# Patient Record
Sex: Female | Born: 1945 | Smoking: Never smoker
Health system: Southern US, Community
[De-identification: ages and names within clinical notes are randomized; demographics above are authoritative.]

## PROBLEM LIST (undated history)

## (undated) DIAGNOSIS — I10 Essential (primary) hypertension: Secondary | ICD-10-CM

## (undated) DIAGNOSIS — R011 Cardiac murmur, unspecified: Secondary | ICD-10-CM

## (undated) DIAGNOSIS — R112 Nausea with vomiting, unspecified: Secondary | ICD-10-CM

## (undated) DIAGNOSIS — B159 Hepatitis A without hepatic coma: Secondary | ICD-10-CM

## (undated) DIAGNOSIS — J309 Allergic rhinitis, unspecified: Secondary | ICD-10-CM

## (undated) DIAGNOSIS — C801 Malignant (primary) neoplasm, unspecified: Secondary | ICD-10-CM

## (undated) DIAGNOSIS — E119 Type 2 diabetes mellitus without complications: Secondary | ICD-10-CM

## (undated) DIAGNOSIS — H269 Unspecified cataract: Secondary | ICD-10-CM

## (undated) DIAGNOSIS — F32A Depression, unspecified: Secondary | ICD-10-CM

## (undated) DIAGNOSIS — M8580 Other specified disorders of bone density and structure, unspecified site: Secondary | ICD-10-CM

## (undated) DIAGNOSIS — E785 Hyperlipidemia, unspecified: Secondary | ICD-10-CM

## (undated) DIAGNOSIS — M1990 Unspecified osteoarthritis, unspecified site: Secondary | ICD-10-CM

## (undated) DIAGNOSIS — M810 Age-related osteoporosis without current pathological fracture: Secondary | ICD-10-CM

## (undated) DIAGNOSIS — Z9889 Other specified postprocedural states: Secondary | ICD-10-CM

## (undated) DIAGNOSIS — D649 Anemia, unspecified: Secondary | ICD-10-CM

## (undated) DIAGNOSIS — B029 Zoster without complications: Secondary | ICD-10-CM

## (undated) DIAGNOSIS — R319 Hematuria, unspecified: Secondary | ICD-10-CM

## (undated) DIAGNOSIS — N952 Postmenopausal atrophic vaginitis: Secondary | ICD-10-CM

## (undated) DIAGNOSIS — T7840XA Allergy, unspecified, initial encounter: Secondary | ICD-10-CM

## (undated) DIAGNOSIS — N393 Stress incontinence (female) (male): Secondary | ICD-10-CM

## (undated) DIAGNOSIS — K219 Gastro-esophageal reflux disease without esophagitis: Secondary | ICD-10-CM

## (undated) DIAGNOSIS — F329 Major depressive disorder, single episode, unspecified: Secondary | ICD-10-CM

## (undated) DIAGNOSIS — F419 Anxiety disorder, unspecified: Secondary | ICD-10-CM

## (undated) DIAGNOSIS — D696 Thrombocytopenia, unspecified: Secondary | ICD-10-CM

## (undated) HISTORY — DX: Unspecified cataract: H26.9

## (undated) HISTORY — PX: KNEE ARTHROSCOPY: SHX127

## (undated) HISTORY — DX: Hyperlipidemia, unspecified: E78.5

## (undated) HISTORY — DX: Anemia, unspecified: D64.9

## (undated) HISTORY — DX: Zoster without complications: B02.9

## (undated) HISTORY — DX: Cardiac murmur, unspecified: R01.1

## (undated) HISTORY — DX: Age-related osteoporosis without current pathological fracture: M81.0

## (undated) HISTORY — DX: Thrombocytopenia, unspecified: D69.6

## (undated) HISTORY — PX: CATARACT EXTRACTION W/ INTRAOCULAR LENS  IMPLANT, BILATERAL: SHX1307

## (undated) HISTORY — PX: UPPER GASTROINTESTINAL ENDOSCOPY: SHX188

## (undated) HISTORY — DX: Allergy, unspecified, initial encounter: T78.40XA

## (undated) HISTORY — DX: Allergic rhinitis, unspecified: J30.9

## (undated) HISTORY — DX: Type 2 diabetes mellitus without complications: E11.9

## (undated) HISTORY — DX: Depression, unspecified: F32.A

## (undated) HISTORY — DX: Major depressive disorder, single episode, unspecified: F32.9

## (undated) HISTORY — DX: Unspecified osteoarthritis, unspecified site: M19.90

## (undated) HISTORY — PX: COLONOSCOPY: SHX174

## (undated) HISTORY — DX: Gastro-esophageal reflux disease without esophagitis: K21.9

## (undated) HISTORY — DX: Hepatitis a without hepatic coma: B15.9

## (undated) HISTORY — DX: Other specified disorders of bone density and structure, unspecified site: M85.80

## (undated) HISTORY — DX: Postmenopausal atrophic vaginitis: N95.2

## (undated) HISTORY — DX: Other specified postprocedural states: Z98.890

## (undated) HISTORY — DX: Anxiety disorder, unspecified: F41.9

## (undated) HISTORY — DX: Stress incontinence (female) (male): N39.3

## (undated) HISTORY — DX: Malignant (primary) neoplasm, unspecified: C80.1

## (undated) HISTORY — DX: Essential (primary) hypertension: I10

## (undated) HISTORY — DX: Other specified postprocedural states: R11.2

## (undated) HISTORY — DX: Nausea with vomiting, unspecified: R11.2

## (undated) HISTORY — DX: Hematuria, unspecified: R31.9

---
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
## 2010-03-12 LAB — HM PAP SMEAR: HM Pap smear: NORMAL

## 2011-10-22 DIAGNOSIS — C44311 Basal cell carcinoma of skin of nose: Secondary | ICD-10-CM | POA: Insufficient documentation

## 2012-09-22 LAB — HM DEXA SCAN

## 2012-12-05 LAB — HM DIABETES EYE EXAM

## 2013-09-29 LAB — HM COLONOSCOPY

## 2013-10-25 LAB — HM MAMMOGRAPHY: HM Mammogram: NORMAL (ref 0–4)

## 2015-04-07 DIAGNOSIS — N952 Postmenopausal atrophic vaginitis: Secondary | ICD-10-CM

## 2015-04-07 DIAGNOSIS — N393 Stress incontinence (female) (male): Secondary | ICD-10-CM

## 2015-04-07 HISTORY — DX: Stress incontinence (female) (male): N39.3

## 2015-04-07 HISTORY — DX: Postmenopausal atrophic vaginitis: N95.2

## 2015-05-09 DIAGNOSIS — R319 Hematuria, unspecified: Secondary | ICD-10-CM

## 2015-05-09 HISTORY — DX: Hematuria, unspecified: R31.9

## 2015-06-04 DIAGNOSIS — B029 Zoster without complications: Secondary | ICD-10-CM

## 2015-06-04 HISTORY — DX: Zoster without complications: B02.9

## 2015-10-14 LAB — HEMOGLOBIN A1C: HEMOGLOBIN A1C: 7.1

## 2016-01-20 ENCOUNTER — Encounter: Payer: Self-pay | Admitting: *Deleted

## 2016-02-04 DIAGNOSIS — E785 Hyperlipidemia, unspecified: Secondary | ICD-10-CM

## 2016-02-04 DIAGNOSIS — E1169 Type 2 diabetes mellitus with other specified complication: Secondary | ICD-10-CM | POA: Insufficient documentation

## 2016-02-04 DIAGNOSIS — F4322 Adjustment disorder with anxiety: Secondary | ICD-10-CM | POA: Insufficient documentation

## 2016-02-04 DIAGNOSIS — E119 Type 2 diabetes mellitus without complications: Secondary | ICD-10-CM | POA: Insufficient documentation

## 2016-02-04 LAB — HEMOGLOBIN A1C: HEMOGLOBIN A1C: 6.4

## 2016-02-05 ENCOUNTER — Encounter: Payer: Self-pay | Admitting: Internal Medicine

## 2016-02-05 ENCOUNTER — Non-Acute Institutional Stay: Payer: Medicare Other | Admitting: Internal Medicine

## 2016-02-05 VITALS — BP 120/70 | HR 71 | Temp 98.0°F | Ht 65.0 in | Wt 136.0 lb

## 2016-02-05 DIAGNOSIS — F4322 Adjustment disorder with anxiety: Secondary | ICD-10-CM

## 2016-02-05 DIAGNOSIS — E785 Hyperlipidemia, unspecified: Secondary | ICD-10-CM | POA: Diagnosis not present

## 2016-02-05 DIAGNOSIS — C44311 Basal cell carcinoma of skin of nose: Secondary | ICD-10-CM

## 2016-02-05 DIAGNOSIS — E119 Type 2 diabetes mellitus without complications: Secondary | ICD-10-CM

## 2016-02-05 DIAGNOSIS — E1169 Type 2 diabetes mellitus with other specified complication: Secondary | ICD-10-CM

## 2016-02-05 MED ORDER — LOSARTAN POTASSIUM 25 MG PO TABS: 25.0000 mg | ORAL_TABLET | Freq: Two times a day (BID) | ORAL | 3 refills | Status: DC

## 2016-02-05 MED ORDER — ATORVASTATIN CALCIUM 20 MG PO TABS: 20.0000 mg | ORAL_TABLET | Freq: Every day | ORAL | 3 refills | Status: DC

## 2016-02-05 MED ORDER — FLUOXETINE HCL 40 MG PO CAPS: 40.0000 mg | ORAL_CAPSULE | Freq: Every day | ORAL | 3 refills | Status: DC
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
## 2016-02-06 ENCOUNTER — Encounter: Payer: Self-pay | Admitting: Internal Medicine

## 2016-02-06 LAB — BASIC METABOLIC PANEL
BUN: 20 mg/dL (ref 4–21)
Creatinine: 0.8 mg/dL (ref 0.5–1.1)
Glucose: 145 mg/dL
Potassium: 5.1 mmol/L (ref 3.4–5.3)
Sodium: 141 mmol/L (ref 137–147)

## 2016-02-06 LAB — LIPID PANEL
CHOLESTEROL: 160 mg/dL (ref 0–200)
HDL: 66 mg/dL (ref 35–70)
LDL CALC: 69 mg/dL
TRIGLYCERIDES: 121 mg/dL (ref 40–160)

## 2016-02-06 LAB — CBC AND DIFFERENTIAL
HEMATOCRIT: 39 % (ref 36–46)
Hemoglobin: 13 g/dL (ref 12.0–16.0)
PLATELETS: 343 10*3/uL (ref 150–399)
WBC: 8.5 10^3/mL

## 2016-02-06 LAB — HEPATIC FUNCTION PANEL
ALK PHOS: 59 U/L (ref 25–125)
ALT: 23 U/L (ref 7–35)
AST: 18 U/L (ref 13–35)

## 2016-02-07 ENCOUNTER — Encounter: Payer: Self-pay | Admitting: *Deleted

## 2016-02-15 ENCOUNTER — Encounter: Payer: Self-pay | Admitting: Internal Medicine

## 2016-05-15 ENCOUNTER — Ambulatory Visit: Payer: Self-pay | Admitting: Internal Medicine

## 2016-05-20 ENCOUNTER — Non-Acute Institutional Stay: Payer: Medicare Other | Admitting: Internal Medicine

## 2016-05-20 ENCOUNTER — Encounter: Payer: Self-pay | Admitting: Internal Medicine

## 2016-05-20 VITALS — BP 126/70 | HR 75 | Temp 98.6°F | Ht 65.0 in | Wt 140.0 lb

## 2016-05-20 DIAGNOSIS — E785 Hyperlipidemia, unspecified: Secondary | ICD-10-CM

## 2016-05-20 DIAGNOSIS — Z1231 Encounter for screening mammogram for malignant neoplasm of breast: Secondary | ICD-10-CM | POA: Diagnosis not present

## 2016-05-20 DIAGNOSIS — E119 Type 2 diabetes mellitus without complications: Secondary | ICD-10-CM | POA: Diagnosis not present

## 2016-05-20 DIAGNOSIS — E1169 Type 2 diabetes mellitus with other specified complication: Secondary | ICD-10-CM

## 2016-05-20 DIAGNOSIS — F4322 Adjustment disorder with anxiety: Secondary | ICD-10-CM | POA: Diagnosis not present

## 2016-05-20 DIAGNOSIS — Z1239 Encounter for other screening for malignant neoplasm of breast: Secondary | ICD-10-CM

## 2016-05-20 DIAGNOSIS — Z0000 Encounter for general adult medical examination without abnormal findings: Secondary | ICD-10-CM | POA: Diagnosis not present

## 2016-05-20 NOTE — Progress Notes (Signed)
Location:  Occupational psychologist of Service:  Clinic (12) Provider: Brysen Shankman L. Mariea Clonts, D.O., C.M.D.  Patient Care Team: Gayland Curry, DO as PCP - General (Geriatric Medicine)  Extended Emergency Contact Information Primary Emergency Contact: Harms,Chris Address: [phone]          [place], [place] [address] Johnnette Litter of [place] Phone: [phone] Relation: Son  Goals of Care: Advanced Directive information Advanced Directives 05/20/2016  Does Patient Have a Medical Advance Directive? Yes  Type of Advance Directive [employer]  Does patient want to make changes to medical advance directive? -  Copy of [place] in Chart? No - copy requested   Chief Complaint  Patient presents with  . Annual Exam    wellness exam, physical exam  . MMSE    30/30 passed clock    HPI: Patient is a 71 y.o. female seen in today for an annual wellness exam and physical.    It's taken a while to adjust to being here.  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    Had an episode last week where her heart started to thump very hard while on her ipad.  Lasted 10 seconds.  Happened again a few days later for a short time and stopped.  Took a xanax and it calmed her down.  No chest pain.  Is sob coming up the last hill.  Is prone to panic attacks.  Says [employer] bothers her often.  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  Last had xanax refilled in May.    Depression screen [facility] 2/9 05/20/2016 02/05/2016  Decreased Interest 0 0  Down, Depressed, Hopeless 0 0  PHQ - 2 Score 0 0    Fall Risk  05/20/2016 02/05/2016  Falls in the past year? No No    Health Maintenance  Topic Date Due  . Hepatitis C Screening  10-28-1945  . FOOT EXAM  10/21/1953  . OPHTHALMOLOGY EXAM  12/05/2013  . MAMMOGRAM  10/26/2015  . HEMOGLOBIN A1C  08/03/2016  . TETANUS/TDAP  04/06/2017  .  COLONOSCOPY  09/30/2023  . INFLUENZA VACCINE  Completed  . DEXA SCAN  Completed  . ZOSTAVAX  Completed  . PNA vac Low Risk Adult  Completed    Functional Status Survey: Is the patient deaf or have difficulty hearing?: No Does the patient have difficulty seeing, even when wearing glasses/contacts?: No (s/p cataract surgery) Does the patient have difficulty concentrating, remembering, or making decisions?: No Does the patient have difficulty walking or climbing stairs?: No Does the patient have difficulty dressing or bathing?: No Does the patient have difficulty doing errands alone such as visiting a doctor's office or shopping?: No Current Exercise Habits: Home exercise routine, Type of exercise: walking, Time (Minutes): > 60, Frequency (Times/Week): 7, Weekly Exercise (Minutes/Week): 0, Intensity: Moderate Exercise limited by: None identified (walking) Diet? diabetic Vision Screening Comments: Cataract surgery last year in [place]  Hearing: no problem Dentition: has had a lot of dental work, but no active problem Pain:  Arthritis pains   Past Medical History:  Diagnosis Date  . Allergic rhinitis   . Anxiety   . Atrophic vaginitis 2017  . Depression   . GERD (gastroesophageal reflux disease)   . Hematuria 05/09/2015   had two nonobstructing renal calculi  . Hepatitis A 1970s  . Hyperlipidemia   .  Osteopenia   . Shingles 06/04/2015  . SUI (stress urinary incontinence, female) 2017  . Thrombocytopenia ([place])     Past Surgical History:  Procedure Laterality Date  . CATARACT EXTRACTION W/ INTRAOCULAR LENS  IMPLANT, BILATERAL Bilateral   . KNEE ARTHROSCOPY  2004, 1990s  . TONSILLECTOMY AND ADENOIDECTOMY  1952    Family History  Problem Relation Age of Onset  . Heart disease Father     heart attack  1980  . Stroke Father     min- stroke  47  . Cancer Brother     colon (2004)    Social History   Social History  . Marital status: Married    Spouse name: N/A  .  Number of children: N/A  . Years of education: N/A   Social History Main Topics  . Smoking status: Never Smoker  . Smokeless tobacco: Never Used  . Alcohol use Yes     Comment: 1-2  . Drug use: Unknown  . Sexual activity: Not Asked   Other Topics Concern  . None   Social History Narrative   Social History     Marital status: Married, 1963           Spouse name: Elenore Rota                      Years of education:                 Number of children:   3              Occupational History     None on file: Admin, Art therapist      Social History Main Topics     Smoking status: Never Smoker                                                                  Smokeless tobacco: Never Used                         Alcohol use: Yes                 Comment: 1-2     Drug use: Not on file      Sexual activity: Not on file            Other Topics            Concern     None on file      Social History Narrative: Low fat & Diabetic diet, lives in a [place] (one story), 2 people and 2 pets( dog and cat), exercise occasional, Has living will, DNR form, POA/HPOA form.     None on file             reports that she has never smoked. She has never used smokeless tobacco. She reports that she drinks alcohol. Her drug history is not on file.  Allergies as of 05/20/2016      Reactions   Keflex [cephalexin]    Sulfa Antibiotics       Medication List       Accurate as of 05/20/16  3:37 PM. Always use your most recent med list.  ALPRAZolam 0.25 MG tablet Commonly known as:  XANAX Cut one 0.25 mg tablet in half as needed for anxiety.   aspirin EC 81 MG tablet Take 81 mg by mouth daily.   atorvastatin 20 MG tablet Commonly known as:  LIPITOR Take 1 tablet (20 mg total) by mouth daily.   FLUoxetine 40 MG capsule Commonly known as:  PROZAC Take 1 capsule (40 mg total) by mouth daily.   ibuprofen 200 MG tablet Commonly known as:  ADVIL,MOTRIN Take 200 mg by mouth daily as  needed for pain.   losartan 25 MG tablet Commonly known as:  COZAAR Take 1 tablet (25 mg total) by mouth 2 (two) times daily.   metFORMIN 500 MG 24 hr tablet Commonly known as:  GLUCOPHAGE-XR Take 1,000 mg by mouth.   omeprazole 20 MG capsule Commonly known as:  PRILOSEC Take 20 mg by mouth daily.   WOMENS 50+ MULTI VITAMIN/MIN PO Take by mouth.       Review of Systems:  Review of Systems  Constitutional: Negative for chills, fever and malaise/fatigue.  HENT: Negative for congestion and hearing loss.   Eyes: Negative for blurred vision.  Respiratory: Negative for cough and shortness of breath.   Cardiovascular: Positive for palpitations. Negative for chest pain, orthopnea, claudication, leg swelling and PND.  Gastrointestinal: Negative for abdominal pain, blood in stool, constipation, diarrhea and melena.  Genitourinary: Negative for dysuria, frequency and urgency.  Musculoskeletal: Positive for joint pain. Negative for falls.  Skin: Negative for itching and rash.  Neurological: Negative for dizziness, tingling, sensory change and loss of consciousness.  Endo/Heme/Allergies: Does not bruise/bleed easily.  Psychiatric/Behavioral: Negative for depression and memory loss. The patient is nervous/anxious. The patient does not have insomnia.     Physical Exam: Vitals:   05/20/16 1436  BP: 126/70  Pulse: 75  Temp: 98.6 F (37 C)  TempSrc: Oral  SpO2: 96%  Weight: 140 lb (63.5 kg)  Height: 5\' 5"  (1.651 m)   Body mass index is 23.3 kg/m. Physical Exam  Constitutional: She is oriented to person, place, and time. She appears well-developed and well-nourished. No distress.  HENT:  Head: Normocephalic and atraumatic.  Right Ear: External ear normal.  Left Ear: External ear normal.  Nose: Nose normal.  Mouth/Throat: Oropharynx is clear and moist. No oropharyngeal exudate.  Eyes: Conjunctivae and EOM are normal. Pupils are equal, round, and reactive to light.  Small  hyperpigmented spot just beneath pupil in right eye  Neck: Normal range of motion. Neck supple. No tracheal deviation present. No thyromegaly present.  Cardiovascular: Normal rate, regular rhythm, normal heart sounds and intact distal pulses.   Pulmonary/Chest: Effort normal and breath sounds normal. No respiratory distress.  Abdominal: Soft. Bowel sounds are normal. She exhibits no distension and no mass. There is no tenderness. There is no rebound and no guarding. No hernia.  Neurological: She is alert and oriented to person, place, and time.  Skin: Skin is warm and dry. Capillary refill takes less than 2 seconds.  Psychiatric: She has a normal mood and affect. Her behavior is normal. Judgment and thought content normal.    Labs reviewed: Basic Metabolic Panel:  Recent Labs  02/06/16  NA 141  K 5.1  BUN 20  CREATININE 0.8   Liver Function Tests:  Recent Labs  02/06/16  AST 18  ALT 23  ALKPHOS 59   No results for input(s): LIPASE, AMYLASE in the last 8760 hours. No results for input(s): AMMONIA in the last  8760 hours. CBC:  Recent Labs  02/06/16  WBC 8.5  HGB 13.0  HCT 39  PLT 343   Lipid Panel:  Recent Labs  02/06/16  CHOL 160  HDL 66  LDLCALC 69  TRIG 121   Lab Results  Component Value Date   HGBA1C 6.4 02/04/2016   Assessment/Plan 1. Annual physical exam - performed today - up to date except may be due for mammo and bone density--she will call back to our office with dates (given Dorothy's name) - MM DIGITAL SCREENING BILATERAL; Future  2. Medicare annual wellness visit, subsequent -performed today, see above  3. Type 2 diabetes mellitus without complication, without long-term current use of insulin (HCC) -well controlled with last hba1c 6.4 at endocrinology--followed there for foot exams and gets regular eye exams also  4. Hyperlipidemia associated with type 2 diabetes mellitus ([place]) -cont statin therapy with lipitor  5. Adjustment disorder with  anxious mood -has increased anxiety since moving and had a couple of spells of palpitations but heart regular on exam -we will call pt when EKG machine at [facility] is repaired so we can perform an EKG  6. Screening for breast cancer - MM DIGITAL SCREENING BILATERAL; Future and may also need her bone density  Finally got her records and will review for scanning.  Labs/tests ordered:   Orders Placed This Encounter  Procedures  . HM MAMMOGRAPHY    This external order was created through the Results Console.  Marland Kitchen HM DEXA SCAN    This external order was created through the Results Console.  . MM DIGITAL SCREENING BILATERAL    Standing Status:   Future    Standing Expiration Date:   07/20/2017    Scheduling Instructions:     Pt to call our office with last mammogram date (we have one from 8/16, but thinks maybe she had one in 2017)    Order Specific Question:   Reason for exam:    Answer:   screening for breast cancer    Order Specific Question:   Preferred imaging location?    Answer:   [facility]  . HM PAP SMEAR    This external order was created through the Results Console.  Marland Kitchen HM DIABETES EYE EXAM    This external order was created through the Results Console.  Marland Kitchen HM COLONOSCOPY    This external order was created through the Results Console.    Next appt:  When EKG machine fixed, then in 6 mos for med mgt  Breannah Kratt L. Ezzard Ditmer, D.O. [facility] [address], [place] [address] Cell Phone (Mon-Fri 8am-5pm):  [phone] On Call:  [phone] & follow prompts after 5pm & weekends Office Phone:  [phone] Office Fax:  [phone]

## 2016-05-20 NOTE — Patient Instructions (Addendum)
Call us with the dates for your mammogram and bone density--Kimberly [name] is our referral coordinator.   We will call you when we have a working EKG machine.

## 2016-05-29 ENCOUNTER — Other Ambulatory Visit: Payer: Self-pay | Admitting: Internal Medicine

## 2016-05-29 DIAGNOSIS — Z1231 Encounter for screening mammogram for malignant neoplasm of breast: Secondary | ICD-10-CM

## 2016-06-19 ENCOUNTER — Encounter: Payer: Self-pay | Admitting: Internal Medicine

## 2016-07-14 ENCOUNTER — Ambulatory Visit
Admission: RE | Admit: 2016-07-14 | Discharge: 2016-07-14 | Disposition: A | Discharge status: Home / Self Care | Payer: Medicare Other | Source: Ambulatory Visit | Attending: Internal Medicine | Admitting: Internal Medicine

## 2016-07-14 DIAGNOSIS — Z1231 Encounter for screening mammogram for malignant neoplasm of breast: Secondary | ICD-10-CM | POA: Diagnosis not present

## 2016-08-03 DIAGNOSIS — E119 Type 2 diabetes mellitus without complications: Secondary | ICD-10-CM | POA: Diagnosis not present

## 2016-08-03 DIAGNOSIS — E785 Hyperlipidemia, unspecified: Secondary | ICD-10-CM | POA: Diagnosis not present

## 2016-08-03 DIAGNOSIS — R030 Elevated blood-pressure reading, without diagnosis of hypertension: Secondary | ICD-10-CM | POA: Diagnosis not present

## 2016-08-03 DIAGNOSIS — E1169 Type 2 diabetes mellitus with other specified complication: Secondary | ICD-10-CM | POA: Diagnosis not present

## 2016-09-25 DIAGNOSIS — H26491 Other secondary cataract, right eye: Secondary | ICD-10-CM | POA: Diagnosis not present

## 2016-09-25 DIAGNOSIS — E119 Type 2 diabetes mellitus without complications: Secondary | ICD-10-CM | POA: Diagnosis not present

## 2016-09-25 DIAGNOSIS — H26492 Other secondary cataract, left eye: Secondary | ICD-10-CM | POA: Diagnosis not present

## 2016-11-18 ENCOUNTER — Non-Acute Institutional Stay: Payer: Medicare Other | Admitting: Internal Medicine

## 2016-11-18 ENCOUNTER — Encounter: Payer: Self-pay | Admitting: Internal Medicine

## 2016-11-18 VITALS — BP 150/78 | HR 80 | Temp 98.0°F | Wt 139.0 lb

## 2016-11-18 DIAGNOSIS — R457 State of emotional shock and stress, unspecified: Secondary | ICD-10-CM

## 2016-11-18 DIAGNOSIS — F4389 Other reactions to severe stress: Secondary | ICD-10-CM

## 2016-11-18 DIAGNOSIS — I10 Essential (primary) hypertension: Secondary | ICD-10-CM

## 2016-11-18 DIAGNOSIS — F4322 Adjustment disorder with anxiety: Secondary | ICD-10-CM | POA: Diagnosis not present

## 2016-11-18 MED ORDER — LOSARTAN POTASSIUM 25 MG PO TABS: ORAL_TABLET | ORAL | 3 refills | Status: DC

## 2016-11-18 NOTE — Progress Notes (Signed)
Location:  [place] of Service:  Clinic (12)  Provider: Lamichael Youkhana L. Mariea Clonts, D.O., C.M.D.  Code Status: DNR Goals of Care:  Advanced Directives 05/20/2016  Does Patient Have a Medical Advance Directive? Yes  Type of Advance Directive [place]  Does patient want to make changes to medical advance directive? -  Copy of [place] in Chart? No - copy requested     Chief Complaint  Patient presents with  . Acute Visit    discuss blood pressure    HPI: Patient is a 71 y.o. female seen today for an acute visit for stress and elevated BP.  She reports her husband is very demanding.  He's just fallen and broken his hip beneath the prosthesis.  Her stomach is in knots.  She's taking her alprazolam more and she does not want to take it b/c it makes her groggy.  She took a whole one yesterday.  She has asked her sons to take over.  She is tired.  She is crying all of the time.  She reports starting off sad and worried about him, and then her husband's way of treating her has been frustrating--he puts her down in front of others and says nasty things to her.  She is worried about what the Parkinson's is doing to his body and how he can't exercise.  Her BP was high yesterday to 180/100.  She was taking it more and more and it kept going up.  They have not yet joined a support group for Parkinson's.  She can't do anything on her own w/o him wanting to come along.  She wants to do things on her own sometimes.  She tells me she had a fall at the Parkinson's walk and another time heat stroke and he is not at all understanding.  Takes losartan 25mg  twice a day.  Took once at noon yesterday.    She was doing three days a week with Shirlean Mylar and daily walks with the dog.    Past Medical History:  Diagnosis Date  . Allergic rhinitis   . Anxiety   . Atrophic vaginitis 2017  . Depression   . GERD (gastroesophageal reflux disease)   . Hematuria 05/09/2015   had  two nonobstructing renal calculi  . Hepatitis A 1970s  . Hyperlipidemia   . Osteopenia   . Shingles 06/04/2015  . SUI (stress urinary incontinence, female) 2017  . Thrombocytopenia ([place])     Past Surgical History:  Procedure Laterality Date  . CATARACT EXTRACTION W/ INTRAOCULAR LENS  IMPLANT, BILATERAL Bilateral   . KNEE ARTHROSCOPY  2004, 1990s  . TONSILLECTOMY AND ADENOIDECTOMY  1952    Allergies  Allergen Reactions  . Keflex [Cephalexin]   . Sulfa Antibiotics     Allergies as of 11/18/2016      Reactions   Keflex [cephalexin]    Sulfa Antibiotics       Medication List       Accurate as of 11/18/16  3:03 PM. Always use your most recent med list.          ALPRAZolam 0.25 MG tablet Commonly known as:  XANAX Cut one 0.25 mg tablet in half as needed for anxiety.   aspirin EC 81 MG tablet Take 81 mg by mouth daily.   atorvastatin 20 MG tablet Commonly known as:  LIPITOR Take 1 tablet (20 mg total) by mouth daily.   FLUoxetine 40 MG capsule Commonly known as:  PROZAC Take 1 capsule (40 mg total) by mouth daily.   ibuprofen 200 MG tablet Commonly known as:  ADVIL,MOTRIN Take 200 mg by mouth daily as needed for pain.   losartan 25 MG tablet Commonly known as:  COZAAR Take 1 tablet (25 mg total) by mouth 2 (two) times daily.   metFORMIN 500 MG 24 hr tablet Commonly known as:  GLUCOPHAGE-XR Take 1,000 mg by mouth.   omeprazole 20 MG capsule Commonly known as:  PRILOSEC Take 20 mg by mouth daily.   WOMENS 50+ MULTI VITAMIN/MIN PO Take by mouth.       Review of Systems:  Review of Systems  Constitutional: Positive for malaise/fatigue. Negative for chills and fever.  HENT: Negative for congestion.   Eyes: Negative for blurred vision.  Respiratory: Negative for shortness of breath.   Cardiovascular: Negative for chest pain and palpitations.  Gastrointestinal: Negative for abdominal pain.  Genitourinary: Negative for dysuria.  Musculoskeletal:  Negative for falls.  Neurological: Negative for dizziness, loss of consciousness and weakness.  Psychiatric/Behavioral: Positive for depression. Negative for hallucinations, memory loss, substance abuse and suicidal ideas. The patient is nervous/anxious and has insomnia.     Health Maintenance  Topic Date Due  . Hepatitis C Screening  16-Oct-1945  . FOOT EXAM  10/[birth year]/1953  . OPHTHALMOLOGY EXAM  12/05/2013  . HEMOGLOBIN A1C  08/03/2016  . INFLUENZA VACCINE  11/04/2016  . TETANUS/TDAP  04/06/2017  . MAMMOGRAM  07/15/2018  . COLONOSCOPY  09/30/2023  . DEXA SCAN  Completed  . PNA vac Low Risk Adult  Completed    Physical Exam: Vitals:   11/18/16 1436  BP: (!) 150/78  Pulse: 80  Temp: 98 F (36.7 C)  TempSrc: Oral  SpO2: 98%  Weight: 139 lb (63 kg)   Body mass index is 23.13 kg/m. Physical Exam  Constitutional: She is oriented to person, place, and time. She appears well-developed and well-nourished. No distress.  Cardiovascular: Normal rate, regular rhythm and normal heart sounds.   Pulmonary/Chest: Effort normal and breath sounds normal. No respiratory distress.  Abdominal: Bowel sounds are normal.  Musculoskeletal: Normal range of motion.  Neurological: She is alert and oriented to person, place, and time.  Skin: Skin is warm and dry. Capillary refill takes less than 2 seconds.  Psychiatric:  Crying during visit, very stressed and upset    Labs reviewed: Basic Metabolic Panel:  Recent Labs  02/06/16  NA 141  K 5.1  BUN 20  CREATININE 0.8   Liver Function Tests:  Recent Labs  02/06/16  AST 18  ALT 23  ALKPHOS 59   No results for input(s): LIPASE, AMYLASE in the last 8760 hours. No results for input(s): AMMONIA in the last 8760 hours. CBC:  Recent Labs  02/06/16  WBC 8.5  HGB 13.0  HCT 39  PLT 343   Lipid Panel:  Recent Labs  02/06/16  CHOL 160  HDL 66  LDLCALC 69  TRIG 121   Lab Results  Component Value Date   HGBA1C 6.4 02/04/2016     Assessment/Plan 1. Uncontrolled hypertension Increase losartan to 50mg  in am, 25mg  in pm (was on 25mg  po bid) Let me know if staying up over 150/90 when checked 1 hr after taking morning pills - losartan (COZAAR) 25 MG tablet; Two tablets in the morning and one tablet in the evening  Dispense: 270 tablet; Refill: 3  2. Caregiver stress syndrome Parkinson's caregiver support group--sent message to Dr. Carles Collet for info and will call  pt with it when received Katy Fitch for counseling here  3. Adjustment disorder with anxious mood Parkinson's caregiver support group Katy Fitch for counseling here  Labs/tests ordered:  No new Next appt:  Call and come for routine visit as scheduled  Giovonnie Trettel L. Mikel Hardgrove, D.O. [facility] [address], [place] [address] Cell Phone (Mon-Fri 8am-5pm):  [phone] On Call:  [phone] & follow prompts after 5pm & weekends Office Phone:  [phone] Office Fax:  [phone]

## 2016-11-19 ENCOUNTER — Telehealth: Payer: Self-pay | Admitting: *Deleted

## 2016-11-19 NOTE — Telephone Encounter (Signed)
-----   Message from Gayland Curry, DO sent at 11/19/2016  8:11 AM EDT ----- Please call Mrs Stipes and let her know that there is a caregiver support group that will likely start in October.  Please provide the contact name below for her--Amy Gerrit Friends.  ----- Message ----- From: Ludwig Clarks, DO Sent: 11/19/2016   7:31 AM To: Gayland Curry, DO  The caregiver support group meets in conjunction with the regular one but generally is done every other month.  Last month, we lost our PD social worker who was running that but we hired a new one who actually used to help with the caregiver support group and she is going to be great!  She is set to start 9/10 but suspect we won't get her started with groups until October.  [facility] but I don't think caregiver group will meet until we get the new social worker.  Once our social worker starts though, she will be offering counseling too.  Amy Marriott at the [facility] runs the PD support groups (she is on epic and responds to staff message and email) and I'm sure that Mrs. Bryant is familiar with her.  I hate that he broke his hip again!  I saw that he was messing with his catheter and was cringing when I read then notes!!  Have a great day! Wells Guiles ----- Message ----- From: Gayland Curry, DO Sent: 11/18/2016   3:25 PM To: Eustace Quail Tat, DO  Hi Joellyn Haff just seen Mr. Motz' wife.  She is overwhelmed and frustrated after his second fall where he broke his left hip that had just been repaired.  He is in [facility] rehab.  She is interested in a Parkinson's caregiver support group.  Are you able to send me the contact info for this or ask your staff to send it to me or Mrs. Kollmann?     Thanks!     Tiffany

## 2016-11-19 NOTE — Telephone Encounter (Signed)
Spoke with patient and advised results, she will get more information and attend.

## 2016-11-22 DIAGNOSIS — I10 Essential (primary) hypertension: Secondary | ICD-10-CM | POA: Insufficient documentation

## 2016-11-22 DIAGNOSIS — R457 State of emotional shock and stress, unspecified: Secondary | ICD-10-CM | POA: Insufficient documentation

## 2016-11-23 DIAGNOSIS — L570 Actinic keratosis: Secondary | ICD-10-CM | POA: Diagnosis not present

## 2016-11-23 DIAGNOSIS — L821 Other seborrheic keratosis: Secondary | ICD-10-CM | POA: Diagnosis not present

## 2016-11-23 DIAGNOSIS — D229 Melanocytic nevi, unspecified: Secondary | ICD-10-CM | POA: Diagnosis not present

## 2016-11-30 ENCOUNTER — Telehealth: Payer: Self-pay | Admitting: *Deleted

## 2016-11-30 NOTE — Telephone Encounter (Signed)
Patient called and stated that she needs a prior authorization for her Losartan. Initiated Cover My Meds. And authorization determination 48-72 hours.  [id number]

## 2016-11-30 NOTE — Telephone Encounter (Signed)
Received fax from [place]. Losartan was APPROVED through 04/05/2017

## 2017-01-18 ENCOUNTER — Other Ambulatory Visit: Payer: Self-pay | Admitting: Internal Medicine

## 2017-02-02 DIAGNOSIS — E1169 Type 2 diabetes mellitus with other specified complication: Secondary | ICD-10-CM | POA: Diagnosis not present

## 2017-02-02 DIAGNOSIS — I10 Essential (primary) hypertension: Secondary | ICD-10-CM | POA: Diagnosis not present

## 2017-02-02 DIAGNOSIS — E785 Hyperlipidemia, unspecified: Secondary | ICD-10-CM | POA: Diagnosis not present

## 2017-03-10 ENCOUNTER — Other Ambulatory Visit: Payer: Self-pay | Admitting: Internal Medicine

## 2017-03-10 NOTE — Telephone Encounter (Signed)
It doesn't look like we have ever filled this before ok to fill?

## 2017-04-27 ENCOUNTER — Telehealth: Payer: Self-pay | Admitting: Internal Medicine

## 2017-04-27 NOTE — Telephone Encounter (Signed)
Left message asking pt to call me at [phone] to schedule AWV-S at [facility] clinic on 05/25/17 if she's available. Last AWV 05/20/16. VDM (DD)

## 2017-05-03 NOTE — Telephone Encounter (Signed)
I called the patient to schedule AWV w/ Clarise Cruz on 05/25/17.  She stated that they will be in [place], so I told her I would call back to schedule on another day that Clarise Cruz will be there.  She declined coming into the [facility] office. VDM (DD)

## 2017-06-29 ENCOUNTER — Other Ambulatory Visit: Payer: Self-pay | Admitting: Internal Medicine

## 2017-08-03 NOTE — Progress Notes (Signed)
ERROR

## 2017-08-04 ENCOUNTER — Other Ambulatory Visit: Payer: Self-pay | Admitting: *Deleted

## 2017-08-04 MED ORDER — ALPRAZOLAM 0.25 MG PO TABS: ORAL_TABLET | ORAL | 0 refills | Status: DC

## 2017-08-04 NOTE — Telephone Encounter (Signed)
Received mail order request from [facility] for 90 day supply of Alprazolam. Patient is currently in [place] Verified LR: 03/10/17

## 2017-08-04 NOTE — Telephone Encounter (Signed)
Ok to refill, but pt will need appt with me when they return from [facility].  She has not been seen since august of last year.

## 2017-08-23 ENCOUNTER — Other Ambulatory Visit: Payer: Self-pay | Admitting: Internal Medicine

## 2017-08-23 ENCOUNTER — Telehealth: Payer: Self-pay | Admitting: Internal Medicine

## 2017-08-23 DIAGNOSIS — Z1231 Encounter for screening mammogram for malignant neoplasm of breast: Secondary | ICD-10-CM

## 2017-08-23 NOTE — Telephone Encounter (Signed)
I left a message asking the pt to call me at [phone] to schedule AWV with Sara at [facility] on 09/21/17 if available. VDM (DD) °

## 2017-09-06 ENCOUNTER — Telehealth: Payer: Self-pay | Admitting: *Deleted

## 2017-09-06 NOTE — Telephone Encounter (Signed)
Cbc with diff, cmp, hba1c, fasting lipids for diabetes and hyperlipidemia

## 2017-09-06 NOTE — Telephone Encounter (Signed)
Patient calling asking for labs to be drawn at [facility], pt was seeing her endocrinologist and will now start seeing you. Please advise on what labs she can have?

## 2017-09-06 NOTE — Telephone Encounter (Signed)
Spoke with patient and advised lab ordered faxed to [facility] and she can have labs at 8am 09/07/17

## 2017-09-07 ENCOUNTER — Encounter: Payer: Self-pay | Admitting: Internal Medicine

## 2017-09-07 DIAGNOSIS — E785 Hyperlipidemia, unspecified: Secondary | ICD-10-CM | POA: Diagnosis not present

## 2017-09-07 DIAGNOSIS — E1110 Type 2 diabetes mellitus with ketoacidosis without coma: Secondary | ICD-10-CM | POA: Diagnosis not present

## 2017-09-07 DIAGNOSIS — E119 Type 2 diabetes mellitus without complications: Secondary | ICD-10-CM | POA: Diagnosis not present

## 2017-09-07 DIAGNOSIS — I10 Essential (primary) hypertension: Secondary | ICD-10-CM | POA: Diagnosis not present

## 2017-09-07 DIAGNOSIS — D649 Anemia, unspecified: Secondary | ICD-10-CM | POA: Diagnosis not present

## 2017-09-07 LAB — HEMOGLOBIN A1C: Hemoglobin A1C: 7.9

## 2017-09-07 LAB — CBC AND DIFFERENTIAL
HCT: 38 (ref 36–46)
Hemoglobin: 13 (ref 12.0–16.0)
Platelets: 355 (ref 150–399)
WBC: 6.2

## 2017-09-07 LAB — HEPATIC FUNCTION PANEL
ALT: 25 (ref 7–35)
AST: 22 (ref 13–35)
Alkaline Phosphatase: 67 (ref 25–125)
Bilirubin, Total: 0.4

## 2017-09-07 LAB — LIPID PANEL
Cholesterol: 135 (ref 0–200)
HDL: 52 (ref 35–70)
LDL Cholesterol: 56
Triglycerides: 138 (ref 40–160)

## 2017-09-07 LAB — BASIC METABOLIC PANEL
BUN: 17 (ref 4–21)
Creatinine: 0.8 (ref 0.5–1.1)
Glucose: 176
Potassium: 4.7 (ref 3.4–5.3)
Sodium: 141 (ref 137–147)

## 2017-09-09 ENCOUNTER — Encounter: Payer: Self-pay | Admitting: Internal Medicine

## 2017-09-15 ENCOUNTER — Encounter: Payer: Self-pay | Admitting: Internal Medicine

## 2017-09-15 ENCOUNTER — Non-Acute Institutional Stay: Payer: Medicare Other | Admitting: Internal Medicine

## 2017-09-15 VITALS — BP 128/80 | HR 81 | Temp 98.0°F | Ht 65.0 in | Wt 139.0 lb

## 2017-09-15 DIAGNOSIS — I10 Essential (primary) hypertension: Secondary | ICD-10-CM | POA: Diagnosis not present

## 2017-09-15 DIAGNOSIS — E1169 Type 2 diabetes mellitus with other specified complication: Secondary | ICD-10-CM | POA: Diagnosis not present

## 2017-09-15 DIAGNOSIS — B372 Candidiasis of skin and nail: Secondary | ICD-10-CM

## 2017-09-15 DIAGNOSIS — F4322 Adjustment disorder with anxiety: Secondary | ICD-10-CM | POA: Diagnosis not present

## 2017-09-15 DIAGNOSIS — R457 State of emotional shock and stress, unspecified: Secondary | ICD-10-CM

## 2017-09-15 DIAGNOSIS — E785 Hyperlipidemia, unspecified: Secondary | ICD-10-CM | POA: Diagnosis not present

## 2017-09-15 MED ORDER — KETOCONAZOLE 2 % EX CREA: 1.0000 "application " | TOPICAL_CREAM | Freq: Two times a day (BID) | CUTANEOUS | 3 refills | Status: DC | PRN

## 2017-09-15 MED ORDER — EMPAGLIFLOZIN 10 MG PO TABS: 10.0000 mg | ORAL_TABLET | Freq: Every day | ORAL | 0 refills | Status: DC

## 2017-09-15 NOTE — Progress Notes (Signed)
Location:  [facility] of Service:  Clinic (12)  Provider: Shemeka Wardle L. Mariea Clonts, D.O., C.M.D.  Code Status: DNR Goals of Care:  Advanced Directives 05/20/2016  Does Patient Have a Medical Advance Directive? Yes  Type of Advance Directive [place]  Does patient want to make changes to medical advance directive? -  Copy of [place] in Chart? No - copy requested     Chief Complaint  Patient presents with  . Medical Management of Chronic Issues    follow-up, no longer seeing endocrinologist    HPI: Patient is a 72 y.o. female seen today for medical management of chronic diseases.    She has stopped seeing endocrine and wants me to follow her diabetes.  DMII:  hba1c was 7.9 8 days ago.  The last one I have on file is 6.4 from October of 2017.  She is only on metformin 1000mg  daily.  On cozaar 25mg  po daily.  She was very worried about her sugar.  She's not been checking her blood sugar b/c she's following Don around worrying about him.  She knows that if she lost weight, it would come down.  She's not getting exercise except to walk the dog a 1/4 mile in the morning.  She's not motivated to do anything.   She is not eating right so she'll have things she wants that are not good for her.    BP at goal today.  Anxiety remains severe in relation to her husband's parkinson's disease, her caregiver stress.  (He had a fall dumping out the trash bag in the garage last evening and had a large laceration on his left temple and she was very upset that it could not be sutured here.  It happened over a day ago.  Nursing had already dressed it.)  She is on prozac 40mg  po daily and xanax 0.25mg   1/2 tab daily as needed for anxiety.  Her husband is delegating responsibilities to her that she never did.  She is attending the support group.  She has quit playing the piano which was such a part of her here.    Hyperlipidemia:  On lipitor 20mg   daily. LDL is at goal of <70 (at 69).    GERD:  On omeprazole 20mg  po daily.  Not bad on this with omeprazole.  She has had yeast in the skinfolds, not vaginal yeast infections.   Past Medical History:  Diagnosis Date  . Allergic rhinitis   . Anxiety   . Atrophic vaginitis 2017  . Depression   . GERD (gastroesophageal reflux disease)   . Hematuria 05/09/2015   had two nonobstructing renal calculi  . Hepatitis A 1970s  . Hyperlipidemia   . Osteopenia   . Shingles 06/04/2015  . SUI (stress urinary incontinence, female) 2017  . Thrombocytopenia ([place])     Past Surgical History:  Procedure Laterality Date  . CATARACT EXTRACTION W/ INTRAOCULAR LENS  IMPLANT, BILATERAL Bilateral   . KNEE ARTHROSCOPY  2004, 1990s  . TONSILLECTOMY AND ADENOIDECTOMY  1952    Allergies  Allergen Reactions  . Keflex [Cephalexin]   . Sulfa Antibiotics     Outpatient Encounter Medications as of 09/15/2017  Medication Sig  . ALPRAZolam (XANAX) 0.25 MG tablet TAKE 1/2 TABLET BY MOUTH EVERY DAY AS NEEDED FOR ANXIETY  . atorvastatin (LIPITOR) 20 MG tablet TAKE 1 TABLET BY MOUTH  DAILY  . FLUoxetine (PROZAC) 40 MG capsule TAKE 1 CAPSULE BY  MOUTH  DAILY  . losartan (COZAAR) 25 MG tablet Two tablets in the morning and one tablet in the evening  . Multiple Vitamins-Minerals (WOMENS 50+ MULTI VITAMIN/MIN PO) Take by mouth.  Marland Kitchen omeprazole (PRILOSEC) 20 MG capsule Take 20 mg by mouth daily.  . metFORMIN (GLUCOPHAGE-XR) 500 MG 24 hr tablet Take 1,000 mg by mouth.  . [DISCONTINUED] aspirin EC 81 MG tablet Take 81 mg by mouth daily.  . [DISCONTINUED] ibuprofen (ADVIL,MOTRIN) 200 MG tablet Take 200 mg by mouth daily as needed for pain.   No facility-administered encounter medications on file as of 09/15/2017.     Review of Systems:  Review of Systems  Constitutional: Negative for chills, fever and malaise/fatigue.  HENT: Negative for congestion.   Eyes: Negative for blurred vision.  Respiratory: Negative for  cough and shortness of breath.   Cardiovascular: Negative for chest pain, palpitations and leg swelling.  Gastrointestinal: Negative for abdominal pain, blood in stool, constipation and melena.  Genitourinary: Negative for dysuria.  Musculoskeletal: Negative for falls.  Skin:       Yeast in skin folds  Neurological: Negative for dizziness and loss of consciousness.  Endo/Heme/Allergies: Does not bruise/bleed easily.       Diabetes control worsened  Psychiatric/Behavioral: Positive for depression. Negative for memory loss and suicidal ideas. The patient is nervous/anxious. The patient does not have insomnia.     Health Maintenance  Topic Date Due  . Hepatitis C Screening  1946-01-09  . FOOT EXAM  10/21/1955  . OPHTHALMOLOGY EXAM  12/05/2013  . TETANUS/TDAP  04/06/2017  . INFLUENZA VACCINE  11/04/2017  . HEMOGLOBIN A1C  03/09/2018  . MAMMOGRAM  07/15/2018  . COLONOSCOPY  09/30/2023  . DEXA SCAN  Completed  . PNA vac Low Risk Adult  Completed    Physical Exam: Vitals:   09/15/17 0852  BP: 128/80  Pulse: 81  Temp: 98 F (36.7 C)  TempSrc: Oral  SpO2: 97%  Weight: 139 lb (63 kg)  Height: 5\' 5"  (1.651 m)   Body mass index is 23.13 kg/m. Physical Exam  Constitutional: She is oriented to person, place, and time. She appears well-developed.  Cardiovascular: Normal rate, regular rhythm, normal heart sounds and intact distal pulses.  Pulmonary/Chest: Effort normal and breath sounds normal. No respiratory distress.  Abdominal: Bowel sounds are normal.  Musculoskeletal: Normal range of motion.  Neurological: She is alert and oriented to person, place, and time.  Skin: Skin is warm and dry.  Psychiatric:  Tearful throughout visit    Labs reviewed: Basic Metabolic Panel: Recent Labs    09/07/17 0700  NA 141  K 4.7  BUN 17  CREATININE 0.8   Liver Function Tests: Recent Labs    09/07/17 0700  AST 22  ALT 25  ALKPHOS 67   No results for input(s): LIPASE, AMYLASE  in the last 8760 hours. No results for input(s): AMMONIA in the last 8760 hours. CBC: Recent Labs    09/07/17 0700  WBC 6.2  HGB 13.0  HCT 38  PLT 355   Lipid Panel: Recent Labs    09/07/17 0700  CHOL 135  HDL 52  LDLCALC 56  TRIG 138   Lab Results  Component Value Date   HGBA1C 7.9 09/07/2017    Assessment/Plan 1. Type 2 diabetes mellitus with other specified complication, without long-term current use of insulin (HCC) - cont metformin 1000mg  po bid  - empagliflozin (JARDIANCE) 10 MG TABS tablet; Take 10 mg by mouth daily.  Dispense:  30 tablet; Refill: 0--if she tolerates this, she is to call back in 1-2 weeks to let me know so we can send it to her mail order pharmacy before she runs out -was educated on hydration with it to prevent UTIs and dehydration due to diuretic property - work on dietary changes and increasing exercise  2. Hyperlipidemia associated with type 2 diabetes mellitus ([place]) -cont lipitor and work on diet and exercise  3. Caregiver stress syndrome -getting worse, needs respite, she's overwhelmed   4. Adjustment disorder with anxious mood -she did not want to change two medications at once so we continued the prozac and xanax at this point; would consider zoloft or lexapro for her or maybe viibryd  5. Essential hypertension -bp at goal today, cont losartan  6. Yeast dermatitis - ketoconazole (NIZORAL) 2 % cream; Apply 1 application topically 2 (two) times daily as needed (groin rash).  Dispense: 30 g; Refill: 3  Labs/tests ordered:  Hba1c, bmp before Next appt:  12/22/2017 f/u dm and mood  Adriel Kessen L. Lakeishia Truluck, D.O. [facility] [address], [place] [address] Cell Phone (Mon-Fri 8am-5pm):  [phone] On Call:  [phone] & follow prompts after 5pm & weekends Office Phone:  [phone] Office Fax:  [phone]

## 2017-09-17 ENCOUNTER — Ambulatory Visit
Admission: RE | Admit: 2017-09-17 | Discharge: 2017-09-17 | Disposition: A | Discharge status: Home / Self Care | Payer: Medicare Other | Source: Ambulatory Visit | Attending: Internal Medicine | Admitting: Internal Medicine

## 2017-09-17 DIAGNOSIS — Z1231 Encounter for screening mammogram for malignant neoplasm of breast: Secondary | ICD-10-CM

## 2017-09-20 ENCOUNTER — Encounter: Payer: Self-pay | Admitting: *Deleted

## 2017-09-20 ENCOUNTER — Other Ambulatory Visit: Payer: Self-pay | Admitting: Internal Medicine

## 2017-09-20 DIAGNOSIS — I10 Essential (primary) hypertension: Secondary | ICD-10-CM

## 2017-09-21 ENCOUNTER — Non-Acute Institutional Stay: Payer: Medicare Other

## 2017-09-21 ENCOUNTER — Other Ambulatory Visit: Payer: Self-pay | Admitting: Internal Medicine

## 2017-09-21 VITALS — BP 118/78 | HR 99 | Temp 98.1°F | Ht 65.0 in | Wt 135.0 lb

## 2017-09-21 DIAGNOSIS — Z0000 Encounter for general adult medical examination without abnormal findings: Secondary | ICD-10-CM

## 2017-09-21 DIAGNOSIS — E1169 Type 2 diabetes mellitus with other specified complication: Secondary | ICD-10-CM

## 2017-09-21 MED ORDER — METFORMIN HCL ER (MOD) 1000 MG PO TB24: 1000.0000 mg | ORAL_TABLET | Freq: Two times a day (BID) | ORAL | 3 refills | Status: DC

## 2017-09-21 NOTE — Patient Instructions (Signed)
Kimberly [name] , Thank you for taking time to come for your Medicare Wellness Visit. I appreciate your ongoing commitment to your health goals. Please review the following plan we discussed and let me know if I can assist you in the future.   Screening recommendations/referrals: Colonoscopy up to date, due 09/30/2018 Mammogram up to date, due 09/2018 Bone Density up to date Recommended yearly ophthalmology/optometry visit for glaucoma screening and checkup Recommended yearly dental visit for hygiene and checkup  Vaccinations: Influenza vaccine due 2019 fall season Pneumococcal vaccine up to date, completed Tdap vaccine due, ordered to pharmacy Shingles vaccine due, ordered to pharmacy    Advanced directives: In chart  Conditions/risks identified: none  Next appointment: Dr. Mariea Clonts 12/22/2017 @ 9am   Preventive Care 65 Years and Older, Female Preventive care refers to lifestyle choices and visits with your health care provider that can promote health and wellness. What does preventive care include?  A yearly physical exam. This is also called an annual well check.  Dental exams once or twice a year.  Routine eye exams. Ask your health care provider how often you should have your eyes checked.  Personal lifestyle choices, including:  Daily care of your teeth and gums.  Regular physical activity.  Eating a healthy diet.  Avoiding tobacco and drug use.  Limiting alcohol use.  Practicing safe sex.  Taking low-dose aspirin every day.  Taking vitamin and mineral supplements as recommended by your health care provider. What happens during an annual well check? The services and screenings done by your health care provider during your annual well check will depend on your age, overall health, lifestyle risk factors, and family history of disease. Counseling  Your health care provider may ask you questions about your:  Alcohol use.  Tobacco use.  Drug use.  Emotional  well-being.  Home and relationship well-being.  Sexual activity.  Eating habits.  History of falls.  Memory and ability to understand (cognition).  Work and work Statistician.  Reproductive health. Screening  You may have the following tests or measurements:  Height, weight, and BMI.  Blood pressure.  Lipid and cholesterol levels. These may be checked every 5 years, or more frequently if you are over 28 years old.  Skin check.  Lung cancer screening. You may have this screening every year starting at age 45 if you have a 30-pack-year history of smoking and currently smoke or have quit within the past 15 years.  Fecal occult blood test (FOBT) of the stool. You may have this test every year starting at age 39.  Flexible sigmoidoscopy or colonoscopy. You may have a sigmoidoscopy every 5 years or a colonoscopy every 10 years starting at age 69.  Hepatitis C blood test.  Hepatitis B blood test.  Sexually transmitted disease (STD) testing.  Diabetes screening. This is done by checking your blood sugar (glucose) after you have not eaten for a while (fasting). You may have this done every 1-3 years.  Bone density scan. This is done to screen for osteoporosis. You may have this done starting at age 75.  Mammogram. This may be done every 1-2 years. Talk to your health care provider about how often you should have regular mammograms. Talk with your health care provider about your test results, treatment options, and if necessary, the need for more tests. Vaccines  Your health care provider may recommend certain vaccines, such as:  Influenza vaccine. This is recommended every year.  Tetanus, diphtheria, and acellular pertussis (Tdap, Td)  vaccine. You may need a Td booster every 10 years.  Zoster vaccine. You may need this after age 42.  Pneumococcal 13-valent conjugate (PCV13) vaccine. One dose is recommended after age 4.  Pneumococcal polysaccharide (PPSV23) vaccine. One  dose is recommended after age 2. Talk to your health care provider about which screenings and vaccines you need and how often you need them. This information is not intended to replace advice given to you by your health care provider. Make sure you discuss any questions you have with your health care provider. Document Released: 04/19/2015 Document Revised: 12/11/2015 Document Reviewed: 01/22/2015 Elsevier Interactive Patient Education  2017 [employer] Prevention in the Home Falls can cause injuries. They can happen to people of all ages. There are many things you can do to make your home safe and to help prevent falls. What can I do on the outside of my home?  Regularly fix the edges of walkways and driveways and fix any cracks.  Remove anything that might make you trip as you walk through a door, such as a raised step or threshold.  Trim any bushes or trees on the path to your home.  Use bright outdoor lighting.  Clear any walking paths of anything that might make someone trip, such as rocks or tools.  Regularly check to see if handrails are loose or broken. Make sure that both sides of any steps have handrails.  Any raised decks and porches should have guardrails on the edges.  Have any leaves, snow, or ice cleared regularly.  Use sand or salt on walking paths during winter.  Clean up any spills in your garage right away. This includes oil or grease spills. What can I do in the bathroom?  Use night lights.  Install grab bars by the toilet and in the tub and shower. Do not use towel bars as grab bars.  Use non-skid mats or decals in the tub or shower.  If you need to sit down in the shower, use a plastic, non-slip stool.  Keep the floor dry. Clean up any water that spills on the floor as soon as it happens.  Remove soap buildup in the tub or shower regularly.  Attach bath mats securely with double-sided non-slip rug tape.  Do not have throw rugs and other  things on the floor that can make you trip. What can I do in the bedroom?  Use night lights.  Make sure that you have a light by your bed that is easy to reach.  Do not use any sheets or blankets that are too big for your bed. They should not hang down onto the floor.  Have a firm chair that has side arms. You can use this for support while you get dressed.  Do not have throw rugs and other things on the floor that can make you trip. What can I do in the kitchen?  Clean up any spills right away.  Avoid walking on wet floors.  Keep items that you use a lot in easy-to-reach places.  If you need to reach something above you, use a strong step stool that has a grab bar.  Keep electrical cords out of the way.  Do not use floor polish or wax that makes floors slippery. If you must use wax, use non-skid floor wax.  Do not have throw rugs and other things on the floor that can make you trip. What can I do with my stairs?  Do not leave  any items on the stairs.  Make sure that there are handrails on both sides of the stairs and use them. Fix handrails that are broken or loose. Make sure that handrails are as long as the stairways.  Check any carpeting to make sure that it is firmly attached to the stairs. Fix any carpet that is loose or worn.  Avoid having throw rugs at the top or bottom of the stairs. If you do have throw rugs, attach them to the floor with carpet tape.  Make sure that you have a light switch at the top of the stairs and the bottom of the stairs. If you do not have them, ask someone to add them for you. What else can I do to help prevent falls?  Wear shoes that:  Do not have high heels.  Have rubber bottoms.  Are comfortable and fit you well.  Are closed at the toe. Do not wear sandals.  If you use a stepladder:  Make sure that it is fully opened. Do not climb a closed stepladder.  Make sure that both sides of the stepladder are locked into place.  Ask  someone to hold it for you, if possible.  Clearly mark and make sure that you can see:  Any grab bars or handrails.  First and last steps.  Where the edge of each step is.  Use tools that help you move around (mobility aids) if they are needed. These include:  Canes.  Walkers.  Scooters.  Crutches.  Turn on the lights when you go into a dark area. Replace any light bulbs as soon as they burn out.  Set up your furniture so you have a clear path. Avoid moving your furniture around.  If any of your floors are uneven, fix them.  If there are any pets around you, be aware of where they are.  Review your medicines with your doctor. Some medicines can make you feel dizzy. This can increase your chance of falling. Ask your doctor what other things that you can do to help prevent falls. This information is not intended to replace advice given to you by your health care provider. Make sure you discuss any questions you have with your health care provider. Document Released: 01/17/2009 Document Revised: 08/29/2015 Document Reviewed: 04/27/2014 Elsevier Interactive Patient Education  2017 [employer].

## 2017-09-21 NOTE — Progress Notes (Addendum)
Subjective:   Kimberly [name] is a 72 y.o. female who presents for Medicare Annual (Subsequent) preventive examination at [facility]  Last AWV-05/20/2016       Objective:     Vitals: BP 118/78 (BP Location: Left Arm, Patient Position: Sitting)   Pulse 99   Temp 98.1 F (36.7 C) (Oral)   Ht 5\' 5"  (1.651 m)   Wt 135 lb (61.2 kg)   SpO2 95%   BMI 22.47 kg/m   Body mass index is 22.47 kg/m.  Advanced Directives 09/21/2017 05/20/2016 02/05/2016  Does Patient Have a Medical Advance Directive? Yes Yes Yes  Type of Paramedic of [place];Living will Healthcare Power of [place];Living will;Out of facility DNR (pink MOST or yellow form)  Does patient want to make changes to medical advance directive? No - Patient declined - No - Patient declined  Copy of [place] in Chart? Yes No - copy requested No - copy requested    Tobacco Social History   Tobacco Use  Smoking Status Never Smoker  Smokeless Tobacco Never Used     Counseling given: Not Answered   Clinical Intake:  Pre-visit preparation completed: No  Pain : No/denies pain     Nutritional Risks: None Diabetes: Yes CBG done?: No Did pt. bring in CBG monitor from home?: No  How often do you need to have someone help you when you read instructions, pamphlets, or other written materials from your doctor or pharmacy?: 1 - Never What is the last grade level you completed in school?: High School  Interpreter Needed?: No  Information entered by :: Tyson Dense, RN  Past Medical History:  Diagnosis Date  . Allergic rhinitis   . Anxiety   . Atrophic vaginitis 2017  . Depression   . GERD (gastroesophageal reflux disease)   . Hematuria 05/09/2015   had two nonobstructing renal calculi  . Hepatitis A 1970s  . Hyperlipidemia   . Osteopenia   . Shingles 06/04/2015  . SUI (stress urinary incontinence, female) 2017  .  Thrombocytopenia ([place])    Past Surgical History:  Procedure Laterality Date  . CATARACT EXTRACTION W/ INTRAOCULAR LENS  IMPLANT, BILATERAL Bilateral   . KNEE ARTHROSCOPY  2004, 1990s  . TONSILLECTOMY AND ADENOIDECTOMY  1952   Family History  Problem Relation Age of Onset  . Heart disease Father        heart attack  1980  . Stroke Father        min- stroke  20  . Cancer Brother        colon (2004)   Social History   Socioeconomic History  . Marital status: Married    Spouse name: Not on file  . Number of children: Not on file  . Years of education: Not on file  . Highest education level: Not on file  Occupational History  . Not on file  Social Needs  . Financial resource strain: Not hard at all  . Food insecurity:    Worry: Never true    Inability: Never true  . Transportation needs:    Medical: No    Non-medical: No  Tobacco Use  . Smoking status: Never Smoker  . Smokeless tobacco: Never Used  Substance and Sexual Activity  . Alcohol use: Never    Frequency: Never  . Drug use: Not on file  . Sexual activity: Not on file  Lifestyle  . Physical activity:  Days per week: 7 days    Minutes per session: 40 min  . Stress: Rather much  Relationships  . Social connections:    Talks on phone: More than three times a week    Gets together: More than three times a week    Attends religious service: Never    Active member of club or organization: No    Attends meetings of clubs or organizations: Never    Relationship status: Married  Other Topics Concern  . Not on file  Social History Narrative   Social History     Marital status: Married, 1963           Spouse name: Elenore Rota                      Years of education:                 Number of children:   3              Occupational History     None on file: Admin, Music Teacher      Social History Main Topics     Smoking status: Never Smoker                                                                   Smokeless tobacco: Never Used                         Alcohol use: Yes                 Comment: 1-2     Drug use: Not on file      Sexual activity: Not on file            Other Topics            Concern     None on file      Social History Narrative: Low fat & Diabetic diet, lives in a [place] (one story), 2 people and 2 pets( dog and cat), exercise occasional, Has living will, DNR form, POA/HPOA form.     None on file          Outpatient Encounter Medications as of 09/21/2017  Medication Sig  . ALPRAZolam (XANAX) 0.25 MG tablet TAKE 1/2 TABLET BY MOUTH EVERY DAY AS NEEDED FOR ANXIETY  . atorvastatin (LIPITOR) 20 MG tablet TAKE 1 TABLET BY MOUTH  DAILY  . FLUoxetine (PROZAC) 40 MG capsule TAKE 1 CAPSULE BY MOUTH  DAILY  . ketoconazole (NIZORAL) 2 % cream Apply 1 application topically 2 (two) times daily as needed (groin rash).  Marland Kitchen losartan (COZAAR) 25 MG tablet TAKE 2 TABLETS BY MOUTH IN  THE MORNING AND 1 TABLET IN THE EVENING  . Multiple Vitamins-Minerals (WOMENS 50+ MULTI VITAMIN/MIN PO) Take by mouth.  . empagliflozin (JARDIANCE) 10 MG TABS tablet Take 10 mg by mouth daily.  . metFORMIN (GLUCOPHAGE-XR) 500 MG 24 hr tablet Take 1,000 mg by mouth 2 (two) times daily.  Marland Kitchen omeprazole (PRILOSEC) 20 MG capsule Take 20 mg by mouth daily.   No facility-administered encounter medications on file as of 09/21/2017.     Activities of Daily Living In your present state  of health, do you have any difficulty performing the following activities: 09/21/2017  Hearing? N  Vision? N  Difficulty concentrating or making decisions? N  Walking or climbing stairs? N  Dressing or bathing? N  Doing errands, shopping? N  Preparing Food and eating ? N  Using the Toilet? N  Managing your Medications? N  Managing your Finances? N  Housekeeping or managing your Housekeeping? N  Some recent data might be hidden    Patient Care Team: Gayland Curry, DO as PCP - General (Geriatric Medicine)      Assessment:   This is a routine wellness examination for Kathia.  Exercise Activities and Dietary recommendations Current Exercise Habits: Structured exercise class, Type of exercise: Other - see comments;walking(chair fit), Time (Minutes): 30, Frequency (Times/Week): 3, Weekly Exercise (Minutes/Week): 90, Intensity: Mild, Exercise limited by: None identified  Goals    . Exercise 3x per week (30 min per time)     Patient will start going to the chair fit class 3 times a week       Fall Risk Fall Risk  09/21/2017 09/15/2017 11/18/2016 05/20/2016 02/05/2016  Falls in the past year? Yes No No No No  Number falls in past yr: 1 - - - -  Injury with Fall? No - - - -   Is the patient's home free of loose throw rugs in walkways, pet beds, electrical cords, etc?   yes      Grab bars in the bathroom? yes      Handrails on the stairs?   yes      Adequate lighting?   yes  Timed Get Up and Go performed: 12 seconds  Depression Screen PHQ 2/9 Scores 09/21/2017 09/15/2017 11/18/2016 05/20/2016  PHQ - 2 Score 4 0 0 0  PHQ- 9 Score 8 - - -     Cognitive Function MMSE - Mini Mental State Exam 09/21/2017 05/20/2016  Orientation to time 5 5  Orientation to Place 5 5  Registration 3 3  Attention/ Calculation 5 5  Recall 3 3  Language- name 2 objects 2 2  Language- repeat 1 1  Language- follow 3 step command 3 3  Language- read & follow direction 1 1  Write a sentence 1 1  Copy design 1 1  Total score 30 30        Immunization History  Administered Date(s) Administered  . Influenza,inj,Quad PF,6+ Mos 02/04/2016  . Pneumococcal Conjugate-13 12/20/2013  . Pneumococcal Polysaccharide-23 08/01/2007  . Pneumococcal-Unspecified 07/31/2009  . Tdap 04/07/2007  . Zoster 04/07/2007, 07/31/2009    Qualifies for Shingles Vaccine? Yes, educated and ordered  Screening Tests Health Maintenance  Topic Date Due  . Hepatitis C Screening  1946/01/08  . FOOT EXAM  10/21/1955  . OPHTHALMOLOGY EXAM   12/05/2013  . TETANUS/TDAP  04/06/2017  . INFLUENZA VACCINE  11/04/2017  . HEMOGLOBIN A1C  03/09/2018  . MAMMOGRAM  09/18/2019  . COLONOSCOPY  09/30/2023  . DEXA SCAN  Completed  . PNA vac Low Risk Adult  Completed    Cancer Screenings: Lung: Low Dose CT Chest recommended if Age 36-80 years, 30 pack-year currently smoking OR have quit w/in 15years. Patient does not qualify. Breast:  Up to date on Mammogram? Yes   Up to date of Bone Density/Dexa? Yes Colorectal: up to date  Additional Screenings:  Hepatitis C Screening: declined TDAP due-ordered Patient stated she had her eye exam less than a year ago but is going to make her next one  soon     Plan:    I have personally reviewed and addressed the Medicare Annual Wellness questionnaire and have noted the following in the patient's chart:  A. Medical and social history B. Use of alcohol, tobacco or illicit drugs  C. Current medications and supplements D. Functional ability and status E.  Nutritional status F.  Physical activity G. Advance directives H. List of other physicians I.  Hospitalizations, surgeries, and ER visits in previous 12 months J.  [place] to include hearing, vision, cognitive, depression L. Referrals and appointments - none  In addition, I have reviewed and discussed with patient certain preventive protocols, quality metrics, and best practice recommendations. A written personalized care plan for preventive services as well as general preventive health recommendations were provided to patient.  See attached scanned questionnaire for additional information.   Signed,   Tyson Dense, RN Nurse Health Advisor  Patient Concerns: None

## 2017-09-29 ENCOUNTER — Other Ambulatory Visit: Payer: Self-pay | Admitting: *Deleted

## 2017-09-29 MED ORDER — METFORMIN HCL 1000 MG PO TABS: 1000.0000 mg | ORAL_TABLET | Freq: Two times a day (BID) | ORAL | 3 refills | Status: DC

## 2017-10-01 ENCOUNTER — Telehealth: Payer: Self-pay | Admitting: *Deleted

## 2017-10-01 NOTE — Telephone Encounter (Signed)
Calling pt to be sure she got the correct metformin after the incorrect metformin was ordered. Per pt yes she got it. Pt also states she is not taking jardiance she is doing lifestyle changes instead, per pt she's on the KETO diet and has already lost 6 pounds and her blood pressure in going down. Just a FYI to Dr. Mariea Clonts.

## 2017-10-01 NOTE — Telephone Encounter (Signed)
Noted.  (We had discussed the keto diet at the visit and I advised against it b/c it would be hard to maintain and might actually increase her cholesterol though helping her sugar.  Gllad she's lost some weight, but a bit concerned that it might be coming off too fast to last.  We'll see.)

## 2017-10-12 ENCOUNTER — Other Ambulatory Visit: Payer: Self-pay | Admitting: Internal Medicine

## 2017-10-12 DIAGNOSIS — E1169 Type 2 diabetes mellitus with other specified complication: Secondary | ICD-10-CM

## 2017-10-28 ENCOUNTER — Other Ambulatory Visit: Payer: Self-pay | Admitting: Internal Medicine

## 2017-12-06 ENCOUNTER — Other Ambulatory Visit: Payer: Self-pay | Admitting: Internal Medicine

## 2017-12-06 DIAGNOSIS — I10 Essential (primary) hypertension: Secondary | ICD-10-CM

## 2017-12-07 NOTE — Telephone Encounter (Signed)
Dr. Mariea Clonts Kimberly [name], want this sent to [facility]. Database checked and verified

## 2017-12-14 DIAGNOSIS — E119 Type 2 diabetes mellitus without complications: Secondary | ICD-10-CM | POA: Diagnosis not present

## 2017-12-14 DIAGNOSIS — D649 Anemia, unspecified: Secondary | ICD-10-CM | POA: Diagnosis not present

## 2017-12-14 DIAGNOSIS — I10 Essential (primary) hypertension: Secondary | ICD-10-CM | POA: Diagnosis not present

## 2017-12-14 LAB — BASIC METABOLIC PANEL
BUN: 19 (ref 4–21)
Creatinine: 0.8 (ref 0.5–1.1)
Glucose: 118
Potassium: 4.4 (ref 3.4–5.3)
Sodium: 140 (ref 137–147)

## 2017-12-14 LAB — HEMOGLOBIN A1C: Hemoglobin A1C: 7.2

## 2017-12-17 ENCOUNTER — Encounter: Payer: Self-pay | Admitting: Internal Medicine

## 2017-12-22 ENCOUNTER — Encounter: Payer: Self-pay | Admitting: Internal Medicine

## 2017-12-22 ENCOUNTER — Non-Acute Institutional Stay: Payer: Medicare Other | Admitting: Internal Medicine

## 2017-12-22 VITALS — BP 130/80 | HR 72 | Temp 98.5°F | Ht 65.0 in | Wt 133.0 lb

## 2017-12-22 DIAGNOSIS — I10 Essential (primary) hypertension: Secondary | ICD-10-CM | POA: Diagnosis not present

## 2017-12-22 DIAGNOSIS — E119 Type 2 diabetes mellitus without complications: Secondary | ICD-10-CM

## 2017-12-22 DIAGNOSIS — E1169 Type 2 diabetes mellitus with other specified complication: Secondary | ICD-10-CM

## 2017-12-22 DIAGNOSIS — F4322 Adjustment disorder with anxiety: Secondary | ICD-10-CM | POA: Diagnosis not present

## 2017-12-22 DIAGNOSIS — R457 State of emotional shock and stress, unspecified: Secondary | ICD-10-CM

## 2017-12-22 DIAGNOSIS — E785 Hyperlipidemia, unspecified: Secondary | ICD-10-CM

## 2017-12-22 NOTE — Progress Notes (Signed)
Location:  Occupational psychologist of Service:  Clinic (12)  Provider: [place] Rogel L. Mariea Clonts, D.O., C.M.D.  Goals of Care:  Advanced Directives 09/21/2017  Does Patient Have a Medical Advance Directive? Yes  Type of Paramedic of [place];Living will  Does patient want to make changes to medical advance directive? No - Patient declined  Copy of [place] in Chart? Yes    Chief Complaint  Patient presents with  . Medical Management of Chronic Issues    21mth follow-up    HPI: Patient is a 72 y.o. female seen today for medical management of chronic diseases.    She has not started jardiance, but has done the keto diet.  She's lost almost 10 lbs.  Was 131 lbs.  Her goal is 125 lbs.  She did go off the keto diet b/c it was messing up her stomach.  Probiotics messed her up, too.  She's walking each morning 1 mile with the dog.  Wants to get back into chair2 with Robin.    Her husband is going to have back surgery.  He just had an episode of passing out on Monday and that was reported to Dr. Carles Collet.  She's ordered an EEG for him.    Her sons are very supportive.  Her husband was worrying about the will last night and it stresses her out.    She's been playing for the [place] series here.    Hardly ever uses the xanax but she just a new order of it.    Has been avoiding bread and potatoes.  Eating only sugar-free cakes.   Breakfast--egg and two slices of prebaked bacon and occasionally a piece of whole wheat toast Lunch--salad or PB on crackers Dinner--avoids beef, stays with veggies and makes herself eat fish, sometimes chicken. Has been making keto bread, noodles, etc.  Using almond flour, etc.   Uses splenda in her tea.   She's working on Financial planner with water.   Does like oatmeal--steel cut.  Does have blueberries at home.    Has to [facility] app[facility] [address]--and derm appt. Went to dentist 3 wks ago.     Had lifeline screening.    Blood pressure is much better with her three pills a day.    Last cholesterol was good three months ago.  Past Medical History:  Diagnosis Date  . Allergic rhinitis   . Anxiety   . Atrophic vaginitis 2017  . Depression   . GERD (gastroesophageal reflux disease)   . Hematuria 05/09/2015   had two nonobstructing renal calculi  . Hepatitis A 1970s  . Hyperlipidemia   . Osteopenia   . Shingles 06/04/2015  . SUI (stress urinary incontinence, female) 2017  . Thrombocytopenia ([place])     Past Surgical History:  Procedure Laterality Date  . CATARACT EXTRACTION W/ INTRAOCULAR LENS  IMPLANT, BILATERAL Bilateral   . KNEE ARTHROSCOPY  2004, 1990s  . TONSILLECTOMY AND ADENOIDECTOMY  1952    Allergies  Allergen Reactions  . Keflex [Cephalexin]   . Sulfa Antibiotics     Outpatient Encounter Medications as of 12/22/2017  Medication Sig  . ALPRAZolam (XANAX) 0.25 MG tablet Take 0.5 tablets (0.125 mg total) by mouth daily as needed for anxiety.  Marland Kitchen atorvastatin (LIPITOR) 20 MG tablet TAKE 1 TABLET BY MOUTH  DAILY  . FLUoxetine (PROZAC) 40 MG capsule TAKE 1 CAPSULE BY MOUTH  DAILY  . ketoconazole (NIZORAL) 2 % cream Apply 1  application topically 2 (two) times daily as needed (groin rash).  Marland Kitchen losartan (COZAAR) 25 MG tablet TAKE 2 TABLETS BY MOUTH IN  THE MORNING AND 1 TABLET IN THE EVENING  . metFORMIN (GLUCOPHAGE) 1000 MG tablet Take 1 tablet (1,000 mg total) by mouth 2 (two) times daily with a meal.  . Multiple Vitamins-Minerals (WOMENS 50+ MULTI VITAMIN/MIN PO) Take by mouth.  Marland Kitchen omeprazole (PRILOSEC) 20 MG capsule Take 20 mg by mouth daily.  . [DISCONTINUED] JARDIANCE 10 MG TABS tablet TAKE 1 TABLET BY MOUTH EVERY DAY   No facility-administered encounter medications on file as of 12/22/2017.     Review of Systems:  Review of Systems  Constitutional: Positive for weight loss. Negative for chills, fever and malaise/fatigue.       Intentional  HENT:  Negative for congestion.   Eyes: Negative for blurred vision.  Respiratory: Negative for cough and shortness of breath.   Cardiovascular: Negative for chest pain and palpitations.  Gastrointestinal: Negative for abdominal pain.  Genitourinary: Negative for dysuria.  Musculoskeletal: Negative for falls and joint pain.  Skin: Negative for itching and rash.  Neurological: Negative for dizziness and seizures.  Psychiatric/Behavioral: Negative for depression and memory loss. The patient is nervous/anxious. The patient does not have insomnia.     Health Maintenance  Topic Date Due  . Hepatitis C Screening  [birth year]  . FOOT EXAM  10/21/1955  . OPHTHALMOLOGY EXAM  12/05/2013  . TETANUS/TDAP  04/06/2017  . INFLUENZA VACCINE  11/04/2017  . HEMOGLOBIN A1C  06/14/2018  . MAMMOGRAM  09/18/2019  . COLONOSCOPY  09/30/2023  . DEXA SCAN  Completed  . PNA vac Low Risk Adult  Completed    Physical Exam: Vitals:   12/22/17 0900  BP: 130/80  Pulse: 72  Temp: 98.5 F (36.9 C)  TempSrc: Oral  SpO2: 98%  Weight: 133 lb (60.3 kg)  Height: 5\' 5"  (1.651 m)   Body mass index is 22.13 kg/m. Physical Exam  Constitutional: She is oriented to person, place, and time. She appears well-developed and well-nourished. No distress.  HENT:  Head: Normocephalic and atraumatic.  Cardiovascular: Normal rate, regular rhythm, normal heart sounds and intact distal pulses.  Pulmonary/Chest: Effort normal and breath sounds normal. No respiratory distress.  Abdominal: Bowel sounds are normal.  Musculoskeletal: Normal range of motion.  Neurological: She is alert and oriented to person, place, and time.  Skin: Skin is warm and dry.  Psychiatric: She has a normal mood and affect. Her behavior is normal. Judgment and thought content normal.  anxious    Labs reviewed: Basic Metabolic Panel: Recent Labs    09/07/17 0700 12/14/17 0600  NA 141 140  K 4.7 4.4  BUN 17 19  CREATININE 0.8 0.8   Liver Function  Tests: Recent Labs    09/07/17 0700  AST 22  ALT 25  ALKPHOS 67   No results for input(s): LIPASE, AMYLASE in the last 8760 hours. No results for input(s): AMMONIA in the last 8760 hours. CBC: Recent Labs    09/07/17  WBC 6.2  HGB 13.0  HCT 38  PLT 355   Lipid Panel: Recent Labs    09/07/17  CHOL 135  HDL 52  LDLCALC 56  TRIG 138   Lab Results  Component Value Date   HGBA1C 7.2 12/14/2017   Assessment/Plan 1. Type 2 diabetes mellitus without complication, without long-term current use of insulin (HCC) -cont metformin, is not taking jardiance, but has made some significant dietary changes and  is exercising consistently -encouraged her and congratulated her on her tremendous progress  2. Hyperlipidemia associated with type 2 diabetes mellitus ([place]) -cont lipitor therapy, ldl at goal last check  3. Adjustment disorder with anxious mood -doing better since exercising and changing her perspective, cont rare prn xanax and regular prozac therapy  4. Essential hypertension -bp at goal, cont losartan therapy  5. Caregiver stress syndrome -ongoing with husband's PD, has been attending some of the relevant support programs   Labs/tests ordered:  Bmp, hba1c before Next appt:  04/20/2018 med [place]. Lamaj Metoyer, D.O. [facility] [address], [place] [address] Cell Phone (Mon-Fri 8am-5pm):  [phone] On Call:  [phone] & follow prompts after 5pm & weekends Office Phone:  [phone] Office Fax:  [phone]

## 2018-02-15 DIAGNOSIS — H26491 Other secondary cataract, right eye: Secondary | ICD-10-CM | POA: Diagnosis not present

## 2018-02-15 DIAGNOSIS — E119 Type 2 diabetes mellitus without complications: Secondary | ICD-10-CM | POA: Diagnosis not present

## 2018-03-01 ENCOUNTER — Encounter: Payer: Self-pay | Admitting: Nurse Practitioner

## 2018-03-01 ENCOUNTER — Encounter: Payer: Self-pay | Admitting: Internal Medicine

## 2018-03-01 ENCOUNTER — Ambulatory Visit (INDEPENDENT_AMBULATORY_CARE_PROVIDER_SITE_OTHER): Payer: Medicare Other | Admitting: Nurse Practitioner

## 2018-03-01 VITALS — BP 160/84 | HR 92 | Temp 98.0°F | Ht 65.0 in | Wt 138.0 lb

## 2018-03-01 DIAGNOSIS — M5441 Lumbago with sciatica, right side: Secondary | ICD-10-CM

## 2018-03-01 DIAGNOSIS — M5442 Lumbago with sciatica, left side: Secondary | ICD-10-CM | POA: Diagnosis not present

## 2018-03-01 DIAGNOSIS — E119 Type 2 diabetes mellitus without complications: Secondary | ICD-10-CM | POA: Diagnosis not present

## 2018-03-01 DIAGNOSIS — L821 Other seborrheic keratosis: Secondary | ICD-10-CM | POA: Diagnosis not present

## 2018-03-01 DIAGNOSIS — D229 Melanocytic nevi, unspecified: Secondary | ICD-10-CM | POA: Diagnosis not present

## 2018-03-01 MED ORDER — PREDNISONE 10 MG (21) PO TBPK: ORAL_TABLET | ORAL | 0 refills | Status: DC

## 2018-03-01 MED ORDER — EMPAGLIFLOZIN 10 MG PO TABS: 10.0000 mg | ORAL_TABLET | Freq: Every day | ORAL | Status: DC

## 2018-03-01 NOTE — Patient Instructions (Signed)
To use heating pad to lower back twice daily  To use muscle rub AFTER heat (biofreeze, benegay, Aspercreme (with lidocaine), icyhot) as needed Can use tylenol 500 mg 2 tablets every 8 hours as needed pain Prednisone titration pack as directed  Will get physical therapy- request it to be [facility] however if not we will notify you  Continue dietary modification- prednisone will make you have a "false" hungry  To start Jardiance and continue to monitor blood sugars   Keep follow up with Dr Mariea Clonts, follow up sooner if needed

## 2018-03-01 NOTE — Progress Notes (Signed)
Careteam: Patient Care Team: Gayland Curry, DO as PCP - General (Geriatric Medicine)  Advanced Directive information Does Patient Have a Medical Advance Directive?: Yes, Type of Advance Directive: [place];Living will  Allergies  Allergen Reactions  . Keflex [Cephalexin]   . Sulfa Antibiotics     Chief Complaint  Patient presents with  . Acute Visit    Pt is having lower back pain that shoots down both buttocks into her legs. Pain has been present for 2 months but has gotten more intense in the last few days.      HPI: Patient is a 72 y.o. female seen in the office today due to back pain.  Never had pain like this before. Started 2 months ago. No injury but helping her husband who has had 3 surgery in 2 years and having to lift him.  More intense in the last few days. Had company and was walking barefoot on her floors for days.  Able to sleep but when she wakes up in the morning it is bad 10/10 this morning. Reports she was in tears it hurt so bad. Hurts to move hips and legs but she makes herself get up and go and it gets better throughout the day. Low back pain radiates through bilateral hips and legs.  Anxious which makes it worse Takes alprazolam which helps pain but makes her loopy Taking ibuprofen/motrin/aleve which has not been very effective. Has been making her blood sugar up.  No loss of bowel or bladder function  DM- pt is on metformin 1000 mg BID and was advised to take Jardiance but did not because of potential side effects and therefore made extreme dietary modifications but does not feel like she is going to be able to keep up with this. "I ate basically nothing" and having a hard time functioning. Fasting blood sugars recently around 160s.    Review of Systems:  Review of Systems  Constitutional: Negative for chills, fever and malaise/fatigue.  Eyes: Negative for blurred vision.  Respiratory: Negative for cough and shortness of breath.    Gastrointestinal: Negative for abdominal pain.  Genitourinary: Negative for dysuria.  Musculoskeletal: Positive for back pain. Negative for falls and joint pain.  Skin: Negative for itching and rash.  Neurological: Negative for dizziness, sensory change, seizures, weakness and headaches.  Psychiatric/Behavioral: Negative for depression. The patient is nervous/anxious.     Past Medical History:  Diagnosis Date  . Allergic rhinitis   . Anxiety   . Atrophic vaginitis 2017  . Depression   . GERD (gastroesophageal reflux disease)   . Hematuria 05/09/2015   had two nonobstructing renal calculi  . Hepatitis A 1970s  . Hyperlipidemia   . Osteopenia   . Shingles 06/04/2015  . SUI (stress urinary incontinence, female) 2017  . Thrombocytopenia ([place])    Past Surgical History:  Procedure Laterality Date  . CATARACT EXTRACTION W/ INTRAOCULAR LENS  IMPLANT, BILATERAL Bilateral   . KNEE ARTHROSCOPY  2004, 1990s  . TONSILLECTOMY AND ADENOIDECTOMY  1952   Social History:   reports that she has never smoked. She has never used smokeless tobacco. She reports that she does not drink alcohol. Her drug history is not on file.  Family History  Problem Relation Age of Onset  . Heart disease Father        heart attack  1980  . Stroke Father        min- stroke  9  . Cancer Brother  colon (2004)    Medications: Patient's Medications  New Prescriptions   No medications on file  Previous Medications   ALPRAZOLAM (XANAX) 0.25 MG TABLET    Take 0.5 tablets (0.125 mg total) by mouth daily as needed for anxiety.   ATORVASTATIN (LIPITOR) 20 MG TABLET    TAKE 1 TABLET BY MOUTH  DAILY   FLUOXETINE (PROZAC) 40 MG CAPSULE    TAKE 1 CAPSULE BY MOUTH  DAILY   KETOCONAZOLE (NIZORAL) 2 % CREAM    Apply 1 application topically 2 (two) times daily as needed (groin rash).   LOSARTAN (COZAAR) 25 MG TABLET    TAKE 2 TABLETS BY MOUTH IN  THE MORNING AND 1 TABLET IN THE EVENING   METFORMIN  (GLUCOPHAGE) 1000 MG TABLET    Take 1 tablet (1,000 mg total) by mouth 2 (two) times daily with a meal.   MULTIPLE VITAMINS-MINERALS (WOMENS 50+ MULTI VITAMIN/MIN PO)    Take by mouth.   OMEPRAZOLE (PRILOSEC) 20 MG CAPSULE    Take 20 mg by mouth daily.  Modified Medications   No medications on file  Discontinued Medications   No medications on file     Physical Exam:  Vitals:   03/01/18 1318  BP: (!) 160/84  Pulse: 92  Temp: 98 F (36.7 C)  TempSrc: Oral  SpO2: 98%  Weight: 138 lb (62.6 kg)  Height: 5\' 5"  (1.651 m)   Body mass index is 22.96 kg/m.  Physical Exam  Constitutional: She is oriented to person, place, and time. She appears well-developed and well-nourished. No distress.  HENT:  Head: Normocephalic and atraumatic.  Cardiovascular: Normal rate, regular rhythm, normal heart sounds and intact distal pulses.  Pulmonary/Chest: Effort normal and breath sounds normal. No respiratory distress.  Musculoskeletal: Normal range of motion. She exhibits no edema or tenderness.       Lumbar back: She exhibits normal range of motion, no tenderness and no bony tenderness.  Neurological: She is alert and oriented to person, place, and time.  Skin: Skin is warm and dry.  Psychiatric: She has a normal mood and affect. Her behavior is normal. Judgment and thought content normal.  anxious    Labs reviewed: Basic Metabolic Panel: Recent Labs    09/07/17 0700 12/14/17  NA 141 140  K 4.7 4.4  BUN 17 19  CREATININE 0.8 0.8   Liver Function Tests: Recent Labs    09/07/17 0700  AST 22  ALT 25  ALKPHOS 67   No results for input(s): LIPASE, AMYLASE in the last 8760 hours. No results for input(s): AMMONIA in the last 8760 hours. CBC: Recent Labs    09/07/17  WBC 6.2  HGB 13.0  HCT 38  PLT 355   Lipid Panel: Recent Labs    09/07/17  CHOL 135  HDL 52  LDLCALC 56  TRIG 138   TSH: No results for input(s): TSH in the last 8760 hours. A1C: Lab Results  Component  Value Date   HGBA1C 7.2 12/14/2017     Assessment/Plan 1. Type 2 diabetes mellitus without complication, without long-term current use of insulin (HCC) -blood sugars have been elevated and unable to maintain current diet modifications.  -advised that prednisone will make you have a "false" hungry sensation.    To start Jardiance and continue to monitor blood sugars  - empagliflozin (JARDIANCE) 10 MG TABS tablet; Take 10 mg by mouth daily.  Dispense: 30 tablet  2. Acute bilateral low back pain with bilateral sciatica To use  heating pad to lower back twice daily  To use muscle rub AFTER heat (biofreeze, benegay, Aspercreme (with lidocaine), icyhot) as needed Can use tylenol 500 mg 2 tablets every 8 hours as needed pain Prednisone titration pack as directed - predniSONE (STERAPRED UNI-PAK 21 TAB) 10 MG (21) TBPK tablet; Use as directed  Dispense: 21 tablet; Refill: 0 - Ambulatory referral to Physical Therapy  Next appt: 04/20/2018 with Dr Mariea Clonts, sooner if needed [place] [place], [facility] [phone]

## 2018-03-02 ENCOUNTER — Telehealth: Payer: Self-pay | Admitting: *Deleted

## 2018-03-02 DIAGNOSIS — M5442 Lumbago with sciatica, left side: Secondary | ICD-10-CM

## 2018-03-02 NOTE — Telephone Encounter (Signed)
Pt's husband walked into office asking that pt's referral be changed to  [facility]. Per verbal from Dr. Mariea Clonts ok to change. Lattie Haw can you cancel the other referral to [facility], please.

## 2018-03-03 ENCOUNTER — Encounter: Payer: Self-pay | Admitting: Nurse Practitioner

## 2018-03-11 DIAGNOSIS — M545 Low back pain: Secondary | ICD-10-CM | POA: Diagnosis not present

## 2018-03-11 DIAGNOSIS — M5442 Lumbago with sciatica, left side: Secondary | ICD-10-CM | POA: Diagnosis not present

## 2018-03-11 DIAGNOSIS — R293 Abnormal posture: Secondary | ICD-10-CM | POA: Diagnosis not present

## 2018-03-11 DIAGNOSIS — M5441 Lumbago with sciatica, right side: Secondary | ICD-10-CM | POA: Diagnosis not present

## 2018-03-14 ENCOUNTER — Other Ambulatory Visit: Payer: Self-pay | Admitting: *Deleted

## 2018-03-14 DIAGNOSIS — E119 Type 2 diabetes mellitus without complications: Secondary | ICD-10-CM

## 2018-03-14 MED ORDER — EMPAGLIFLOZIN 10 MG PO TABS: ORAL_TABLET | ORAL | 1 refills | Status: DC

## 2018-03-14 NOTE — Telephone Encounter (Signed)
[facility] 

## 2018-03-15 DIAGNOSIS — M545 Low back pain: Secondary | ICD-10-CM | POA: Diagnosis not present

## 2018-03-15 DIAGNOSIS — M5442 Lumbago with sciatica, left side: Secondary | ICD-10-CM | POA: Diagnosis not present

## 2018-03-15 DIAGNOSIS — R293 Abnormal posture: Secondary | ICD-10-CM | POA: Diagnosis not present

## 2018-03-15 DIAGNOSIS — M5441 Lumbago with sciatica, right side: Secondary | ICD-10-CM | POA: Diagnosis not present

## 2018-03-21 ENCOUNTER — Ambulatory Visit: Payer: Medicare Other | Admitting: Physical Therapy

## 2018-03-21 ENCOUNTER — Other Ambulatory Visit: Payer: Self-pay

## 2018-03-21 ENCOUNTER — Ambulatory Visit: Payer: Medicare Other | Attending: Nurse Practitioner | Admitting: Physical Therapy

## 2018-03-21 ENCOUNTER — Encounter: Payer: Self-pay | Admitting: Physical Therapy

## 2018-03-21 DIAGNOSIS — G8929 Other chronic pain: Secondary | ICD-10-CM | POA: Diagnosis not present

## 2018-03-21 DIAGNOSIS — M6281 Muscle weakness (generalized): Secondary | ICD-10-CM | POA: Diagnosis not present

## 2018-03-21 DIAGNOSIS — M5442 Lumbago with sciatica, left side: Secondary | ICD-10-CM | POA: Insufficient documentation

## 2018-03-21 DIAGNOSIS — M5441 Lumbago with sciatica, right side: Secondary | ICD-10-CM | POA: Insufficient documentation

## 2018-03-21 DIAGNOSIS — R293 Abnormal posture: Secondary | ICD-10-CM | POA: Diagnosis not present

## 2018-03-21 NOTE — Therapy (Signed)
[facility] [address], [place], [place], [address] Phone: [phone]   Fax:  [phone]  Physical Therapy Evaluation  Patient Details  Name: Kimberly [name] MRN: [id number] Date of Birth: [birth year] Referring Provider (PT): Lauree Chandler, NP   Encounter Date: 03/21/2018  PT End of Session - 03/21/18 1453    Visit Number  1    Date for PT Re-Evaluation  05/16/18    PT Start Time  1448    PT Stop Time  1525    PT Time Calculation (min)  37 min    Activity Tolerance  Patient tolerated treatment well    Behavior During Therapy  [facility] for tasks assessed/performed       Past Medical History:  Diagnosis Date  . Allergic rhinitis   . Anxiety   . Atrophic vaginitis 2017  . Depression   . GERD (gastroesophageal reflux disease)   . Hematuria 05/09/2015   had two nonobstructing renal calculi  . Hepatitis A 1970s  . Hyperlipidemia   . Osteopenia   . Shingles 06/04/2015  . SUI (stress urinary incontinence, female) 2017  . Thrombocytopenia ([place])     Past Surgical History:  Procedure Laterality Date  . CATARACT EXTRACTION W/ INTRAOCULAR LENS  IMPLANT, BILATERAL Bilateral   . KNEE ARTHROSCOPY  2004, 1990s  . TONSILLECTOMY AND ADENOIDECTOMY  1952    There were no vitals filed for this visit.   Subjective Assessment - 03/21/18 1450    Subjective  Pt states have been having pain for the last 3 months.  She has been helping her husband get up and down that increased the pain.      Patient Stated Goals  feel better    Currently in Pain?  Yes    Pain Score  5     Pain Location  Back    Pain Orientation  Right;Left    Pain Descriptors / Indicators  Sharp;Aching    Pain Type  Chronic pain    Pain Radiating Towards  into butt and down legs    Pain Onset  More than a month ago    Pain Frequency  Constant    Aggravating Factors   getting out of bed     Pain Relieving Factors  Motrin, as the day goes on gets  better    Multiple Pain Sites  No         [facility] PT Assessment - 03/21/18 0001      Assessment   Medical Diagnosis  M54.42 (ICD-10-CM) - Acute back pain with sciatica, left    Referring Provider (PT)  Lauree Chandler, NP    Onset Date/Surgical Date  12/20/17    Prior Therapy  Yes, one visit      Precautions   Precautions  None      Restrictions   Weight Bearing Restrictions  No      Balance Screen   Has the patient fallen in the past 6 months  No      [place] residence    Living Arrangements  Spouse/significant other      Prior Function   Level of [place]  Retired      Associate Professor   Overall Cognitive Status  Within Functional Limits for tasks assessed      Observation/Other Assessments   Focus on Therapeutic Outcomes (FOTO)   58% limited      Posture/Postural  Control   Posture/Postural Control  Postural limitations    Postural Limitations  Rounded Shoulders;Decreased thoracic kyphosis;Anterior pelvic tilt      ROM / Strength   AROM / PROM / Strength  AROM;PROM;Strength      AROM   AROM Assessment Site  Lumbar;Hip    Right/Left Hip  Right;Left    Right Hip External Rotation   30    Left Hip External Rotation   30    Lumbar Flexion  WFL    Lumbar Extension  WFL    Lumbar - Right Side Bend  WFL    Lumbar - Left Side Bend  WFL    Lumbar - Right Rotation  WFL    Lumbar - Left Rotation  WFL      Strength   Overall Strength Comments  abdominal bracing improves SLR    Strength Assessment Site  Hip    Right/Left Hip  Right;Left    Right Hip Flexion  5/5    Right Hip Extension  4-/5    Right Hip External Rotation   4+/5    Right Hip ABduction  4-/5    Right Hip ADduction  4-/5    Left Hip Flexion  5/5    Left Hip Extension  4+/5    Left Hip External Rotation  5/5    Left Hip ABduction  4+/5    Left Hip ADduction  4/5      Flexibility   Soft Tissue Assessment /Muscle Length  yes     Hamstrings  10% limited    Piriformis  25% limited      Palpation   Palpation comment  lumbar and thoracic paraspinals, glutes tight      Special Tests    Special Tests  Lumbar    Lumbar Tests  Straight Leg Raise      Straight Leg Raise   Findings  Positive    Side   Left    Comment  improved with bracing of abdominals      Ambulation/Gait   Gait Pattern  Trunk flexed;Decreased stride length                Objective measurements completed on examination: See above findings.              PT Education - 03/21/18 1604    Education Details   Access Code: [email or web address]     Person(s) Educated  Patient    Methods  Explanation;Demonstration;Handout;Verbal cues    Comprehension  Verbalized understanding;Returned demonstration       PT Short Term Goals - 03/21/18 1554      PT SHORT TERM GOAL #1   Title  ind with initial HEP    Time  4    Period  Weeks    Status  New    Target Date  04/18/18      PT SHORT TERM GOAL #2   Title  Pt will demonstrate Rt LE hip abduction of 4+/5 MMT for improved stability during functional tasks    Baseline  4-/5MMT    Time  4    Period  Weeks    Status  New    Target Date  04/18/18        PT Long Term Goals - 03/21/18 1543      PT LONG TERM GOAL #1   Title  pt will be able to get out of bed with 60% less pain    Baseline  falls to the ground with  pain 10/10    Time  8    Period  Weeks    Status  New    Target Date  05/16/18      PT LONG TERM GOAL #2   Title  Pt will be able to lift 10 lb with good body mechanics for improved function and reduced risk of injury during functional tasks    Time  8    Period  Weeks    Status  New    Target Date  05/16/18      PT LONG TERM GOAL #3   Title  Pt will report < or = to 3/10 pain at most during typical day    Baseline  5/10 pain    Time  8    Period  Weeks    Status  New    Target Date  05/16/18      PT LONG TERM GOAL #4   Title  Pt will report < or = to 46% limited     Time  8    Period  Weeks    Status  New    Target Date  05/16/18             Plan - 03/21/18 1556    Clinical Impression Statement  Pt presents to skilled PT due to pain that has been ongoing for the last 3 months.  She is experiencing pain down BLE especially in the morning.  Pain subsides throughout the day but remains preesnt.  pt demosntrates decreaed hip ROM and h/s flexibility.  Pt demonstrates muscle spasms in lumbar paraspinals and glutes.  She has positive SLR Lt LE.  Pt has muscle weakness in core and Rt LE.  Pt has posture andgait abnormalities as mentioned above.  She lacks coordination of breathing when attempting to stabilize her core during funcitonal movements.  Pt will benefit from skilled PT to address impairments for improved function and to decrease risk of injury during her daily tasks.    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; stress, chronic pain anxiety/depression    Clinical Presentation  Stable    Clinical Presentation due to:  pt is stable    Clinical Decision Making  Low    Rehab Potential  Excellent    PT Frequency  2x / week    PT Duration  8 weeks    PT Treatment/Interventions  ADLs/Self Care Home Management;Cryotherapy;Biofeedback;Electrical Stimulation;Moist Heat;Traction;Therapeutic activities;Therapeutic exercise;Functional mobility training;Neuromuscular re-education;Patient/family education;Manual techniques;Dry needling;Passive range of motion;Taping    PT Next Visit Plan  glute and lumbar paraspinals STM; lumbar traction, moist heat, core and hip strengthening as tolerated    PT Home Exercise Plan   Access Code: [email or web address]     Recommended Other Services  eval 03/21/18    Consulted and Agree with Plan of Care  Patient       Patient will benefit from skilled therapeutic intervention in order to improve the following deficits and impairments:  Abnormal gait, Increased fascial restricitons, Pain,  Improper body mechanics, Increased muscle spasms, Postural dysfunction, Decreased coordination, Decreased range of motion, Decreased strength, Impaired flexibility  Visit Diagnosis: Chronic bilateral low back pain with bilateral sciatica  Muscle weakness (generalized)  Abnormal posture     Problem List Patient Active Problem List   Diagnosis Date Noted  . 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 Uncontrolled hypertension 11/22/2016  . Adjustment disorder with anxious mood 02/04/2016  . Hyperlipidemia associated with type 2 diabetes  mellitus ([place]) 02/04/2016  . Type 2 diabetes mellitus without complication, without long-term current use of insulin ([place]) 02/04/2016  . Basal cell carcinoma of left nasal sidewall 10/22/2011    Zannie Cove, PT 03/21/2018, 4:14 PM  [facility] [address] [place], [place], [address] Phone: [phone]   Fax:  [phone]  Name: Kimberly [name] MRN: [id number] Date of Birth: 04-04-1946

## 2018-03-25 ENCOUNTER — Encounter: Payer: Self-pay | Admitting: Physical Therapy

## 2018-03-25 ENCOUNTER — Ambulatory Visit: Payer: Medicare Other | Admitting: Physical Therapy

## 2018-03-25 ENCOUNTER — Other Ambulatory Visit: Payer: Self-pay

## 2018-03-25 DIAGNOSIS — M5441 Lumbago with sciatica, right side: Principal | ICD-10-CM

## 2018-03-25 DIAGNOSIS — M5442 Lumbago with sciatica, left side: Principal | ICD-10-CM

## 2018-03-25 DIAGNOSIS — G8929 Other chronic pain: Secondary | ICD-10-CM

## 2018-03-25 DIAGNOSIS — R293 Abnormal posture: Secondary | ICD-10-CM

## 2018-03-25 DIAGNOSIS — M6281 Muscle weakness (generalized): Secondary | ICD-10-CM

## 2018-03-25 MED ORDER — ESOMEPRAZOLE MAGNESIUM 20 MG PO CPDR: 20.0000 mg | DELAYED_RELEASE_CAPSULE | Freq: Every day | ORAL | 3 refills | Status: DC

## 2018-03-25 NOTE — Patient Instructions (Addendum)
Scapula Adduction With Pectorals, Mid-Range   Stand in doorframe with palms against frame and arms at 90. Lean forward and squeeze shoulder blades. Hold __15_ seconds. Repeat __2_ times per session. Do _2__ sessions per day.  Therapeutic - Bridging    Lift buttocks, keeping back straight and arms on floor. Hold __2__ seconds. Repeat __10__ times.   Sleeping on Back  Place pillow under knees. A pillow with cervical support and a roll around waist are also helpful. Copyright  VHI. All rights reserved.  Sleeping on Side Place pillow between knees. Use cervical support under neck and a roll around waist as needed. Copyright  VHI. All rights reserved.   Sleeping on Stomach   If this is the only desirable sleeping position, place pillow under lower legs, and under stomach or chest as needed.  Posture - Sitting   Sit upright, head facing forward. Try using a roll to support lower back. Keep shoulders relaxed, and avoid rounded back. Keep hips level with knees. Avoid crossing legs for long periods. Stand to Sit / Sit to Stand   To sit: Bend knees to lower self onto front edge of chair, then scoot back on seat. To stand: Reverse sequence by placing one foot forward, and scoot to front of seat. Use rocking motion to stand up.   Work Height and Reach  Ideal work height is no more than 2 to 4 inches below elbow level when standing, and at elbow level when sitting. Reaching should be limited to arm's length, with elbows slightly bent.  Bending  Bend at hips and knees, not back. Keep feet shoulder-width apart.    Posture - Standing   Good posture is important. Avoid slouching and forward head thrust. Maintain curve in low back and align ears over shoul- ders, hips over ankles.  Alternating Positions   Alternate tasks and change positions frequently to reduce fatigue and muscle tension. Take rest breaks. Computer Work   Position work to Programmer, multimedia. Use proper work and  seat height. Keep shoulders back and down, wrists straight, and elbows at right angles. Use chair that provides full back support. Add footrest and lumbar roll as needed.  Getting Into / Out of Car  Lower self onto seat, scoot back, then bring in one leg at a time. Reverse sequence to get out.  Dressing  Lie on back to pull socks or slacks over feet, or sit and bend leg while keeping back straight.    Housework - Sink  Place one foot on ledge of cabinet under sink when standing at sink for prolonged periods.   Pushing / Pulling  Pushing is preferable to pulling. Keep back in proper alignment, and use leg muscles to do the work.  Deep Squat   Squat and lift with both arms held against upper trunk. Tighten stomach muscles without holding breath. Use smooth movements to avoid jerking.  Avoid Twisting   Avoid twisting or bending back. Pivot around using foot movements, and bend at knees if needed when reaching for articles.  Carrying Luggage   Distribute weight evenly on both sides. Use a cart whenever possible. Do not twist trunk. Move body as a unit.   Lifting Principles .Maintain proper posture and head alignment. .Slide object as close as possible before lifting. .Move obstacles out of the way. .Test before lifting; ask for help if too heavy. .Tighten stomach muscles without holding breath. .Use smooth movements; do not jerk. .Use legs to do the work, and pivot with  feet. .Distribute the work load symmetrically and close to the center of trunk. .Push instead of pull whenever possible.   Ask For Help   Ask for help and delegate to others when possible. Coordinate your movements when lifting together, and maintain the low back curve.  Log Roll   Lying on back, bend left knee and place left arm across chest. Roll all in one movement to the right. Reverse to roll to the left. Always move as one unit. Housework - Sweeping  Use long-handled equipment to avoid  stooping.   Housework - Wiping  Position yourself as close as possible to reach work surface. Avoid straining your back.  Laundry - Unloading Wash   To unload small items at bottom of washer, lift leg opposite to arm being used to reach.  Cisco close to area to be raked. Use arm movements to do the work. Keep back straight and avoid twisting.     Cart  When reaching into cart with one arm, lift opposite leg to keep back straight.   Getting Into / Out of Bed  Lower self to lie down on one side by raising legs and lowering head at the same time. Use arms to assist moving without twisting. Bend both knees to roll onto back if desired. To sit up, start from lying on side, and use same move-ments in reverse. Housework - Vacuuming  Hold the vacuum with arm held at side. Step back and forth to move it, keeping head up. Avoid twisting.   Laundry - IT consultant so that bending and twisting can be avoided.   Laundry - Unloading Dryer  Squat down to reach into clothes dryer or use a reacher.  Gardening - Weeding / Probation officer or Kneel. Knee pads may be helpful.

## 2018-03-25 NOTE — Therapy (Signed)
[facility] [address] [place], [place], [address] Phone: [phone]   Fax:  [phone]  Physical Therapy Treatment  Patient Details  Name: Kimberly [name] MRN: [id number] Date of Birth: March 26, 1946 Referring Provider (PT): Lauree Chandler, NP   Encounter Date: 03/25/2018  PT End of Session - 03/25/18 1108    Visit Number  2    Date for PT Re-Evaluation  05/16/18    PT Start Time  1104    PT Stop Time  3875    PT Time Calculation (min)  33 min    Activity Tolerance  Patient tolerated treatment well;No increased pain    Behavior During Therapy  WFL for tasks assessed/performed       Past Medical History:  Diagnosis Date  . Allergic rhinitis   . Anxiety   . Atrophic vaginitis 2017  . Depression   . GERD (gastroesophageal reflux disease)   . Hematuria 05/09/2015   had two nonobstructing renal calculi  . Hepatitis A 1970s  . Hyperlipidemia   . Osteopenia   . Shingles 06/04/2015  . SUI (stress urinary incontinence, female) 2017  . Thrombocytopenia ([place])     Past Surgical History:  Procedure Laterality Date  . CATARACT EXTRACTION W/ INTRAOCULAR LENS  IMPLANT, BILATERAL Bilateral   . KNEE ARTHROSCOPY  2004, 1990s  . TONSILLECTOMY AND ADENOIDECTOMY  1952    There were no vitals filed for this visit.  Subjective Assessment - 03/25/18 1104    Subjective  Pt states she's been sleeping funny on her shoulders so they are painful today. She was putting luminaries out last night and her back is painful again.  (She was feeling a little bit better after last session).  Pt states she has been doing exercises 2x/day.     Pain Score  2     Pain Location  Back    Pain Orientation  Right;Left    Pain Descriptors / Indicators  Aching    Aggravating Factors   bending over     Pain Relieving Factors  laying down and stretches with strap; motrin     Multiple Pain Sites  Yes    Pain Score  4    Pain Location   Shoulder    Pain Orientation  Right;Left    Pain Descriptors / Indicators  Aching    Aggravating Factors   sleep position     Pain Relieving Factors  Motrin          [facility] PT Assessment - 03/25/18 0001      Assessment   Medical Diagnosis  M54.42 (ICD-10-CM) - Acute back pain with sciatica, left    Referring Provider (PT)  Lauree Chandler, NP    Onset Date/Surgical Date  12/20/17    Prior Therapy  Yes, one visit       [facility] Adult PT Treatment/Exercise - 03/25/18 0001      Exercises   Exercises  Lumbar      Lumbar Exercises: Stretches   Passive Hamstring Stretch  Right;Left;2 reps;30 seconds    Single Knee to Chest Stretch  Right;Left;2 reps    Lower Trunk Rotation  4 reps;10 seconds   each side   Piriformis Stretch  Right;Left;2 reps;30 seconds   knee towards opp shoulder   Figure 4 Stretch  2 reps;20 seconds   supine   Other Lumbar Stretch Exercise  3 position doorway stretch x 15 sec x 2 reps each.  Lumbar Exercises: Aerobic   Nustep  L4: (arms/legs) x 5 min    PTA present to monitor and discuss progress     Lumbar Exercises: Supine   Bent Knee Raise  10 reps;2 seconds   cues to slow down, and contract TA   Dead Bug  10 reps;2 seconds   cues for form.    Bridge  10 reps         PT Short Term Goals - 03/21/18 1554      PT SHORT TERM GOAL #1   Title  ind with initial HEP    Time  4    Period  Weeks    Status  New    Target Date  04/18/18      PT SHORT TERM GOAL #2   Title  Pt will demonstrate Rt LE hip abduction of 4+/5 MMT for improved stability during functional tasks    Baseline  4-/5MMT    Time  4    Period  Weeks    Status  New    Target Date  04/18/18        PT Long Term Goals - 03/21/18 1543      PT LONG TERM GOAL #1   Title  pt will be able to get out of bed with 60% less pain    Baseline  falls to the ground with pain 10/10    Time  8    Period  Weeks    Status  New    Target Date  05/16/18      PT LONG TERM GOAL #2    Title  Pt will be able to lift 10 lb with good body mechanics for improved function and reduced risk of injury during functional tasks    Time  8    Period  Weeks    Status  New    Target Date  05/16/18      PT LONG TERM GOAL #3   Title  Pt will report < or = to 3/10 pain at most during typical day    Baseline  5/10 pain    Time  8    Period  Weeks    Status  New    Target Date  05/16/18      PT LONG TERM GOAL #4   Title  Pt will report < or = to 46% limited    Time  8    Period  Weeks    Status  New    Target Date  05/16/18            Plan - 03/25/18 1154    Clinical Impression Statement  Pt tolerated all exercises well, reporting elimination of pain at completion of session.  Pt issued information of body mechanics and posture; will benefit from continued education regarding this to decrease LBP with functional activities including assisting husband.  Progressing towards goals.     Rehab Potential  Excellent    PT Frequency  2x / week    PT Duration  8 weeks    PT Treatment/Interventions  ADLs/Self Care Home Management;Cryotherapy;Biofeedback;Electrical Stimulation;Moist Heat;Traction;Therapeutic activities;Therapeutic exercise;Functional mobility training;Neuromuscular re-education;Patient/family education;Manual techniques;Dry needling;Passive range of motion;Taping    PT Next Visit Plan  continue spinal stabilization exercises; education on body mechanics with lifting/ assisting husband.     PT Home Exercise Plan   Access Code: [email or web address] - added bridges and doorway stretch    Consulted and Agree with Plan of Care  Patient  Patient will benefit from skilled therapeutic intervention in order to improve the following deficits and impairments:  Abnormal gait, Increased fascial restricitons, Pain, Improper body mechanics, Increased muscle spasms, Postural dysfunction, Decreased coordination, Decreased range of motion, Decreased strength, Impaired flexibility  Visit  Diagnosis: Chronic bilateral low back pain with bilateral sciatica  Muscle weakness (generalized)  Abnormal posture     Problem List Patient Active Problem List   Diagnosis Date Noted  . 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 Uncontrolled hypertension 11/22/2016  . Adjustment disorder with anxious mood 02/04/2016  . Hyperlipidemia associated with type 2 diabetes mellitus ([place]) 02/04/2016  . Type 2 diabetes mellitus without complication, without long-term current use of insulin ([place]) 02/04/2016  . Basal cell carcinoma of left nasal sidewall 10/22/2011   Kerin Perna, PTA 03/25/18 11:58 AM  [facility] [address], [place], [place], [address] Phone: [phone]   Fax:  [phone]  Name: Kimberly [name] MRN: [id number] Date of Birth: 29-Sep-1945

## 2018-03-29 ENCOUNTER — Encounter: Payer: Self-pay | Admitting: Physical Therapy

## 2018-03-29 ENCOUNTER — Ambulatory Visit: Payer: Medicare Other | Admitting: Physical Therapy

## 2018-03-29 DIAGNOSIS — M6281 Muscle weakness (generalized): Secondary | ICD-10-CM

## 2018-03-29 DIAGNOSIS — M5442 Lumbago with sciatica, left side: Principal | ICD-10-CM

## 2018-03-29 DIAGNOSIS — R293 Abnormal posture: Secondary | ICD-10-CM | POA: Diagnosis not present

## 2018-03-29 DIAGNOSIS — G8929 Other chronic pain: Secondary | ICD-10-CM

## 2018-03-29 DIAGNOSIS — M5441 Lumbago with sciatica, right side: Secondary | ICD-10-CM | POA: Diagnosis not present

## 2018-03-29 NOTE — Therapy (Signed)
[facility] [address], [place], [place], [address] Phone: [phone]   Fax:  [phone]  Physical Therapy Treatment  Patient Details  Name: Kimberly [name] MRN: [id number] Date of Birth: [birth year] Referring Provider (PT): Lauree Chandler, NP   Encounter Date: 03/29/2018  PT End of Session - 03/29/18 1103    Visit Number  3    Date for PT Re-Evaluation  05/16/18    PT Start Time  8889    PT Stop Time  1140    PT Time Calculation (min)  38 min    Activity Tolerance  Patient tolerated treatment well;No increased pain    Behavior During Therapy  WFL for tasks assessed/performed       Past Medical History:  Diagnosis Date  . Allergic rhinitis   . Anxiety   . Atrophic vaginitis 2017  . Depression   . GERD (gastroesophageal reflux disease)   . Hematuria 05/09/2015   had two nonobstructing renal calculi  . Hepatitis A 1970s  . Hyperlipidemia   . Osteopenia   . Shingles 06/04/2015  . SUI (stress urinary incontinence, female) 2017  . Thrombocytopenia ([place])     Past Surgical History:  Procedure Laterality Date  . CATARACT EXTRACTION W/ INTRAOCULAR LENS  IMPLANT, BILATERAL Bilateral   . KNEE ARTHROSCOPY  2004, 1990s  . TONSILLECTOMY AND ADENOIDECTOMY  1952    There were no vitals filed for this visit.  Subjective Assessment - 03/29/18 1106    Subjective  Pt states she is feeling better today in the back. If I have been sitting and I get up I have pain that shoot down.  Today I feel sore.    Patient Stated Goals  feel better    Currently in Pain?  No/denies                       [facility] Adult PT Treatment/Exercise - 03/29/18 0001      Neuro Re-ed    Neuro Re-ed Details   TrA       Lumbar Exercises: Stretches   Active Hamstring Stretch  Right;Left;3 reps;20 seconds    Lower Trunk Rotation  4 reps;10 seconds   each side   Figure 4 Stretch  2 reps;20 seconds   supine   Other Lumbar  Stretch Exercise  seated lumbar/thoracic ext with ball behind - 6x10 sec hold      Lumbar Exercises: Aerobic   Nustep  L4: (arms/legs) x 5 min    PT present to monitor and discuss progress     Lumbar Exercises: Standing   Row  Strengthening;Both;10 reps;Theraband    Theraband Level (Row)  Level 2 (Red)    Shoulder Extension  Strengthening;Both;10 reps;Theraband    Theraband Level (Shoulder Extension)  Level 1 (Yellow)      Lumbar Exercises: Seated   Long Arc Quad on Chair  Strengthening;Right;Left;10 reps   TrA engaged   Sit to Stand  10 reps   TrA bracing   Other Seated Lumbar Exercises  press into foam roll       Lumbar Exercises: Supine   Pelvic Tilt  15 reps    Pelvic Tilt Limitations  with ball squeeze    Bridge  10 reps   mini bridge with ball squeeze   Large Ball Abdominal Isometric  10 reps;5 seconds   ball roll out LE; flexion holding ball UE     Lumbar Exercises: Sidelying   Clam  Right;Left;10 reps;5 seconds             PT Education - 03/29/18 1144    Education Details   Access Code: [email or web address]     Person(s) Educated  Patient    Methods  Explanation;Demonstration;Handout;Verbal cues    Comprehension  Verbalized understanding;Returned demonstration       PT Short Term Goals - 03/21/18 1554      PT SHORT TERM GOAL #1   Title  ind with initial HEP    Time  4    Period  Weeks    Status  New    Target Date  04/18/18      PT SHORT TERM GOAL #2   Title  Pt will demonstrate Rt LE hip abduction of 4+/5 MMT for improved stability during functional tasks    Baseline  4-/5MMT    Time  4    Period  Weeks    Status  New    Target Date  04/18/18        PT Long Term Goals - 03/21/18 1543      PT LONG TERM GOAL #1   Title  pt will be able to get out of bed with 60% less pain    Baseline  falls to the ground with pain 10/10    Time  8    Period  Weeks    Status  New    Target Date  05/16/18      PT LONG TERM GOAL #2   Title  Pt will be able to lift  10 lb with good body mechanics for improved function and reduced risk of injury during functional tasks    Time  8    Period  Weeks    Status  New    Target Date  05/16/18      PT LONG TERM GOAL #3   Title  Pt will report < or = to 3/10 pain at most during typical day    Baseline  5/10 pain    Time  8    Period  Weeks    Status  New    Target Date  05/16/18      PT LONG TERM GOAL #4   Title  Pt will report < or = to 46% limited    Time  8    Period  Weeks    Status  New    Target Date  05/16/18            Plan - 03/29/18 1145    Clinical Impression Statement  Pt continues to report improvements.  She did well with progression of core strengthening exercises needing cues to bring shoulders back and maintain pelvic tilt.  Pt will benefit from skilled PT for improved core strength for greater lumbar stability during functional activities.    PT Treatment/Interventions  ADLs/Self Care Home Management;Cryotherapy;Biofeedback;Electrical Stimulation;Moist Heat;Traction;Therapeutic activities;Therapeutic exercise;Functional mobility training;Neuromuscular re-education;Patient/family education;Manual techniques;Dry needling;Passive range of motion;Taping    PT Next Visit Plan  continue spinal stabilization exercises; education on body mechanics with lifting/ assisting husband.     PT Home Exercise Plan   Access Code: [email or web address]     Consulted and Agree with Plan of Care  Patient       Patient will benefit from skilled therapeutic intervention in order to improve the following deficits and impairments:  Abnormal gait, Increased fascial restricitons, Pain, Improper body mechanics, Increased muscle spasms, Postural dysfunction, Decreased coordination, Decreased range of motion, Decreased strength, Impaired flexibility  Visit Diagnosis: Chronic bilateral low back pain with bilateral sciatica  Muscle weakness (generalized)  Abnormal posture     Problem List Patient Active Problem  List   Diagnosis Date Noted  . 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 Uncontrolled hypertension 11/22/2016  . Adjustment disorder with anxious mood 02/04/2016  . Hyperlipidemia associated with type 2 diabetes mellitus ([place]) 02/04/2016  . Type 2 diabetes mellitus without complication, without long-term current use of insulin ([place]) 02/04/2016  . Basal cell carcinoma of left nasal sidewall 10/22/2011    Zannie Cove, PT 03/29/2018, 11:48 AM  [facility] [address] [place], [place], [address] Phone: [phone]   Fax:  [phone]  Name: Kimberly [name] MRN: [id number] Date of Birth: 06/13/1945

## 2018-03-29 NOTE — Patient Instructions (Signed)
Access Code: [email or web address]  URL: [email or web address]  Date: 03/29/2018  Prepared by: Lovett Calender   Exercises  Supine Hamstring Stretch with Strap - 3 reps - 1 sets - 30 sec hold - 1x daily - 7x weekly  Supine Figure 4 Piriformis Stretch - 3 reps - 1 sets - 30 sec hold - 1x daily - 7x weekly  Supine Single Knee to Chest Stretch - 5 reps - 1 sets - 10 sec hold - 2x daily - 7x weekly  Hooklying Small March - 10 reps - 2 sets - 1x daily - 7x weekly  Standing Row with Anchored Resistance - 10 reps - 3 sets - 1x daily - 7x weekly  Standing Shoulder Extension with Resistance - 10 reps - 3 sets - 1x daily - 7x weekly

## 2018-03-31 ENCOUNTER — Encounter: Payer: Self-pay | Admitting: Physical Therapy

## 2018-03-31 ENCOUNTER — Ambulatory Visit: Payer: Medicare Other | Admitting: Physical Therapy

## 2018-03-31 DIAGNOSIS — M5442 Lumbago with sciatica, left side: Secondary | ICD-10-CM | POA: Diagnosis not present

## 2018-03-31 DIAGNOSIS — M5441 Lumbago with sciatica, right side: Principal | ICD-10-CM

## 2018-03-31 DIAGNOSIS — G8929 Other chronic pain: Secondary | ICD-10-CM

## 2018-03-31 DIAGNOSIS — M6281 Muscle weakness (generalized): Secondary | ICD-10-CM | POA: Diagnosis not present

## 2018-03-31 DIAGNOSIS — R293 Abnormal posture: Secondary | ICD-10-CM

## 2018-03-31 NOTE — Patient Instructions (Signed)
Access Code: [email or web address]  URL: [email or web address]  Date: 03/31/2018  Prepared by: Lovett Calender   Exercises  Supine Hamstring Stretch with Strap - 3 reps - 1 sets - 30 sec hold - 1x daily - 7x weekly  Supine Figure 4 Piriformis Stretch - 3 reps - 1 sets - 30 sec hold - 1x daily - 7x weekly  Supine Single Knee to Chest Stretch - 5 reps - 1 sets - 10 sec hold - 2x daily - 7x weekly  Hooklying Small March - 10 reps - 2 sets - 1x daily - 7x weekly  Standing Row with Anchored Resistance - 10 reps - 3 sets - 1x daily - 7x weekly  Standing Shoulder Extension with Resistance - 10 reps - 3 sets - 1x daily - 7x weekly  Supine Lower Trunk Rotation - 10 reps - 3 sets - 1x daily - 7x weekly  Sidelying Thoracic Rotation with Open Book - 5 reps - 1 sets - 10 sec hold - 1x daily - 7x weekly  Child's Pose Stretch - 10 reps - 3 sets - 1x daily - 7x weekly  Cat-Camel - 10 reps - 3 sets - 1x daily - 7x weekly

## 2018-03-31 NOTE — Therapy (Signed)
[facility] [address], [place], [place], [address] Phone: [phone]   Fax:  [phone]  Physical Therapy Treatment  Patient Details  Name: Kimberly [name] MRN: [id number] Date of Birth: 07-Nov-1945 Referring Provider (PT): Lauree Chandler, NP   Encounter Date: 03/31/2018  PT End of Session - 03/31/18 1233    Visit Number  4    Date for PT Re-Evaluation  05/16/18    PT Start Time  1227    PT Stop Time  8338    PT Time Calculation (min)  46 min    Activity Tolerance  Patient tolerated treatment well;No increased pain    Behavior During Therapy  WFL for tasks assessed/performed       Past Medical History:  Diagnosis Date  . Allergic rhinitis   . Anxiety   . Atrophic vaginitis 2017  . Depression   . GERD (gastroesophageal reflux disease)   . Hematuria 05/09/2015   had two nonobstructing renal calculi  . Hepatitis A 1970s  . Hyperlipidemia   . Osteopenia   . Shingles 06/04/2015  . SUI (stress urinary incontinence, female) 2017  . Thrombocytopenia ([place])     Past Surgical History:  Procedure Laterality Date  . CATARACT EXTRACTION W/ INTRAOCULAR LENS  IMPLANT, BILATERAL Bilateral   . KNEE ARTHROSCOPY  2004, 1990s  . TONSILLECTOMY AND ADENOIDECTOMY  1952    There were no vitals filed for this visit.  Subjective Assessment - 03/31/18 1229    Subjective  My back is not too bad but my arms are sore from lifting.      Patient Stated Goals  feel better    Currently in Pain?  No/denies                       [facility] Adult PT Treatment/Exercise - 03/31/18 0001      Exercises   Exercises  Knee/Hip      Lumbar Exercises: Stretches   Double Knee to Chest Stretch  1 rep;60 seconds    Lower Trunk Rotation  4 reps;10 seconds   each side   Figure 4 Stretch  2 reps;20 seconds   supine   Other Lumbar Stretch Exercise  sidelying thoracic rotation stretch - 10x each side      Lumbar Exercises:  Aerobic   Nustep  L4: (arms/legs) x 5 min    PT present to monitor and discuss progress     Lumbar Exercises: Standing   Row  Strengthening;Both;10 reps;Theraband    Theraband Level (Row)  Level 3 (Green)    Shoulder Extension  Strengthening;Both;10 reps;Theraband    Theraband Level (Shoulder Extension)  Level 1 (Yellow)   with LE marching      Lumbar Exercises: Supine   Clam  20 reps   with pelvic tilt     Knee/Hip Exercises: Standing   Lateral Step Up  Right;Left;10 reps;Hand Hold: 2;2 sets;Step Height: 6"    Forward Step Up  Right;Left;2 sets;10 reps;Step Height: 6";Hand Hold: 1    Functional Squat  20 reps    Other Standing Knee Exercises  side step 20x yellow band around feet             PT Education - 03/31/18 1312    Education Details   Access Code: [email or web address]     Person(s) Educated  Patient    Methods  Explanation;Demonstration;Handout;Verbal cues;Tactile cues    Comprehension  Verbalized understanding;Returned demonstration  PT Short Term Goals - 03/31/18 1314      PT SHORT TERM GOAL #1   Title  ind with initial HEP    Status  Achieved      PT SHORT TERM GOAL #2   Title  Pt will demonstrate Rt LE hip abduction of 4+/5 MMT for improved stability during functional tasks    Status  On-going        PT Long Term Goals - 03/21/18 1543      PT LONG TERM GOAL #1   Title  pt will be able to get out of bed with 60% less pain    Baseline  falls to the ground with pain 10/10    Time  8    Period  Weeks    Status  New    Target Date  05/16/18      PT LONG TERM GOAL #2   Title  Pt will be able to lift 10 lb with good body mechanics for improved function and reduced risk of injury during functional tasks    Time  8    Period  Weeks    Status  New    Target Date  05/16/18      PT LONG TERM GOAL #3   Title  Pt will report < or = to 3/10 pain at most during typical day    Baseline  5/10 pain    Time  8    Period  Weeks    Status  New    Target Date   05/16/18      PT LONG TERM GOAL #4   Title  Pt will report < or = to 46% limited    Time  8    Period  Weeks    Status  New    Target Date  05/16/18            Plan - 03/31/18 1313    Clinical Impression Statement  Pt continues to tolerate increased resistance and difficulty.  She needs cues to maintain neutral pelvis during squats and standing exercises.  Pt did well with stretches and reports feeling better after treatment.  She will benefit from skilled PT to continue core strengthening.    PT Treatment/Interventions  ADLs/Self Care Home Management;Cryotherapy;Biofeedback;Electrical Stimulation;Moist Heat;Traction;Therapeutic activities;Therapeutic exercise;Functional mobility training;Neuromuscular re-education;Patient/family education;Manual techniques;Dry needling;Passive range of motion;Taping    PT Next Visit Plan  continue spinal stabilization exercises; education on body mechanics with lifting/ assisting husband.     PT Home Exercise Plan   Access Code: [email or web address]     Consulted and Agree with Plan of Care  Patient       Patient will benefit from skilled therapeutic intervention in order to improve the following deficits and impairments:  Abnormal gait, Increased fascial restricitons, Pain, Improper body mechanics, Increased muscle spasms, Postural dysfunction, Decreased coordination, Decreased range of motion, Decreased strength, Impaired flexibility  Visit Diagnosis: Chronic bilateral low back pain with bilateral sciatica  Muscle weakness (generalized)  Abnormal posture     Problem List Patient Active Problem List   Diagnosis Date Noted  . 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 Uncontrolled hypertension 11/22/2016  . Adjustment disorder with anxious mood 02/04/2016  . Hyperlipidemia associated with type 2 diabetes mellitus ([place]) 02/04/2016  . Type 2 diabetes mellitus without complication, without long-term current use of insulin ([place]) 02/04/2016  . Basal cell  carcinoma of left nasal sidewall 10/22/2011    Zannie Cove, PT 03/31/2018, 1:17 PM  [facility] [address] [place], [place], [address] Phone: [phone]   Fax:  [phone]  Name: Kimberly [name] MRN: [id number] Date of Birth: 1946-03-13

## 2018-04-04 ENCOUNTER — Telehealth: Payer: Self-pay

## 2018-04-04 NOTE — Telephone Encounter (Signed)
Called with no answer. Left message for patient to give [facility] a call regarding her referral.

## 2018-04-04 NOTE — Telephone Encounter (Signed)
Patient called complaining of lower back pain with pain shooting down her legs. States that pain has been going on for 4 - 5 months and nothing is helping. Patient stated that is becoming hard to walk and current physical  therapy is not working.  Stated she was prescribed prednisone, but could only take for 5 days due to increase in blood pressure and increase in blood sugar. She requested to have a referral to a spinal specialist. Please advise.

## 2018-04-04 NOTE — Telephone Encounter (Signed)
Would she like to see Dr. Sherlyn Lick who her husband saw or someone specific?

## 2018-04-08 ENCOUNTER — Encounter

## 2018-04-11 ENCOUNTER — Encounter: Payer: Self-pay | Admitting: Physical Therapy

## 2018-04-11 ENCOUNTER — Ambulatory Visit: Payer: Medicare Other | Attending: Nurse Practitioner | Admitting: Physical Therapy

## 2018-04-11 DIAGNOSIS — M6281 Muscle weakness (generalized): Secondary | ICD-10-CM | POA: Insufficient documentation

## 2018-04-11 DIAGNOSIS — M5441 Lumbago with sciatica, right side: Secondary | ICD-10-CM | POA: Diagnosis not present

## 2018-04-11 DIAGNOSIS — G8929 Other chronic pain: Secondary | ICD-10-CM | POA: Diagnosis not present

## 2018-04-11 DIAGNOSIS — R293 Abnormal posture: Secondary | ICD-10-CM | POA: Insufficient documentation

## 2018-04-11 DIAGNOSIS — M5442 Lumbago with sciatica, left side: Secondary | ICD-10-CM | POA: Diagnosis not present

## 2018-04-11 NOTE — Therapy (Signed)
[facility] [address], [place], [place], [address] Phone: [phone]   Fax:  [phone]  Physical Therapy Treatment  Patient Details  Name: Kimberly [name] MRN: [id number] Date of Birth: 11/09/1945 Referring Provider (PT): Lauree Chandler, NP   Encounter Date: 04/11/2018  PT End of Session - 04/11/18 0935    Visit Number  5    Date for PT Re-Evaluation  05/16/18    PT Start Time  0930    PT Stop Time  1010    PT Time Calculation (min)  40 min    Activity Tolerance  Patient tolerated treatment well;No increased pain    Behavior During Therapy  WFL for tasks assessed/performed       Past Medical History:  Diagnosis Date  . Allergic rhinitis   . Anxiety   . Atrophic vaginitis 2017  . Depression   . GERD (gastroesophageal reflux disease)   . Hematuria 05/09/2015   had two nonobstructing renal calculi  . Hepatitis A 1970s  . Hyperlipidemia   . Osteopenia   . Shingles 06/04/2015  . SUI (stress urinary incontinence, female) 2017  . Thrombocytopenia ([place])     Past Surgical History:  Procedure Laterality Date  . CATARACT EXTRACTION W/ INTRAOCULAR LENS  IMPLANT, BILATERAL Bilateral   . KNEE ARTHROSCOPY  2004, 1990s  . TONSILLECTOMY AND ADENOIDECTOMY  1952    There were no vitals filed for this visit.  Subjective Assessment - 04/11/18 0935    Subjective  My back seems to be doing better.  My shoulders are a little sore but the asper cream is helping.    Currently in Pain?  No/denies                       [facility] Adult PT Treatment/Exercise - 04/11/18 0001      Lumbar Exercises: Aerobic   Nustep  L4: (arms/legs) x 5 min    PT present to monitor and discuss progress     Lumbar Exercises: Standing   Row  Strengthening;Both;20 reps    Row Limitations  15lb    Shoulder Extension  Strengthening;Both;20 reps    Shoulder Extension Limitations  15lb      Lumbar Exercises: Supine   Bent  Knee Raise  20 reps    Bridge  20 reps;Limitations    Bridge Limitations  with pelvic tilt    Large Ball Abdominal Isometric  10 reps;5 seconds   ball roll out LE; flexion holding ball UE   Other Supine Lumbar Exercises  thoracic ext with towel roll- UE flex and open book pec stretch      Lumbar Exercises: Prone   Straight Leg Raise  20 reps      Knee/Hip Exercises: Supine   Straight Leg Raises  Strengthening;Right;Left;2 sets;10 reps      Knee/Hip Exercises: Sidelying   Hip ABduction  Strengthening;Right;Left;10 reps    Hip ADduction  Strengthening;Right;Left;10 reps      Manual Therapy   Manual Therapy  Joint mobilization;Soft tissue mobilization    Joint Mobilization  T8-10 A/P grade II     Soft tissue mobilization  thoracic andlumbar paraspinals Lt>Rt               PT Short Term Goals - 03/31/18 1314      PT SHORT TERM GOAL #1   Title  ind with initial HEP    Status  Achieved      PT SHORT  TERM GOAL #2   Title  Pt will demonstrate Rt LE hip abduction of 4+/5 MMT for improved stability during functional tasks    Status  On-going        PT Long Term Goals - 03/21/18 1543      PT LONG TERM GOAL #1   Title  pt will be able to get out of bed with 60% less pain    Baseline  falls to the ground with pain 10/10    Time  8    Period  Weeks    Status  New    Target Date  05/16/18      PT LONG TERM GOAL #2   Title  Pt will be able to lift 10 lb with good body mechanics for improved function and reduced risk of injury during functional tasks    Time  8    Period  Weeks    Status  New    Target Date  05/16/18      PT LONG TERM GOAL #3   Title  Pt will report < or = to 3/10 pain at most during typical day    Baseline  5/10 pain    Time  8    Period  Weeks    Status  New    Target Date  05/16/18      PT LONG TERM GOAL #4   Title  Pt will report < or = to 46% limited    Time  8    Period  Weeks    Status  New    Target Date  05/16/18             Plan - 04/11/18 1019    Clinical Impression Statement  Pt did well with exercises needed minimal cues to engage TrA due to pelvic instability especially in sidelying. Pt demonstrates some core weakness.  She has decreased mobility of thoracic spine T8-10.  She responded well to joint mobs and STM.  Pt did well with towel roll behind back in supine and pec stretch.  No complaints of increased pain and felt the intended stretch.  Pt will benefit from skilled PT to continue to prgress core and postural strength and mobility in order to return to all functional acitvities.    PT Treatment/Interventions  ADLs/Self Care Home Management;Cryotherapy;Biofeedback;Electrical Stimulation;Moist Heat;Traction;Therapeutic activities;Therapeutic exercise;Functional mobility training;Neuromuscular re-education;Patient/family education;Manual techniques;Dry needling;Passive range of motion;Taping    PT Next Visit Plan  thoracic extension, pec stretch, pelvic tilt, posture, body mechanics with lifting    PT Home Exercise Plan   Access Code: [email or web address]     Recommended Other Services  eval 44/96; cert signed    Consulted and Agree with Plan of Care  Patient       Patient will benefit from skilled therapeutic intervention in order to improve the following deficits and impairments:  Abnormal gait, Increased fascial restricitons, Pain, Improper body mechanics, Increased muscle spasms, Postural dysfunction, Decreased coordination, Decreased range of motion, Decreased strength, Impaired flexibility  Visit Diagnosis: Chronic bilateral low back pain with bilateral sciatica  Muscle weakness (generalized)  Abnormal posture     Problem List Patient Active Problem List   Diagnosis Date Noted  . 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 Uncontrolled hypertension 11/22/2016  . Adjustment disorder with anxious mood 02/04/2016  . Hyperlipidemia associated with type 2 diabetes mellitus ([place]) 02/04/2016  . Type 2  diabetes mellitus without complication, without long-term current use of insulin ([place]) 02/04/2016  . Basal cell  carcinoma of left nasal sidewall 10/22/2011    Zannie Cove, PT 04/11/2018, 10:31 AM  [facility] [address], [place], [place], [address] Phone: [phone]   Fax:  [phone]  Name: CLEON THOMA MRN: [id number] Date of Birth: [birth year]

## 2018-04-12 DIAGNOSIS — I10 Essential (primary) hypertension: Secondary | ICD-10-CM | POA: Diagnosis not present

## 2018-04-12 DIAGNOSIS — E119 Type 2 diabetes mellitus without complications: Secondary | ICD-10-CM | POA: Diagnosis not present

## 2018-04-12 LAB — HEMOGLOBIN A1C: Hemoglobin A1C: 7.1

## 2018-04-12 NOTE — Telephone Encounter (Signed)
Have tried several attempts at calling patient and left messages for her to return call to office regarding a referral request. Patient has not responded. Patient has appointment with Dr. Mariea Clonts 04/20/2018.

## 2018-04-13 ENCOUNTER — Ambulatory Visit: Payer: Medicare Other | Admitting: Physical Therapy

## 2018-04-13 ENCOUNTER — Telehealth: Payer: Self-pay | Admitting: *Deleted

## 2018-04-13 ENCOUNTER — Encounter: Payer: Self-pay | Admitting: Physical Therapy

## 2018-04-13 ENCOUNTER — Encounter: Payer: Self-pay | Admitting: Internal Medicine

## 2018-04-13 DIAGNOSIS — M6281 Muscle weakness (generalized): Secondary | ICD-10-CM | POA: Diagnosis not present

## 2018-04-13 DIAGNOSIS — M5442 Lumbago with sciatica, left side: Secondary | ICD-10-CM | POA: Diagnosis not present

## 2018-04-13 DIAGNOSIS — M5441 Lumbago with sciatica, right side: Principal | ICD-10-CM

## 2018-04-13 DIAGNOSIS — G8929 Other chronic pain: Secondary | ICD-10-CM

## 2018-04-13 DIAGNOSIS — R293 Abnormal posture: Secondary | ICD-10-CM | POA: Diagnosis not present

## 2018-04-13 NOTE — Therapy (Signed)
[facility] [address] [place], [place], [address] Phone: [phone]   Fax:  [phone]  Physical Therapy Treatment  Patient Details  Name: Kimberly [name] MRN: [id number] Date of Birth: 1945-04-26 Referring Provider (PT): Lauree Chandler, NP   Encounter Date: 04/13/2018  PT End of Session - 04/13/18 1046    Visit Number  6    Date for PT Re-Evaluation  05/16/18    PT Start Time  5093    PT Stop Time  1105    PT Time Calculation (min)  50 min    Activity Tolerance  Patient tolerated treatment well;No increased pain    Behavior During Therapy  WFL for tasks assessed/performed       Past Medical History:  Diagnosis Date  . Allergic rhinitis   . Anxiety   . Atrophic vaginitis 2017  . Depression   . GERD (gastroesophageal reflux disease)   . Hematuria 05/09/2015   had two nonobstructing renal calculi  . Hepatitis A 1970s  . Hyperlipidemia   . Osteopenia   . Shingles 06/04/2015  . SUI (stress urinary incontinence, female) 2017  . Thrombocytopenia ([place])     Past Surgical History:  Procedure Laterality Date  . CATARACT EXTRACTION W/ INTRAOCULAR LENS  IMPLANT, BILATERAL Bilateral   . KNEE ARTHROSCOPY  2004, 1990s  . TONSILLECTOMY AND ADENOIDECTOMY  1952    There were no vitals filed for this visit.  Subjective Assessment - 04/13/18 1044    Subjective  I feel really bad today, kind of all over. I really did not want to come today but I am here....    Currently in Pain?  Yes    Pain Score  7     Pain Location  --   Back, shoulders, kind of all over it seems.   Pain Descriptors / Indicators  Tender;Radiating;Discomfort    Aggravating Factors   Not sure this AM.    Pain Relieving Factors  nothing today    Multiple Pain Sites  No                       [facility] Adult PT Treatment/Exercise - 04/13/18 0001      Moist Heat Therapy   Number Minutes Moist Heat  15 Minutes    Moist Heat  Location  Lumbar Spine      Electrical Stimulation   Electrical Stimulation Location  Mid thoracic to mid gluteal    Electrical Stimulation Action  IFC    Electrical Stimulation Parameters  80-150 Hz in hooklyig    Electrical Stimulation Goals  Pain      Manual Therapy   Soft tissue mobilization  Lumbar paraspinals. Lt gluteals and hip.               PT Short Term Goals - 03/31/18 1314      PT SHORT TERM GOAL #1   Title  ind with initial HEP    Status  Achieved      PT SHORT TERM GOAL #2   Title  Pt will demonstrate Rt LE hip abduction of 4+/5 MMT for improved stability during functional tasks    Status  On-going        PT Long Term Goals - 03/21/18 1543      PT LONG TERM GOAL #1   Title  pt will be able to get out of bed with 60% less pain    Baseline  falls to  the ground with pain 10/10    Time  8    Period  Weeks    Status  New    Target Date  05/16/18      PT LONG TERM GOAL #2   Title  Pt will be able to lift 10 lb with good body mechanics for improved function and reduced risk of injury during functional tasks    Time  8    Period  Weeks    Status  New    Target Date  05/16/18      PT LONG TERM GOAL #3   Title  Pt will report < or = to 3/10 pain at most during typical day    Baseline  5/10 pain    Time  8    Period  Weeks    Status  New    Target Date  05/16/18      PT LONG TERM GOAL #4   Title  Pt will report < or = to 46% limited    Time  8    Period  Weeks    Status  New    Target Date  05/16/18            Plan - 04/13/18 1046    Clinical Impression Statement  Pt really not feeling well today, has wide spread pain but mainly low back and shoulders. She would not benefit from exercise during the session today as her lumbar paraspinals were pretty tight and tender. Soft tissue work helped reduce her pain especially when she moved. Added Estim and heat at the end of session for further pain relief. Anticipate she will be ready for more  exercise at her next session.     Rehab Potential  Excellent    PT Frequency  2x / week    PT Duration  8 weeks    PT Treatment/Interventions  ADLs/Self Care Home Management;Cryotherapy;Biofeedback;Electrical Stimulation;Moist Heat;Traction;Therapeutic activities;Therapeutic exercise;Functional mobility training;Neuromuscular re-education;Patient/family education;Manual techniques;Dry needling;Passive range of motion;Taping    PT Next Visit Plan  thoracic extension, pec stretch, pelvic tilt, posture, body mechanics with lifting    PT Home Exercise Plan   Access Code: [email or web address]     Consulted and Agree with Plan of Care  Patient       Patient will benefit from skilled therapeutic intervention in order to improve the following deficits and impairments:  Abnormal gait, Increased fascial restricitons, Pain, Improper body mechanics, Increased muscle spasms, Postural dysfunction, Decreased coordination, Decreased range of motion, Decreased strength, Impaired flexibility  Visit Diagnosis: Chronic bilateral low back pain with bilateral sciatica  Muscle weakness (generalized)  Abnormal posture     Problem List Patient Active Problem List   Diagnosis Date Noted  . 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 Uncontrolled hypertension 11/22/2016  . Adjustment disorder with anxious mood 02/04/2016  . Hyperlipidemia associated with type 2 diabetes mellitus ([place]) 02/04/2016  . Type 2 diabetes mellitus without complication, without long-term current use of insulin ([place]) 02/04/2016  . Basal cell carcinoma of left nasal sidewall 10/22/2011    Lavinia Mcneely, PTA 04/13/2018, 10:55 AM  [facility] [address], [place], [place], [address] Phone: [phone]   Fax:  [phone]  Name: Kimberly [name] MRN: [id number] Date of Birth: 1945-11-16

## 2018-04-13 NOTE — Telephone Encounter (Signed)
Patient called and stated that she had blood work done at [facility] and she is anxious to know what her Hemoglobin A1C was. Please Advise.

## 2018-04-13 NOTE — Telephone Encounter (Signed)
Her tests were still pending.  I recommend she call the clinic nurse tomorrow at [facility]--they should all be back by then.

## 2018-04-14 ENCOUNTER — Encounter: Payer: Self-pay | Admitting: Adult Health

## 2018-04-14 ENCOUNTER — Ambulatory Visit (INDEPENDENT_AMBULATORY_CARE_PROVIDER_SITE_OTHER): Payer: Medicare Other | Admitting: Adult Health

## 2018-04-14 VITALS — BP 140/88 | HR 109 | Ht 65.0 in | Wt 133.8 lb

## 2018-04-14 DIAGNOSIS — M159 Polyosteoarthritis, unspecified: Secondary | ICD-10-CM

## 2018-04-14 DIAGNOSIS — M150 Primary generalized (osteo)arthritis: Secondary | ICD-10-CM | POA: Diagnosis not present

## 2018-04-14 MED ORDER — IBUPROFEN 200 MG PO TABS: 400.0000 mg | ORAL_TABLET | Freq: Four times a day (QID) | ORAL | 0 refills | Status: AC | PRN

## 2018-04-14 MED ORDER — DICLOFENAC SODIUM 1 % TD GEL: 2.0000 g | Freq: Three times a day (TID) | TRANSDERMAL | 2 refills | Status: AC | PRN

## 2018-04-14 NOTE — Patient Instructions (Addendum)
Apply Voltaren gel 1% 2gm to bilateral shoulders 3X/day as needed for osteoarthritis  Take Ibuprofen 200 mg 2 tabs =400 mg every 6 hours as needed, take with food.  Follow up as needed

## 2018-04-14 NOTE — Telephone Encounter (Signed)
Pt was in today for OV and A1c given

## 2018-04-14 NOTE — Progress Notes (Signed)
[facility] clinic  Provider:   Monina C. Medina-Vargas - FNP   Goals of Care:  Advanced Directives 04/14/2018  Does Patient Have a Medical Advance Directive? Yes  Type of Advance Directive -  Does patient want to make changes to medical advance directive? No - Patient declined  Copy of [place] in Chart? -     Chief Complaint  Patient presents with  . Acute Visit    back pain radiating down her legs,pain in shoulders,symptoms started  3 months ago,discuss blood sugar readings     HPI: Patient is a 73 y.o. female seen today for an acute visit for bilateral shoulder pains that runs through the arms which makes it difficult to sleep. Four months ago she had lower back pains and was given prednisone X 5 days. Her blood sugar was in the  200s when she was on Prednisone and does not want to be on it. Her mother and father have rheumatoid arthritis.She said she had stopped taking jardiance due to nausea and constipation. She is currently on Metformin for her diabetes. Her blood sugar this morning was 140.   Past Medical History:  Diagnosis Date  . Allergic rhinitis   . Anxiety   . Atrophic vaginitis 2017  . Depression   . GERD (gastroesophageal reflux disease)   . Hematuria 05/09/2015   had two nonobstructing renal calculi  . Hepatitis A 1970s  . Hyperlipidemia   . Osteopenia   . Shingles 06/04/2015  . SUI (stress urinary incontinence, female) 2017  . Thrombocytopenia ([place])     Past Surgical History:  Procedure Laterality Date  . CATARACT EXTRACTION W/ INTRAOCULAR LENS  IMPLANT, BILATERAL Bilateral   . KNEE ARTHROSCOPY  2004, 1990s  . TONSILLECTOMY AND ADENOIDECTOMY  1952    Allergies  Allergen Reactions  . Keflex [Cephalexin]   . Sulfa Antibiotics     Outpatient Encounter Medications as of 04/14/2018  Medication Sig  . ALPRAZolam (XANAX) 0.25 MG tablet Take 0.5 tablets (0.125 mg total) by mouth daily as needed for anxiety.  Marland Kitchen atorvastatin (LIPITOR) 20 MG  tablet TAKE 1 TABLET BY MOUTH  DAILY  . esomeprazole (NEXIUM) 20 MG capsule Take 1 capsule (20 mg total) by mouth daily at 12 noon.  Marland Kitchen FLUoxetine (PROZAC) 40 MG capsule TAKE 1 CAPSULE BY MOUTH  DAILY  . ketoconazole (NIZORAL) 2 % cream Apply 1 application topically 2 (two) times daily as needed (groin rash).  Marland Kitchen losartan (COZAAR) 25 MG tablet TAKE 2 TABLETS BY MOUTH IN  THE MORNING AND 1 TABLET IN THE EVENING  . metFORMIN (GLUCOPHAGE) 1000 MG tablet Take 1 tablet (1,000 mg total) by mouth 2 (two) times daily with a meal.  . Multiple Vitamins-Minerals (WOMENS 50+ MULTI VITAMIN/MIN PO) Take by mouth.  . empagliflozin (JARDIANCE) 10 MG TABS tablet Take one tablet by mouth once daily (Patient not taking: Reported on 04/14/2018)  . omeprazole (PRILOSEC) 20 MG capsule Take 20 mg by mouth daily.  . predniSONE (STERAPRED UNI-PAK 21 TAB) 10 MG (21) TBPK tablet Use as directed (Patient not taking: Reported on 04/14/2018)   No facility-administered encounter medications on file as of 04/14/2018.     Review of Systems:  Review of Systems  Constitutional: Negative for activity change, appetite change and fever.  HENT: Negative for congestion and postnasal drip.   Eyes: Negative.   Respiratory: Negative for cough, shortness of breath and wheezing.   Cardiovascular: Negative for leg swelling.  Gastrointestinal: Negative for abdominal distention,  constipation, diarrhea and vomiting.  Endocrine: Negative.   Genitourinary: Negative for difficulty urinating.  Musculoskeletal: Positive for arthralgias and back pain. Negative for gait problem and joint swelling.  Skin: Negative for color change and rash.  Neurological: Negative for tremors and headaches.  Hematological: Negative for adenopathy. Does not bruise/bleed easily.  Psychiatric/Behavioral: Negative.     Health Maintenance  Topic Date Due  . Hepatitis C Screening  01/01/1946  . OPHTHALMOLOGY EXAM  12/05/2013  . TETANUS/TDAP  04/06/2017  . HEMOGLOBIN  A1C  10/11/2018  . FOOT EXAM  12/23/2018  . MAMMOGRAM  09/18/2019  . COLONOSCOPY  09/30/2023  . INFLUENZA VACCINE  Completed  . DEXA SCAN  Completed  . PNA vac Low Risk Adult  Completed    Physical Exam: Vitals:   04/14/18 1336  BP: 140/88  Pulse: (!) 109  SpO2: 95%  Weight: 133 lb 12.8 oz (60.7 kg)  Height: 5\' 5"  (1.651 m)   Body mass index is 22.27 kg/m. Physical Exam Vitals signs reviewed.  Constitutional:      Appearance: Normal appearance. She is normal weight.  HENT:     Head: Normocephalic.     Nose: Nose normal.     Mouth/Throat:     Mouth: Mucous membranes are moist.     Pharynx: Oropharynx is clear.  Neck:     Musculoskeletal: Normal range of motion and neck supple. No neck rigidity or muscular tenderness.  Cardiovascular:     Rate and Rhythm: Normal rate and regular rhythm.     Pulses: Normal pulses.     Heart sounds: Normal heart sounds.  Pulmonary:     Effort: Pulmonary effort is normal.     Breath sounds: Normal breath sounds.  Abdominal:     Palpations: Abdomen is soft.  Musculoskeletal: Normal range of motion.        General: No swelling.     Comments: Bilateral shoulders painful, 6/10  Lymphadenopathy:     Cervical: No cervical adenopathy.  Skin:    General: Skin is warm and dry.  Neurological:     General: No focal deficit present.     Mental Status: She is alert and oriented to person, place, and time.     Motor: No weakness.  Psychiatric:        Mood and Affect: Mood normal.        Behavior: Behavior normal.        Thought Content: Thought content normal.        Judgment: Judgment normal.     Labs reviewed: Basic Metabolic Panel: Recent Labs    09/07/17 0700 12/14/17  NA 141 140  K 4.7 4.4  BUN 17 19  CREATININE 0.8 0.8   Liver Function Tests: Recent Labs    09/07/17 0700  AST 22  ALT 25  ALKPHOS 67   CBC: Recent Labs    09/07/17  WBC 6.2  HGB 13.0  HCT 38  PLT 355   Lipid Panel: Recent Labs    09/07/17  CHOL  135  HDL 52  LDLCALC 56  TRIG 138   Lab Results  Component Value Date   HGBA1C 7.1 04/12/2018     Assessment/Plan  1. Primary osteoarthritis involving multiple joints - ibuprofen (ADVIL) 200 MG tablet; Take 2 tablets (400 mg total) by mouth every 6 (six) hours as needed for up to 14 days.  Dispense: 112 tablet; Refill: 0 - diclofenac sodium (VOLTAREN) 1 % GEL; Apply 2 g topically 3 (three) times  daily as needed for up to 21 days.  Dispense: 100 g; Refill: 2 - Follow-up as needed - talked about gluten free diet and elimination of concentrated sweets from her diet   Next appt:  04/20/2018

## 2018-04-15 DIAGNOSIS — D649 Anemia, unspecified: Secondary | ICD-10-CM | POA: Diagnosis not present

## 2018-04-15 DIAGNOSIS — Z79899 Other long term (current) drug therapy: Secondary | ICD-10-CM | POA: Diagnosis not present

## 2018-04-15 LAB — BASIC METABOLIC PANEL
BUN: 19 (ref 4–21)
Creatinine: 0.7 (ref 0.5–1.1)
Glucose: 136
Potassium: 4.7 (ref 3.4–5.3)
Sodium: 138 (ref 137–147)

## 2018-04-18 ENCOUNTER — Ambulatory Visit: Payer: Medicare Other | Admitting: Physical Therapy

## 2018-04-18 ENCOUNTER — Encounter: Payer: Self-pay | Admitting: Internal Medicine

## 2018-04-20 ENCOUNTER — Ambulatory Visit
Admission: RE | Admit: 2018-04-20 | Discharge: 2018-04-20 | Disposition: A | Discharge status: Home / Self Care | Payer: Medicare Other | Source: Ambulatory Visit | Attending: Internal Medicine | Admitting: Internal Medicine

## 2018-04-20 ENCOUNTER — Encounter: Payer: Self-pay | Admitting: Internal Medicine

## 2018-04-20 ENCOUNTER — Encounter: Payer: Self-pay | Admitting: *Deleted

## 2018-04-20 ENCOUNTER — Non-Acute Institutional Stay: Payer: Medicare Other | Admitting: Internal Medicine

## 2018-04-20 VITALS — BP 130/80 | HR 74 | Temp 98.0°F | Ht 65.0 in | Wt 132.0 lb

## 2018-04-20 DIAGNOSIS — G8929 Other chronic pain: Secondary | ICD-10-CM

## 2018-04-20 DIAGNOSIS — M5412 Radiculopathy, cervical region: Secondary | ICD-10-CM

## 2018-04-20 DIAGNOSIS — Z8261 Family history of arthritis: Secondary | ICD-10-CM

## 2018-04-20 DIAGNOSIS — M150 Primary generalized (osteo)arthritis: Secondary | ICD-10-CM | POA: Diagnosis not present

## 2018-04-20 DIAGNOSIS — M5441 Lumbago with sciatica, right side: Secondary | ICD-10-CM | POA: Diagnosis not present

## 2018-04-20 DIAGNOSIS — M5136 Other intervertebral disc degeneration, lumbar region: Secondary | ICD-10-CM | POA: Diagnosis not present

## 2018-04-20 DIAGNOSIS — E119 Type 2 diabetes mellitus without complications: Secondary | ICD-10-CM | POA: Diagnosis not present

## 2018-04-20 DIAGNOSIS — M8949 Other hypertrophic osteoarthropathy, multiple sites: Secondary | ICD-10-CM

## 2018-04-20 DIAGNOSIS — E785 Hyperlipidemia, unspecified: Secondary | ICD-10-CM

## 2018-04-20 DIAGNOSIS — R457 State of emotional shock and stress, unspecified: Secondary | ICD-10-CM

## 2018-04-20 DIAGNOSIS — M159 Polyosteoarthritis, unspecified: Secondary | ICD-10-CM

## 2018-04-20 DIAGNOSIS — E1169 Type 2 diabetes mellitus with other specified complication: Secondary | ICD-10-CM | POA: Diagnosis not present

## 2018-04-20 DIAGNOSIS — M5030 Other cervical disc degeneration, unspecified cervical region: Secondary | ICD-10-CM | POA: Diagnosis not present

## 2018-04-20 MED ORDER — TRAMADOL HCL 50 MG PO TABS: 50.0000 mg | ORAL_TABLET | Freq: Four times a day (QID) | ORAL | 0 refills | Status: DC | PRN

## 2018-04-20 NOTE — Patient Instructions (Addendum)
We'll check rheumatoid factor, a sed rate and C reactive protein which are measures of inflammation and autoimmune disease.    I'm going to give you a 5 day supply of tramadol to take for your pain when it is severe and the gel does not work.  Also, please go to [place], [place] [address] for the xrays of your neck and lower back.  This is walk-in.

## 2018-04-21 DIAGNOSIS — M2550 Pain in unspecified joint: Secondary | ICD-10-CM | POA: Diagnosis not present

## 2018-04-21 DIAGNOSIS — Z8261 Family history of arthritis: Secondary | ICD-10-CM | POA: Diagnosis not present

## 2018-04-22 ENCOUNTER — Ambulatory Visit: Payer: Medicare Other | Admitting: Physical Therapy

## 2018-04-22 DIAGNOSIS — M5442 Lumbago with sciatica, left side: Principal | ICD-10-CM

## 2018-04-22 DIAGNOSIS — G8929 Other chronic pain: Secondary | ICD-10-CM | POA: Diagnosis not present

## 2018-04-22 DIAGNOSIS — R293 Abnormal posture: Secondary | ICD-10-CM | POA: Diagnosis not present

## 2018-04-22 DIAGNOSIS — M6281 Muscle weakness (generalized): Secondary | ICD-10-CM

## 2018-04-22 DIAGNOSIS — M5441 Lumbago with sciatica, right side: Secondary | ICD-10-CM | POA: Diagnosis not present

## 2018-04-22 NOTE — Therapy (Signed)
[facility] [address] [place], [place], [address] Phone: [phone]   Fax:  [phone]  Physical Therapy Treatment  Patient Details  Name: Kimberly [name] MRN: [id number] Date of Birth: 1946-03-14 Referring Provider (PT): Lauree Chandler, NP   Encounter Date: 04/22/2018  PT End of Session - 04/22/18 1017    Visit Number  7    Date for PT Re-Evaluation  05/16/18    PT Start Time  1017    PT Stop Time  1110    PT Time Calculation (min)  53 min    Activity Tolerance  Patient tolerated treatment well;No increased pain    Behavior During Therapy  WFL for tasks assessed/performed       Past Medical History:  Diagnosis Date  . Allergic rhinitis   . Anxiety   . Atrophic vaginitis 2017  . Depression   . GERD (gastroesophageal reflux disease)   . Hematuria 05/09/2015   had two nonobstructing renal calculi  . Hepatitis A 1970s  . Hyperlipidemia   . Osteopenia   . Shingles 06/04/2015  . SUI (stress urinary incontinence, female) 2017  . Thrombocytopenia ([place])     Past Surgical History:  Procedure Laterality Date  . CATARACT EXTRACTION W/ INTRAOCULAR LENS  IMPLANT, BILATERAL Bilateral   . KNEE ARTHROSCOPY  2004, 1990s  . TONSILLECTOMY AND ADENOIDECTOMY  1952    There were no vitals filed for this visit.  Subjective Assessment - 04/22/18 1018    Subjective  I have seen 2 doctors for my shoulders, then saw MD at [facility] for my back. They are checking for Rheumatoid arthritis. No word on results yet. Cannot do HEP bc of pain..Last session the manual work felt good but she did not get much relief once she left the clinic. The estim ( she felt) gave  her a few hours of relief.     Currently in Pain?  --   Shoulders 8/10, Low back 3/10, legs 8-9/10  all bilateral.    Pain Descriptors / Indicators  --   Wide spread   Aggravating Factors   Not sure, pretty constant    Pain Relieving Factors  Nothing    Multiple Pain Sites  No                       [facility] Adult PT Treatment/Exercise - 04/22/18 0001      Lumbar Exercises: Stretches   Active Hamstring Stretch  Right;Left;3 reps;20 seconds   Seated   Single Knee to Chest Stretch  Right;Left;3 reps;20 seconds    Lower Trunk Rotation  --   Rocking only 10x, 2 sets. Static hold was too painful,      Moist Heat Therapy   Number Minutes Moist Heat  20 Minutes    Moist Heat Location  Lumbar Spine      Electrical Stimulation   Electrical Stimulation Location  Mid thoracic to mid gluteal    Electrical Stimulation Action  IFC    Electrical Stimulation Parameters  80-150 Hz in hooklying    Electrical Stimulation Goals  Pain               PT Short Term Goals - 03/31/18 1314      PT SHORT TERM GOAL #1   Title  ind with initial HEP    Status  Achieved      PT SHORT TERM GOAL #2   Title  Pt will demonstrate Rt LE  hip abduction of 4+/5 MMT for improved stability during functional tasks    Status  On-going        PT Long Term Goals - 03/21/18 1543      PT LONG TERM GOAL #1   Title  pt will be able to get out of bed with 60% less pain    Baseline  falls to the ground with pain 10/10    Time  8    Period  Weeks    Status  New    Target Date  05/16/18      PT LONG TERM GOAL #2   Title  Pt will be able to lift 10 lb with good body mechanics for improved function and reduced risk of injury during functional tasks    Time  8    Period  Weeks    Status  New    Target Date  05/16/18      PT LONG TERM GOAL #3   Title  Pt will report < or = to 3/10 pain at most during typical day    Baseline  5/10 pain    Time  8    Period  Weeks    Status  New    Target Date  05/16/18      PT LONG TERM GOAL #4   Title  Pt will report < or = to 46% limited    Time  8    Period  Weeks    Status  New    Target Date  05/16/18            Plan - 04/22/18 1017    Clinical Impression Statement  Pt has recently seen  multiple MDs for her unresolved pain both at the shoulders and low back/legs. RA is being ruled out, she is currently waiting on lab results to come back. In th emean time she hurts and cannot do her HEP because of the pain. PTA encouraged her to do some gentle stretching to keep her from getting stiff. She agreed. We modified some current stretches to either ease her shoulder work or lessen strain to her back. She demonstrated three exercises she will do at home;  Seated ham stretch bil, supine Single knee to chest with opposite knee bent, and supine lower trunk rocking, no holding.     Rehab Potential  Excellent    PT Frequency  2x / week    PT Duration  8 weeks    PT Treatment/Interventions  ADLs/Self Care Home Management;Cryotherapy;Biofeedback;Electrical Stimulation;Moist Heat;Traction;Therapeutic activities;Therapeutic exercise;Functional mobility training;Neuromuscular re-education;Patient/family education;Manual techniques;Dry needling;Passive range of motion;Taping    PT Next Visit Plan  See if pt was compliant with 3 stretches we agreed on. Follow up with any test results she may ave gotten back, see if pt can tolerate any more exercises, estim at end.    PT Home Exercise Plan   Access Code: [email or web address]     Consulted and Agree with Plan of Care  Patient       Patient will benefit from skilled therapeutic intervention in order to improve the following deficits and impairments:  Abnormal gait, Increased fascial restricitons, Pain, Improper body mechanics, Increased muscle spasms, Postural dysfunction, Decreased coordination, Decreased range of motion, Decreased strength, Impaired flexibility  Visit Diagnosis: Chronic bilateral low back pain with bilateral sciatica  Muscle weakness (generalized)  Abnormal posture     Problem List Patient Active Problem List   Diagnosis Date Noted  . 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  Uncontrolled hypertension 11/22/2016  . Adjustment disorder  with anxious mood 02/04/2016  . Hyperlipidemia associated with type 2 diabetes mellitus ([place]) 02/04/2016  . Type 2 diabetes mellitus without complication, without long-term current use of insulin ([place]) 02/04/2016  . Basal cell carcinoma of left nasal sidewall 10/22/2011    Kada Friesen, PTA 04/22/2018, 10:54 AM  [facility] [address] [place], [place], [address] Phone: [phone]   Fax:  [phone]  Name: FADUMO HENG MRN: [id number] Date of Birth: 13-Feb-1946  Access Code: [email or web address]  URL: [email or web address]  Date: 04/22/2018  Prepared by: Myrene Galas   Exercises  Supine Hamstring Stretch with Strap - 3 reps - 1 sets - 30 sec hold - 1x daily - 7x weekly  Supine Figure 4 Piriformis Stretch - 3 reps - 1 sets - 30 sec hold - 1x daily - 7x weekly  Supine Single Knee to Chest Stretch - 5 reps - 1 sets - 10 sec hold - 2x daily - 7x weekly  Hooklying Small March - 10 reps - 2 sets - 1x daily - 7x weekly  Standing Row with Anchored Resistance - 10 reps - 3 sets - 1x daily - 7x weekly  Standing Shoulder Extension with Resistance - 10 reps - 3 sets - 1x daily - 7x weekly  Supine Lower Trunk Rotation - 10 reps - 3 sets - 1x daily - 7x weekly  Sidelying Thoracic Rotation with Open Book - 5 reps - 1 sets - 10 sec hold - 1x daily - 7x weekly  Child's Pose Stretch - 10 reps - 3 sets - 1x daily - 7x weekly  Cat-Camel - 10 reps - 3 sets - 1x daily - 7x weekly  Seated Hamstring Stretch - 3 reps - 1 sets - 20 hold - 2x daily - 7x weekly  Supine Piriformis Stretch with Foot on Ground - 3 reps - 1 sets - 20 hold - 2x daily - 7x weekly

## 2018-04-23 ENCOUNTER — Encounter: Payer: Self-pay | Admitting: Internal Medicine

## 2018-04-26 ENCOUNTER — Encounter: Payer: Self-pay | Admitting: Physical Therapy

## 2018-04-26 ENCOUNTER — Telehealth: Payer: Self-pay | Admitting: *Deleted

## 2018-04-26 ENCOUNTER — Ambulatory Visit: Payer: Medicare Other | Admitting: Physical Therapy

## 2018-04-26 DIAGNOSIS — R293 Abnormal posture: Secondary | ICD-10-CM | POA: Diagnosis not present

## 2018-04-26 DIAGNOSIS — M5442 Lumbago with sciatica, left side: Secondary | ICD-10-CM | POA: Diagnosis not present

## 2018-04-26 DIAGNOSIS — M6281 Muscle weakness (generalized): Secondary | ICD-10-CM | POA: Diagnosis not present

## 2018-04-26 DIAGNOSIS — G8929 Other chronic pain: Secondary | ICD-10-CM

## 2018-04-26 DIAGNOSIS — M5441 Lumbago with sciatica, right side: Secondary | ICD-10-CM | POA: Diagnosis not present

## 2018-04-26 NOTE — Telephone Encounter (Signed)
.  left message to have patient return my call.   Calling pt about lab results, (labs can't be abstracted) Per Dr. Mariea Clonts   Rheumatoid Factor <14 "negative rheumatoid factor, all results suggest osteoarthritis (age related arthritis)"  CRP latex result-NON-REACTIVE  Sedimentation rate result 5 "ESR and CRP negative"

## 2018-04-26 NOTE — Therapy (Addendum)
[facility] [address] [place], [place], [address] Phone: [phone]   Fax:  [phone]  Physical Therapy Treatment  Patient Details  Name: Kimberly [name] MRN: [id number] Date of Birth: Jun 19, 1945 Referring Provider (PT): Lauree Chandler, NP   Encounter Date: 04/26/2018  PT End of Session - 04/26/18 1013    Visit Number  8    Date for PT Re-Evaluation  05/16/18    PT Start Time  1013    PT Stop Time  1108    PT Time Calculation (min)  55 min    Activity Tolerance  Patient tolerated treatment well;No increased pain    Behavior During Therapy  WFL for tasks assessed/performed       Past Medical History:  Diagnosis Date  . Allergic rhinitis   . Anxiety   . Atrophic vaginitis 2017  . Depression   . GERD (gastroesophageal reflux disease)   . Hematuria 05/09/2015   had two nonobstructing renal calculi  . Hepatitis A 1970s  . Hyperlipidemia   . Osteopenia   . Shingles 06/04/2015  . SUI (stress urinary incontinence, female) 2017  . Thrombocytopenia ([place])     Past Surgical History:  Procedure Laterality Date  . CATARACT EXTRACTION W/ INTRAOCULAR LENS  IMPLANT, BILATERAL Bilateral   . KNEE ARTHROSCOPY  2004, 1990s  . TONSILLECTOMY AND ADENOIDECTOMY  1952    There were no vitals filed for this visit.  Subjective Assessment - 04/26/18 1018    Subjective  I am feeling better today.  I don't have back pain just a little sore in my arms.  Imaging results show degenerative discs.      Patient Stated Goals  feel better    Currently in Pain?  No/denies   just a little in the arm with moving                      [facility] Adult PT Treatment/Exercise - 04/26/18 0001      Neck Exercises: Supine   Neck Retraction  20 reps;3 secs   educated and performed for improved posture     Lumbar Exercises: Stretches   Active Hamstring Stretch  Right;Left;3 reps;20 seconds   Seated   Single Knee to Chest  Stretch  Right;Left;3 reps;20 seconds    Lower Trunk Rotation  --   Rocking only10x, 2 sets     Lumbar Exercises: Supine   Pelvic Tilt  15 reps   cues to not lift entire pelvis off the matt     Moist Heat Therapy   Number Minutes Moist Heat  15 Minutes    Moist Heat Location  Lumbar Spine      Electrical Stimulation   Electrical Stimulation Location  lumbar to upper glute    Electrical Stimulation Action  IFC    Electrical Stimulation Parameters  to tolerance x 14 min    Electrical Stimulation Goals  Pain      Manual Therapy   Soft tissue mobilization  Lumbar paraspinals. bilat gluteals and hip, thoracolumar fascial release       Trigger Point Dry Needling - 04/26/18 1249    Consent Given?  Yes    Education Handout Provided  Yes    Muscles Treated Upper Body  Upper trapezius   cervical multifidi C5 bilat   Muscles Treated Lower Body  --   lumbar multifidi bilat   Upper Trapezius Response  Twitch reponse elicited;Palpable increased muscle length   bilat  PT Education - 04/26/18 1232    Education Details  access code [email or web address], dry needling info    Person(s) Educated  Patient    Methods  Explanation;Demonstration;Verbal cues;Handout    Comprehension  Verbalized understanding;Returned demonstration       PT Short Term Goals - 03/31/18 1314      PT SHORT TERM GOAL #1   Title  ind with initial HEP    Status  Achieved      PT SHORT TERM GOAL #2   Title  Pt will demonstrate Rt LE hip abduction of 4+/5 MMT for improved stability during functional tasks    Status  On-going        PT Long Term Goals - 04/26/18 1236      PT LONG TERM GOAL #1   Title  pt will be able to get out of bed with 60% less pain    Baseline  feels a little better    Status  On-going      PT LONG TERM GOAL #2   Title  Pt will be able to lift 10 lb with good body mechanics for improved function and reduced risk of injury during functional tasks    Status  On-going      PT LONG  TERM GOAL #3   Title  Pt will report < or = to 3/10 pain at most during typical day    Status  On-going      PT LONG TERM GOAL #4   Title  Pt will report < or = to 46% limited    Status  On-going            Plan - 04/26/18 1232    Clinical Impression Statement  Pt was feeling better today and her mind more at ease due to getting imaging result back.  She is still waiting for blood work to R/O RA.  Pt did well with dry needling getting instant relief.  Pt was able to do pelvic tilts with some difficulty and cervical retractions.  No increased pain and patient felt improved after treatment today.  Pt will benefit from skilled PT to add to gentle ROM and release of sof ttissue adhesions throughout spine for improved posture and decreased pain.    PT Treatment/Interventions  ADLs/Self Care Home Management;Cryotherapy;Biofeedback;Electrical Stimulation;Moist Heat;Traction;Therapeutic activities;Therapeutic exercise;Functional mobility training;Neuromuscular re-education;Patient/family education;Manual techniques;Dry needling;Passive range of motion;Taping    PT Next Visit Plan  f/u on dry needle and STM, pelvic tilt, ROM and gentle core strength    PT Home Exercise Plan   Access Code: [email or web address]     Consulted and Agree with Plan of Care  Patient       Patient will benefit from skilled therapeutic intervention in order to improve the following deficits and impairments:  Abnormal gait, Increased fascial restricitons, Pain, Improper body mechanics, Increased muscle spasms, Postural dysfunction, Decreased coordination, Decreased range of motion, Decreased strength, Impaired flexibility  Visit Diagnosis: Chronic bilateral low back pain with bilateral sciatica  Muscle weakness (generalized)  Abnormal posture     Problem List Patient Active Problem List   Diagnosis Date Noted  . 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 Uncontrolled hypertension 11/22/2016  . Adjustment disorder with anxious  mood 02/04/2016  . Hyperlipidemia associated with type 2 diabetes mellitus ([place]) 02/04/2016  . Type 2 diabetes mellitus without complication, without long-term current use of insulin ([place]) 02/04/2016  . Basal cell carcinoma of left nasal sidewall 10/22/2011    Zannie Cove, PT 04/26/2018, 1:22  PM  [facility] [address] [place], [place], [address] Phone: [phone]   Fax:  [phone]  Name: Kimberly [name] MRN: [id number] Date of Birth: [birth year]

## 2018-04-26 NOTE — Patient Instructions (Signed)

## 2018-04-28 ENCOUNTER — Encounter: Payer: Self-pay | Admitting: Internal Medicine

## 2018-04-28 ENCOUNTER — Other Ambulatory Visit: Payer: Self-pay | Admitting: Internal Medicine

## 2018-04-28 DIAGNOSIS — M5441 Lumbago with sciatica, right side: Principal | ICD-10-CM

## 2018-04-28 DIAGNOSIS — G8929 Other chronic pain: Secondary | ICD-10-CM

## 2018-04-28 MED ORDER — TRAMADOL HCL 50 MG PO TABS: 50.0000 mg | ORAL_TABLET | Freq: Four times a day (QID) | ORAL | 0 refills | Status: DC | PRN

## 2018-04-28 NOTE — Telephone Encounter (Signed)
Left detatiled message on VM with results, advised pt to call if any questions

## 2018-04-29 ENCOUNTER — Ambulatory Visit: Payer: Medicare Other | Admitting: Physical Therapy

## 2018-04-29 ENCOUNTER — Encounter: Payer: Self-pay | Admitting: Physical Therapy

## 2018-04-29 DIAGNOSIS — M5441 Lumbago with sciatica, right side: Secondary | ICD-10-CM | POA: Diagnosis not present

## 2018-04-29 DIAGNOSIS — G8929 Other chronic pain: Secondary | ICD-10-CM

## 2018-04-29 DIAGNOSIS — R293 Abnormal posture: Secondary | ICD-10-CM

## 2018-04-29 DIAGNOSIS — M5442 Lumbago with sciatica, left side: Principal | ICD-10-CM

## 2018-04-29 DIAGNOSIS — M6281 Muscle weakness (generalized): Secondary | ICD-10-CM | POA: Diagnosis not present

## 2018-04-29 NOTE — Therapy (Signed)
[facility] [address] [place], [place], [address] Phone: [phone]   Fax:  [phone]  Physical Therapy Treatment  Patient Details  Name: Kimberly [name] MRN: [id number] Date of Birth: [birth year] Referring Provider (PT): Lauree Chandler, NP   Encounter Date: 04/29/2018  PT End of Session - 04/29/18 1017    Visit Number  9    Date for PT Re-Evaluation  05/16/18    PT Start Time  1016    PT Stop Time  1049    PT Time Calculation (min)  33 min    Activity Tolerance  Patient tolerated treatment well;No increased pain    Behavior During Therapy  WFL for tasks assessed/performed       Past Medical History:  Diagnosis Date  . Allergic rhinitis   . Anxiety   . Atrophic vaginitis 2017  . Depression   . GERD (gastroesophageal reflux disease)   . Hematuria 05/09/2015   had two nonobstructing renal calculi  . Hepatitis A 1970s  . Hyperlipidemia   . Osteopenia   . Shingles 06/04/2015  . SUI (stress urinary incontinence, female) 2017  . Thrombocytopenia ([place])     Past Surgical History:  Procedure Laterality Date  . CATARACT EXTRACTION W/ INTRAOCULAR LENS  IMPLANT, BILATERAL Bilateral   . KNEE ARTHROSCOPY  2004, 1990s  . TONSILLECTOMY AND ADENOIDECTOMY  1952    There were no vitals filed for this visit.  Subjective Assessment - 04/29/18 1020    Subjective  Back feels better, shoulder and arms hurt pretty bad.     Currently in Pain?  Yes   Arms and shoulders Bil   Pain Score  7     Pain Descriptors / Indicators  Sore    Aggravating Factors   Constant    Pain Relieving Factors  Laying supine/hooklying                       [facility] Adult PT Treatment/Exercise - 04/29/18 0001      Self-Care   Self-Care  Posture;Other Self-Care Comments    Posture  How to stand/sit with least compression to her spine.    PTA demo, pt verbally understood   Other Self-Care Comments   How to use  decompression position to help manage her pain. Pt demo and PTA verbally educated.  Discussed looking up Apps that are geared towards piansts and she could go forth with those exercises while she is in [place].    App on the computer for neck and shoulder ex for piansts.     Lumbar Exercises: Stretches   Active Hamstring Stretch  Right;Left;3 reps;20 seconds   Seated   Single Knee to Chest Stretch  Right;Left;3 reps;20 seconds    Lower Trunk Rotation  --   Rocking only10x, 2 sets   Piriformis Stretch  Right;Left;2 reps;30 seconds               PT Short Term Goals - 03/31/18 1314      PT SHORT TERM GOAL #1   Title  ind with initial HEP    Status  Achieved      PT SHORT TERM GOAL #2   Title  Pt will demonstrate Rt LE hip abduction of 4+/5 MMT for improved stability during functional tasks    Status  On-going        PT Long Term Goals - 04/26/18 1236      PT LONG TERM GOAL #1  Title  pt will be able to get out of bed with 60% less pain    Baseline  feels a little better    Status  On-going      PT LONG TERM GOAL #2   Title  Pt will be able to lift 10 lb with good body mechanics for improved function and reduced risk of injury during functional tasks    Status  On-going      PT LONG TERM GOAL #3   Title  Pt will report < or = to 3/10 pain at most during typical day    Status  On-going      PT LONG TERM GOAL #4   Title  Pt will report < or = to 46% limited    Status  On-going            Plan - 04/29/18 1050    Clinical Impression Statement  Pt reports better compliance with her home stretches. She presents today with no lumbar pain but remains with pretty significant shoulder pain. Spen the majoirty of the treatment educating pt on self care techniques she can use for a life time to help manage her degenerative spine: posture and postures that are decompressive , taking short but frequent rest breaks where she can lay hooklying  and images to use when she is  standing.  She also performed her LE stretches demonstrating independence.     Rehab Potential  Excellent    PT Frequency  2x / week    PT Duration  8 weeks    PT Treatment/Interventions  ADLs/Self Care Home Management;Cryotherapy;Biofeedback;Electrical Stimulation;Moist Heat;Traction;Therapeutic activities;Therapeutic exercise;Functional mobility training;Neuromuscular re-education;Patient/family education;Manual techniques;Dry needling;Passive range of motion;Taping    PT Next Visit Plan  ERO next, pt leaves for [place] next Thursday.     PT Home Exercise Plan   Access Code: [email or web address]     Consulted and Agree with Plan of Care  Patient       Patient will benefit from skilled therapeutic intervention in order to improve the following deficits and impairments:  Abnormal gait, Increased fascial restricitons, Pain, Improper body mechanics, Increased muscle spasms, Postural dysfunction, Decreased coordination, Decreased range of motion, Decreased strength, Impaired flexibility  Visit Diagnosis: Chronic bilateral low back pain with bilateral sciatica  Muscle weakness (generalized)  Abnormal posture     Problem List Patient Active Problem List   Diagnosis Date Noted  . 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 Uncontrolled hypertension 11/22/2016  . Adjustment disorder with anxious mood 02/04/2016  . Hyperlipidemia associated with type 2 diabetes mellitus ([place]) 02/04/2016  . Type 2 diabetes mellitus without complication, without long-term current use of insulin ([place]) 02/04/2016  . Basal cell carcinoma of left nasal sidewall 10/22/2011    COCHRAN,JENNIFER, PTA 04/29/2018, 11:02 AM  [facility] [address] [place], [place], [address] Phone: [phone]   Fax:  [phone]  Name: Kimberly [name] MRN: [id number] Date of Birth: July 28, 1945

## 2018-05-03 ENCOUNTER — Ambulatory Visit: Payer: Medicare Other | Admitting: Physical Therapy

## 2018-05-03 DIAGNOSIS — G8929 Other chronic pain: Secondary | ICD-10-CM

## 2018-05-03 DIAGNOSIS — M5441 Lumbago with sciatica, right side: Secondary | ICD-10-CM | POA: Diagnosis not present

## 2018-05-03 DIAGNOSIS — M5442 Lumbago with sciatica, left side: Principal | ICD-10-CM

## 2018-05-03 DIAGNOSIS — R293 Abnormal posture: Secondary | ICD-10-CM

## 2018-05-03 DIAGNOSIS — M6281 Muscle weakness (generalized): Secondary | ICD-10-CM | POA: Diagnosis not present

## 2018-05-03 NOTE — Patient Instructions (Signed)
   TENS 7000 on [employer]  Massage therapy:  Myofascial Craniosacral

## 2018-05-03 NOTE — Therapy (Signed)
[facility] [address], [name], [place], [address] Phone: [phone]   Fax:  [phone]  Physical Therapy Treatment Progress Note Reporting Period 03/21/18 to 05/03/18   See note below for Objective Data and Assessment of Progress/Goals.      Patient Details  Name: Kimberly [name] MRN: [id number] Date of Birth: 06/01/1945 Referring Provider (PT): Lauree Chandler, NP   Encounter Date: 05/03/2018  PT End of Session - 05/03/18 1012    Visit Number  10    Date for PT Re-Evaluation  05/16/18    PT Start Time  1013    PT Stop Time  1101    PT Time Calculation (min)  48 min       Past Medical History:  Diagnosis Date  . Allergic rhinitis   . Anxiety   . Atrophic vaginitis 2017  . Depression   . GERD (gastroesophageal reflux disease)   . Hematuria 05/09/2015   had two nonobstructing renal calculi  . Hepatitis A 1970s  . Hyperlipidemia   . Osteopenia   . Shingles 06/04/2015  . SUI (stress urinary incontinence, female) 2017  . Thrombocytopenia ([place])     Past Surgical History:  Procedure Laterality Date  . CATARACT EXTRACTION W/ INTRAOCULAR LENS  IMPLANT, BILATERAL Bilateral   . KNEE ARTHROSCOPY  2004, 1990s  . TONSILLECTOMY AND ADENOIDECTOMY  1952    There were no vitals filed for this visit.  Subjective Assessment - 05/03/18 1348    Subjective  I feel much better overall.  My back is more sore today.  I am 75% improved from when I started.    Patient Stated Goals  feel better    Currently in Pain?  Yes   no number reported, just sore                      [facility] Adult PT Treatment/Exercise - 05/03/18 0001      Self-Care   Other Self-Care Comments   discussed how to use home TENS unit and types of massage to get for maintenance      Moist Heat Therapy   Number Minutes Moist Heat  5 Minutes   with status update on goals and FOTO     Manual Therapy   Soft tissue mobilization   Lumbar paraspinals. bilat gluteals and hip, thoracolumar fascial release; cervical fascial release       Trigger Point Dry Needling - 05/03/18 1341    Consent Given?  Yes    Muscles Treated Lower Body  --   lumbar multifidi            PT Short Term Goals - 03/31/18 1314      PT SHORT TERM GOAL #1   Title  ind with initial HEP    Status  Achieved      PT SHORT TERM GOAL #2   Title  Pt will demonstrate Rt LE hip abduction of 4+/5 MMT for improved stability during functional tasks    Status  On-going        PT Long Term Goals - 05/03/18 1056      PT LONG TERM GOAL #1   Title  pt will be able to get out of bed with 60% less pain    Baseline  75% improved    Status  Achieved      PT LONG TERM GOAL #2   Title  Pt will be able to lift 10 lb  with good body mechanics for improved function and reduced risk of injury during functional tasks    Status  Achieved      PT LONG TERM GOAL #3   Title  Pt will report < or = to 3/10 pain at most during typical day    Baseline  2/10    Status  Achieved      PT LONG TERM GOAL #4   Title  Pt will report < or = to 46% limited    Baseline  33% limited    Status  Achieved            Plan - 05/03/18 1352    Clinical Impression Statement  Pt has achieved her goals.  She may still continue to benefit from skilled PT for further improvements but she is leaving for 2 months and will see how it goes with her HEP for now.    PT Treatment/Interventions  ADLs/Self Care Home Management;Cryotherapy;Biofeedback;Electrical Stimulation;Moist Heat;Traction;Therapeutic activities;Therapeutic exercise;Functional mobility training;Neuromuscular re-education;Patient/family education;Manual techniques;Dry needling;Passive range of motion;Taping    PT Home Exercise Plan   Access Code: [email or web address]     Consulted and Agree with Plan of Care  Patient       Patient will benefit from skilled therapeutic intervention in order to improve the following  deficits and impairments:  Abnormal gait, Increased fascial restricitons, Pain, Improper body mechanics, Increased muscle spasms, Postural dysfunction, Decreased coordination, Decreased range of motion, Decreased strength, Impaired flexibility  Visit Diagnosis: Chronic bilateral low back pain with bilateral sciatica  Muscle weakness (generalized)  Abnormal posture     Problem List Patient Active Problem List   Diagnosis Date Noted  . 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 Uncontrolled hypertension 11/22/2016  . Adjustment disorder with anxious mood 02/04/2016  . Hyperlipidemia associated with type 2 diabetes mellitus ([place]) 02/04/2016  . Type 2 diabetes mellitus without complication, without long-term current use of insulin ([place]) 02/04/2016  . Basal cell carcinoma of left nasal sidewall 10/22/2011    Zannie Cove, PT 05/03/2018, 1:54 PM  [facility] [address] [place], [place], [address] Phone: [phone]   Fax:  [phone]  Name: Kimberly [name] MRN: [id number] Date of Birth: [birth year]  PHYSICAL THERAPY DISCHARGE SUMMARY  Visits from Start of Care: 10  Current functional level related to goals / functional outcomes: See above details   Remaining deficits: See above   Education / Equipment: HEP  Plan: Patient agrees to discharge.  Patient goals were met. Patient is being discharged due to meeting the stated rehab goals.  ?????    [employer], PT 05/03/18 1:56 PM

## 2018-05-04 NOTE — Progress Notes (Signed)
Location:   [place] of Service:  Clinic (12)  Provider: Anavictoria Wilk L. Mariea Clonts, D.O., C.M.D.  Goals of Care:  Advanced Directives 04/14/2018  Does Patient Have a Medical Advance Directive? Yes  Type of Advance Directive -  Does patient want to make changes to medical advance directive? No - Patient declined  Copy of [place] in Chart? -     Chief Complaint  Patient presents with  . Medical Management of Chronic Issues    [facility] follow-up    HPI: Patient is a 73y.o. female seen today for med mgt of chronic diseases.  Back and leg pain is terrible.  Gets up and she can hardly walk due to pain in her back and into her legs.  She's now also having pain in her arms if she lifts them. She is sleeping with an ice pack and heating pad.  Her mother and sister have had arthritis.  Mother had it so bad she could not walk.  Her sister has been on celebrex for years and has to wear a wrist brace.    She is doing physical therapy.  She skipped Monday but plans to go Friday.  Avoiding gluten has not helped her.    She feels like crying all the time b/c she hurts so bad.    She has been using the voltaren on her arms but it does not last very long.  Taking 6 ibuprofen a day w/o benefit--has only a one hour window w/o pain.    hba1c did trend down.  Jardiance seeemed to cause constipation, nausea. She has lost weight into the 120s which she is happy with but the way she lost is not ideal.  On prednisone, bps high.  Sugars in upper 100s to 200s.    Past Medical History:  Diagnosis Date  . Allergic rhinitis   . Anxiety   . Atrophic vaginitis 2017  . Depression   . GERD (gastroesophageal reflux disease)   . Hematuria 05/09/2015   had two nonobstructing renal calculi  . Hepatitis A 1970s  . Hyperlipidemia   . Osteopenia   . Shingles 06/04/2015  . SUI (stress urinary incontinence, female) 2017  . Thrombocytopenia ([place]     Past Surgical History:    Procedure Laterality Date  . CATARACT EXTRACTION W/ INTRAOCULAR LENS  IMPLANT, BILATERAL Bilateral   . KNEE ARTHROSCOPY  2004, 1990s  . TONSILLECTOMY AND ADENOIDECTOMY  1952    Allergies  Allergen Reactions  . Keflex [Cephalexin]   . Sulfa Antibiotics     Outpatient Encounter Medications as of 04/20/2018  Medication Sig  . ALPRAZolam (XANAX) 0.25 MG tablet Take 0.5 tablets (0.125 mg total) by mouth daily as needed for anxiety.  .Marland Kitchenatorvastatin (LIPITOR) 20 MG tablet TAKE 1 TABLET BY MOUTH  DAILY  . diclofenac sodium (VOLTAREN) 1 % GEL Apply 2 g topically 3 (three) times daily as needed. Apply 2 gm topically to bilateral shoulders 3X/day as needed  . diclofenac sodium (VOLTAREN) 1 % GEL Apply 2 g topically 3 (three) times daily as needed for up to 21 days.  .Marland Kitchenesomeprazole (NEXIUM) 20 MG capsule Take 1 capsule (20 mg total) by mouth daily at 12 noon.  .Marland KitchenFLUoxetine (PROZAC) 40 MG capsule TAKE 1 CAPSULE BY MOUTH  DAILY  . [EXPIRED] ibuprofen (ADVIL) 200 MG tablet Take 2 tablets (400 mg total) by mouth every 6 (six) hours as needed for up to 14 days.  .Marland Kitchenketoconazole (NIZORAL)  2 % cream Apply 1 application topically 2 (two) times daily as needed (groin rash).  Marland Kitchen losartan (COZAAR) 25 MG tablet TAKE 2 TABLETS BY MOUTH IN  THE MORNING AND 1 TABLET IN THE EVENING  . metFORMIN (GLUCOPHAGE) 1000 MG tablet Take 1 tablet (1,000 mg total) by mouth 2 (two) times daily with a meal.  . Multiple Vitamins-Minerals (WOMENS 50+ MULTI VITAMIN/MIN PO) Take by mouth.  . [DISCONTINUED] empagliflozin (JARDIANCE) 10 MG TABS tablet Take one tablet by mouth once daily (Patient not taking: Reported on 04/14/2018)  . [DISCONTINUED] omeprazole (PRILOSEC) 20 MG capsule Take 20 mg by mouth daily.  . [DISCONTINUED] predniSONE (STERAPRED UNI-PAK 21 TAB) 10 MG (21) TBPK tablet Use as directed (Patient not taking: Reported on 04/14/2018)  . [DISCONTINUED] traMADol (ULTRAM) 50 MG tablet Take 1 tablet (50 mg total) by mouth every 6  (six) hours as needed for severe pain.   No facility-administered encounter medications on file as of 04/20/2018.     Review of Systems:  Review of Systems  Constitutional: Negative for chills, fever and malaise/fatigue.  HENT: Negative for hearing loss.   Eyes: Negative for blurred vision.  Respiratory: Negative for cough and shortness of breath.   Cardiovascular: Negative for chest pain, palpitations and leg swelling.  Gastrointestinal: Negative for abdominal pain, blood in stool, constipation, diarrhea and melena.  Genitourinary: Negative for dysuria.  Musculoskeletal: Positive for back pain, joint pain, myalgias and neck pain. Negative for falls.  Skin: Negative for itching and rash.  Neurological: Positive for tingling and sensory change. Negative for focal weakness, loss of consciousness, weakness and headaches.  Endo/Heme/Allergies: Does not bruise/bleed easily.  Psychiatric/Behavioral: Positive for depression. Negative for memory loss. The patient is nervous/anxious and has insomnia.     Health Maintenance  Topic Date Due  . Hepatitis C Screening  1945-09-13  . OPHTHALMOLOGY EXAM  12/05/2013  . TETANUS/TDAP  04/06/2017  . HEMOGLOBIN A1C  10/11/2018  . FOOT EXAM  12/23/2018  . MAMMOGRAM  09/18/2019  . COLONOSCOPY  09/30/2023  . INFLUENZA VACCINE  Completed  . DEXA SCAN  Completed  . PNA vac Low Risk Adult  Completed    Physical Exam: Vitals:   04/20/18 1131  BP: 130/80  Pulse: 74  Temp: 98 F (36.7 C)  TempSrc: Oral  SpO2: 98%  Weight: 132 lb (59.9 kg)  Height: 5' 5"  (1.651 m)   Body mass index is 21.97 kg/m. Physical Exam Vitals signs reviewed.  Constitutional:      Appearance: Normal appearance.  HENT:     Head: Normocephalic and atraumatic.  Neck:     Musculoskeletal: Normal range of motion. Muscular tenderness present.  Cardiovascular:     Rate and Rhythm: Normal rate and regular rhythm.     Pulses: Normal pulses.     Heart sounds: Normal heart  sounds.  Pulmonary:     Effort: Pulmonary effort is normal.     Breath sounds: Normal breath sounds.  Abdominal:     General: Bowel sounds are normal.     Palpations: Abdomen is soft.  Musculoskeletal: Normal range of motion.        General: No swelling or tenderness.  Skin:    General: Skin is warm and dry.  Neurological:     General: No focal deficit present.     Mental Status: She is alert and oriented to person, place, and time.     Cranial Nerves: No cranial nerve deficit.     Motor: No weakness.  Gait: Gait normal.  Psychiatric:     Comments: Fidgety and anxious     Labs reviewed: Basic Metabolic Panel: Recent Labs    09/07/17 0700 12/14/17 04/15/18  NA 141 140 138  K 4.7 4.4 4.7  BUN 17 19 19   CREATININE 0.8 0.8 0.7   Liver Function Tests: Recent Labs    09/07/17 0700  AST 22  ALT 25  ALKPHOS 67   No results for input(s): LIPASE, AMYLASE in the last 8760 hours. No results for input(s): AMMONIA in the last 8760 hours. CBC: Recent Labs    09/07/17  WBC 6.2  HGB 13.0  HCT 38  PLT 355   Lipid Panel: Recent Labs    09/07/17  CHOL 135  HDL 52  LDLCALC 56  TRIG 138   Lab Results  Component Value Date   HGBA1C 7.1 04/12/2018    Procedures since last visit: Dg Cervical Spine Complete  Result Date: 04/20/2018 CLINICAL DATA:  Chronic neck pain and bilateral cervical radiculopathy without known injury. EXAM: CERVICAL SPINE - COMPLETE 4+ VIEW COMPARISON:  None. FINDINGS: No fracture or significant spondylolisthesis is noted. No prevertebral soft tissue swelling is noted. Severe degenerative disc disease is noted at C4-5, C5-6 and C6-7 with anterior osteophyte formation. Mild bilateral neural foraminal stenosis is noted at C4-5, C5-6 and C6-7 secondary to uncovertebral spurring. IMPRESSION: Severe multilevel degenerative disc disease is noted with bilateral neural foraminal stenosis secondary to uncovertebral spurring as described above. No acute  abnormality seen in the cervical spine. Electronically Signed   By: Marijo Conception, M.D.   On: 04/20/2018 19:24   Dg Lumbar Spine Complete  Result Date: 04/20/2018 CLINICAL DATA:  Chronic right-sided low back pain with right-sided sciatica. EXAM: LUMBAR SPINE - COMPLETE 4+ VIEW COMPARISON:  None. FINDINGS: Grade 1 anterolisthesis of L4-5 is noted secondary to posterior facet joint hypertrophy. Moderate degenerative disc disease is noted at L4-5 and L5-S1. Mild degenerative disc disease is noted at L1-2 and L2-3. No fracture is noted. IMPRESSION: Multilevel degenerative disc disease. Grade 1 anterolisthesis of L4-5 secondary to posterior facet joint hypertrophy. No acute abnormality seen in the lumbar spine. Electronically Signed   By: Marijo Conception, M.D.   On: 04/20/2018 19:21    Assessment/Plan 1. Chronic right-sided low back pain with right-sided sciatica -does not have red flags, start with xrays, does have numbness, tingling in right leg to foot at times - DG Lumbar Spine Complete; Future -cont voltaren gel, tylenol not doing much, PT, recommended, finished steroid taper -advised not to use nsaids long-term -ok to use heat  2. Cervical radiculopathy -newly also having numbness and tingling in hands, does not reallly have localized neck pain - DG Cervical Spine Complete; Future -no loss of thenar eminence -due to pt's reported RA history, will check inflammatory markers with ESR, CRP and RF but expect them to be negative as she does not have classic RA findings  3. Hyperlipidemia associated with type 2 diabetes mellitus (HCC) -stable, cont lipitor  4. Type 2 diabetes mellitus without complication, without long-term current use of insulin (HCC) -hba1c trending down with diet, weight loss; cont metformin, did not tolerate jardiance unfortunately  5. Primary osteoarthritis involving multiple joints -cont voltaren gel for hands, feet  6. Caregiver stress syndrome -she has  considerable stress from Don's parkinson's disease and some personality changes he's suffered related to it -cont prn xanax very low dose as she's been taking  7. Family history of rheumatoid arthritis -check  labs as above, but doubt she's got this, looks like OA/DJD/DDD  Labs/tests ordered:  ESR,CRP, RF; cervical and lumbar xrays  Next appt:  04/26/2018  Laquanna Veazey L. Kallen Delatorre, D.O. [facility] [address], [place] [address] Cell Phone (Mon-Fri 8am-5pm):  [phone] On Call:  [phone] & follow prompts after 5pm & weekends Office Phone:  [phone] Office Fax:  [phone]

## 2018-05-05 ENCOUNTER — Encounter: Payer: Medicare Other | Admitting: Physical Therapy

## 2018-05-24 ENCOUNTER — Encounter: Payer: Self-pay | Admitting: Internal Medicine

## 2018-05-24 ENCOUNTER — Other Ambulatory Visit: Payer: Self-pay | Admitting: *Deleted

## 2018-05-24 DIAGNOSIS — M5441 Lumbago with sciatica, right side: Principal | ICD-10-CM

## 2018-05-24 DIAGNOSIS — G8929 Other chronic pain: Secondary | ICD-10-CM

## 2018-05-24 MED ORDER — TRAMADOL HCL 50 MG PO TABS: 50.0000 mg | ORAL_TABLET | Freq: Four times a day (QID) | ORAL | 0 refills | Status: DC | PRN

## 2018-05-24 NOTE — Telephone Encounter (Signed)
Left message on VM asking which pharmacy Tramadol refill needs to be sent to.

## 2018-05-25 ENCOUNTER — Encounter: Payer: Self-pay | Admitting: Internal Medicine

## 2018-06-01 ENCOUNTER — Encounter: Payer: Self-pay | Admitting: Internal Medicine

## 2018-06-01 DIAGNOSIS — M5441 Lumbago with sciatica, right side: Principal | ICD-10-CM

## 2018-06-01 DIAGNOSIS — G8929 Other chronic pain: Secondary | ICD-10-CM

## 2018-06-01 DIAGNOSIS — M5412 Radiculopathy, cervical region: Secondary | ICD-10-CM

## 2018-06-20 ENCOUNTER — Encounter: Payer: Self-pay | Admitting: Internal Medicine

## 2018-06-20 ENCOUNTER — Other Ambulatory Visit: Payer: Self-pay | Admitting: Internal Medicine

## 2018-06-20 DIAGNOSIS — I10 Essential (primary) hypertension: Secondary | ICD-10-CM

## 2018-06-22 ENCOUNTER — Other Ambulatory Visit: Payer: Self-pay | Admitting: *Deleted

## 2018-06-22 ENCOUNTER — Encounter: Payer: Self-pay | Admitting: Internal Medicine

## 2018-06-22 DIAGNOSIS — G8929 Other chronic pain: Secondary | ICD-10-CM

## 2018-06-22 DIAGNOSIS — M5441 Lumbago with sciatica, right side: Principal | ICD-10-CM

## 2018-06-22 MED ORDER — ALPRAZOLAM 0.25 MG PO TABS: 0.1250 mg | ORAL_TABLET | Freq: Every day | ORAL | 0 refills | Status: DC | PRN

## 2018-06-23 MED ORDER — TRAMADOL HCL 50 MG PO TABS: 50.0000 mg | ORAL_TABLET | Freq: Four times a day (QID) | ORAL | 0 refills | Status: DC | PRN

## 2018-06-23 NOTE — Telephone Encounter (Signed)
[place] Database verified and compliance confirmed   Last filled 04/28/2018   Routed to Dr.Reed

## 2018-07-06 ENCOUNTER — Other Ambulatory Visit: Payer: Medicare Other

## 2018-07-21 ENCOUNTER — Encounter: Payer: Self-pay | Admitting: Internal Medicine

## 2018-07-21 ENCOUNTER — Ambulatory Visit (INDEPENDENT_AMBULATORY_CARE_PROVIDER_SITE_OTHER): Payer: Medicare Other | Admitting: Internal Medicine

## 2018-07-21 ENCOUNTER — Other Ambulatory Visit: Payer: Self-pay

## 2018-07-21 DIAGNOSIS — M5441 Lumbago with sciatica, right side: Secondary | ICD-10-CM

## 2018-07-21 DIAGNOSIS — F4322 Adjustment disorder with anxiety: Secondary | ICD-10-CM | POA: Diagnosis not present

## 2018-07-21 DIAGNOSIS — M5412 Radiculopathy, cervical region: Secondary | ICD-10-CM | POA: Insufficient documentation

## 2018-07-21 DIAGNOSIS — G8929 Other chronic pain: Secondary | ICD-10-CM | POA: Diagnosis not present

## 2018-07-21 MED ORDER — PREGABALIN 50 MG PO CAPS: 50.0000 mg | ORAL_CAPSULE | Freq: Three times a day (TID) | ORAL | 0 refills | Status: DC

## 2018-07-21 NOTE — Progress Notes (Signed)
Patient ID: Kimberly [name], female   DOB: 1945-07-16, 73 y.o.   MRN: [id number] This service is provided via telemedicine  No vital signs collected/recorded due to the encounter was a telemedicine visit.   Location of patient (ex: home, work):  home  Patient consents to a telephone visit:  yes  Location of the provider (ex: office, home):  Office  Name of any referring provider:  Dr. Hollace Kinnier  Names of all persons participating in the telemedicine service and their role in the encounter:  Patient, Edwin Dada, CMA, Dr. Hollace Kinnier, DO  Time spent on call:  2:50    Provider:  Terralyn Matsumura L. Mariea Clonts, D.O., C.M.D.   Goals of Care:  Advanced Directives 04/14/2018  Does Patient Have a Medical Advance Directive? Yes  Type of Advance Directive -  Does patient want to make changes to medical advance directive? No - Patient declined  Copy of [place] in Chart? -     Chief Complaint  Patient presents with  . TELEPHONE VISIT    discuss back pain    HPI: Patient is a 73 y.o. female seen today for back pain that has gotten much worse.  She also continues to have neck pain and arm pain.  She started cutting her tramadol in two to savor them, but has now run out.  I had sent some 06/23/18, but she never was notified when I sent it to the pharmacy.      Pain now depends on the day--it's almost everywhere.  Her hips are where she cannot move to get out of bed--it is a sharp pain then.  If she can walk at all, she'll walk the dog a little after some excedrin or motrin.  She is anxious about it and that makes it worse.  She does also still have pain that goes into her arms.  The pain into her legs is sharp and makes her nearly fall to her knees.  Has to scream it hurts so bad.   Xrays from Jan15th reviewed again:   Lumbar--Multilevel degenerative disc disease. Grade 1 anterolisthesis of L4-5 secondary to posterior facet joint hypertrophy. No acute abnormality seen in the  lumbar spine.  Cervical:  Severe multilevel degenerative disc disease is noted with bilateral neural foraminal stenosis secondary to uncovertebral spurring as described above. No acute abnormality seen in the cervical spine.  She says she can't even walk first thing in the morning when she gets out of bed.  Her MRIs were postponed until May.  She is using excedrin and tylenol and now her stomach is getting upset from it.  She had been getting medical massages, but cannot do that anymore with the covid-19 pandemic.  She has had to crawl in her house.  She continues to try to support her husband who has Parkinson's disease.    No loss of bowel or bladder control or sensory losses in groin area.  Past Medical History:  Diagnosis Date  . Allergic rhinitis   . Anxiety   . Atrophic vaginitis 2017  . Depression   . GERD (gastroesophageal reflux disease)   . Hematuria 05/09/2015   had two nonobstructing renal calculi  . Hepatitis A 1970s  . Hyperlipidemia   . Osteopenia   . Shingles 06/04/2015  . SUI (stress urinary incontinence, female) 2017  . Thrombocytopenia ([place])     Past Surgical History:  Procedure Laterality Date  . CATARACT EXTRACTION W/ INTRAOCULAR LENS  IMPLANT, BILATERAL Bilateral   .  KNEE ARTHROSCOPY  2004, 1990s  . TONSILLECTOMY AND ADENOIDECTOMY  1952    Allergies  Allergen Reactions  . Keflex [Cephalexin]   . Sulfa Antibiotics     Outpatient Encounter Medications as of 07/21/2018  Medication Sig  . ALPRAZolam (XANAX) 0.25 MG tablet Take 0.5 tablets (0.125 mg total) by mouth daily as needed for anxiety.  Marland Kitchen atorvastatin (LIPITOR) 20 MG tablet TAKE 1 TABLET BY MOUTH  DAILY  . diclofenac sodium (VOLTAREN) 1 % GEL Apply 2 g topically 3 (three) times daily as needed. Apply 2 gm topically to bilateral shoulders 3X/day as needed  . esomeprazole (NEXIUM) 20 MG capsule Take 1 capsule (20 mg total) by mouth daily at 12 noon.  Marland Kitchen FLUoxetine (PROZAC) 40 MG capsule TAKE 1 CAPSULE  BY MOUTH  DAILY  . ketoconazole (NIZORAL) 2 % cream Apply 1 application topically 2 (two) times daily as needed (groin rash).  Marland Kitchen losartan (COZAAR) 25 MG tablet TAKE 2 TABLETS BY MOUTH IN  THE MORNING AND 1 TABLET BY MOUTH IN THE EVENING  . metFORMIN (GLUCOPHAGE) 1000 MG tablet Take 1 tablet (1,000 mg total) by mouth 2 (two) times daily with a meal.  . Multiple Vitamins-Minerals (WOMENS 50+ MULTI VITAMIN/MIN PO) Take by mouth.  . traMADol (ULTRAM) 50 MG tablet Take 1 tablet (50 mg total) by mouth every 6 (six) hours as needed for severe pain.   No facility-administered encounter medications on file as of 07/21/2018.     Review of Systems:  Review of Systems  Constitutional: Positive for malaise/fatigue. Negative for chills and fever.  HENT: Negative for hearing loss.   Eyes: Negative for blurred vision.  Respiratory: Negative for shortness of breath.   Cardiovascular: Negative for chest pain, palpitations and leg swelling.  Gastrointestinal: Negative for abdominal pain and constipation.  Genitourinary: Negative for dysuria.  Musculoskeletal: Positive for back pain, myalgias and neck pain. Negative for falls and joint pain.  Skin: Negative for rash.  Neurological: Positive for tingling and sensory change. Negative for dizziness, loss of consciousness and headaches.  Endo/Heme/Allergies: Does not bruise/bleed easily.  Psychiatric/Behavioral: Negative for depression and memory loss. The patient is nervous/anxious. The patient does not have insomnia.     Health Maintenance  Topic Date Due  . Hepatitis C Screening  1945-08-29  . OPHTHALMOLOGY EXAM  12/05/2013  . TETANUS/TDAP  04/06/2017  . HEMOGLOBIN A1C  10/11/2018  . INFLUENZA VACCINE  11/05/2018  . FOOT EXAM  12/23/2018  . MAMMOGRAM  09/18/2019  . COLONOSCOPY  09/30/2023  . DEXA SCAN  Completed  . PNA vac Low Risk Adult  Completed    Physical Exam: Could not be performed as visit non face-to-face via phone   Labs  reviewed: Basic Metabolic Panel: Recent Labs    09/07/17 0700 12/14/17 04/15/18  NA 141 140 138  K 4.7 4.4 4.7  BUN 17 19 19   CREATININE 0.8 0.8 0.7   Liver Function Tests: Recent Labs    09/07/17 0700  AST 22  ALT 25  ALKPHOS 67   No results for input(s): LIPASE, AMYLASE in the last 8760 hours. No results for input(s): AMMONIA in the last 8760 hours. CBC: Recent Labs    09/07/17  WBC 6.2  HGB 13.0  HCT 38  PLT 355   Lipid Panel: Recent Labs    09/07/17  CHOL 135  HDL 52  LDLCALC 56  TRIG 138   Lab Results  Component Value Date   HGBA1C 7.1 04/12/2018    Procedures  since last visit: No results found.  Assessment/Plan 1. Chronic right-sided low back pain with right-sided sciatica - she may pick up her tramadol that was sent in in March -also start lyrica for neuropathic component -be careful taking these together at first -may also use tylenol -avoid nsaids due to gi upset - pregabalin (LYRICA) 50 MG capsule; Take 1 capsule (50 mg total) by mouth 3 (three) times daily.  Dispense: 90 capsule; Refill: 0 - will refer to neurosurgery--perhaps phone or web visit can be done--pt's symptoms getting quite severe and she's crawling around her home due to pain level!  MRIs were postponed due to covid  2. Cervical radiculopathy - seems less bothered by her arms vs back and leg symptoms - pregabalin (LYRICA) 50 MG capsule; Take 1 capsule (50 mg total) by mouth 3 (three) times daily.  Dispense: 90 capsule; Refill: 0  3. Adjustment disorder with anxious mood -cont xanax for severe anxiety related to care of her husband with Parkinson's and adjusting to his increasing medical conditions and her own  Labs/tests ordered:   Orders Placed This Encounter  Procedures  . Ambulatory referral to Neurosurgery    Referral Priority:   Routine    Referral Type:   Surgical    Referral Reason:   Specialty Services Required    Requested Specialty:   Neurosurgery    Number of  Visits Requested:   1    Next appt:  08/24/2018 Non face-to-face time spent on televisit:  12 minutes  Coralynn Gaona L. Yehya Brendle, D.O. [facility] [address], [place] [address] Cell Phone (Mon-Fri 8am-5pm):  [phone] On Call:  [phone] & follow prompts after 5pm & weekends Office Phone:  [phone] Office Fax:  [phone]

## 2018-07-22 ENCOUNTER — Other Ambulatory Visit: Payer: Self-pay | Admitting: *Deleted

## 2018-07-22 DIAGNOSIS — M5441 Lumbago with sciatica, right side: Principal | ICD-10-CM

## 2018-07-22 DIAGNOSIS — M5412 Radiculopathy, cervical region: Secondary | ICD-10-CM

## 2018-07-22 DIAGNOSIS — G8929 Other chronic pain: Secondary | ICD-10-CM

## 2018-07-22 MED ORDER — PREGABALIN 50 MG PO CAPS: 50.0000 mg | ORAL_CAPSULE | Freq: Three times a day (TID) | ORAL | 0 refills | Status: DC

## 2018-07-24 ENCOUNTER — Encounter: Payer: Self-pay | Admitting: Internal Medicine

## 2018-07-24 DIAGNOSIS — M5441 Lumbago with sciatica, right side: Principal | ICD-10-CM

## 2018-07-24 DIAGNOSIS — G8929 Other chronic pain: Secondary | ICD-10-CM

## 2018-07-25 MED ORDER — TRAMADOL HCL 50 MG PO TABS: 100.0000 mg | ORAL_TABLET | Freq: Two times a day (BID) | ORAL | 0 refills | Status: DC

## 2018-07-27 ENCOUNTER — Encounter: Payer: Self-pay | Admitting: Internal Medicine

## 2018-07-27 NOTE — Telephone Encounter (Signed)
Routed to Reed, Tiffany L, DO  

## 2018-08-08 ENCOUNTER — Other Ambulatory Visit: Payer: Self-pay | Admitting: Internal Medicine

## 2018-08-08 ENCOUNTER — Encounter: Payer: Self-pay | Admitting: Internal Medicine

## 2018-08-08 DIAGNOSIS — M62838 Other muscle spasm: Secondary | ICD-10-CM

## 2018-08-08 MED ORDER — TIZANIDINE HCL 4 MG PO CAPS: 4.0000 mg | ORAL_CAPSULE | Freq: Three times a day (TID) | ORAL | 0 refills | Status: DC | PRN

## 2018-08-16 ENCOUNTER — Encounter: Payer: Self-pay | Admitting: Internal Medicine

## 2018-08-16 DIAGNOSIS — E785 Hyperlipidemia, unspecified: Secondary | ICD-10-CM | POA: Diagnosis not present

## 2018-08-16 DIAGNOSIS — E119 Type 2 diabetes mellitus without complications: Secondary | ICD-10-CM | POA: Diagnosis not present

## 2018-08-16 DIAGNOSIS — D649 Anemia, unspecified: Secondary | ICD-10-CM | POA: Diagnosis not present

## 2018-08-16 DIAGNOSIS — E1169 Type 2 diabetes mellitus with other specified complication: Secondary | ICD-10-CM | POA: Diagnosis not present

## 2018-08-16 DIAGNOSIS — I10 Essential (primary) hypertension: Secondary | ICD-10-CM | POA: Diagnosis not present

## 2018-08-16 LAB — LIPID PANEL
Cholesterol: 133 (ref 0–200)
HDL: 55 (ref 35–70)
LDL Cholesterol: 54
Triglycerides: 120 (ref 40–160)

## 2018-08-16 LAB — CBC AND DIFFERENTIAL
HCT: 37 (ref 36–46)
Hemoglobin: 12.4 (ref 12.0–16.0)
Platelets: 104 — AB (ref 150–399)
WBC: 6.8

## 2018-08-16 LAB — HEMOGLOBIN A1C: Hemoglobin A1C: 7.7

## 2018-08-16 LAB — BASIC METABOLIC PANEL
BUN: 18 (ref 4–21)
Creatinine: 0.7 (ref 0.5–1.1)
Glucose: 152
Potassium: 4.9 (ref 3.4–5.3)
Sodium: 143 (ref 137–147)

## 2018-08-18 LAB — BASIC METABOLIC PANEL  EGFR
Calcium: 9.1
Carbon Dioxide, Total: 24
Chloride: 102

## 2018-08-21 ENCOUNTER — Other Ambulatory Visit: Payer: Self-pay | Admitting: Internal Medicine

## 2018-08-24 ENCOUNTER — Non-Acute Institutional Stay: Payer: Medicare Other | Admitting: Internal Medicine

## 2018-08-24 ENCOUNTER — Encounter: Payer: Self-pay | Admitting: Internal Medicine

## 2018-08-24 ENCOUNTER — Other Ambulatory Visit: Payer: Self-pay

## 2018-08-24 VITALS — BP 130/70 | HR 78 | Temp 97.6°F | Ht 65.0 in | Wt 138.0 lb

## 2018-08-24 DIAGNOSIS — M150 Primary generalized (osteo)arthritis: Secondary | ICD-10-CM

## 2018-08-24 DIAGNOSIS — G8929 Other chronic pain: Secondary | ICD-10-CM

## 2018-08-24 DIAGNOSIS — M5442 Lumbago with sciatica, left side: Secondary | ICD-10-CM

## 2018-08-24 DIAGNOSIS — R457 State of emotional shock and stress, unspecified: Secondary | ICD-10-CM | POA: Diagnosis not present

## 2018-08-24 DIAGNOSIS — E1169 Type 2 diabetes mellitus with other specified complication: Secondary | ICD-10-CM | POA: Diagnosis not present

## 2018-08-24 DIAGNOSIS — M8949 Other hypertrophic osteoarthropathy, multiple sites: Secondary | ICD-10-CM

## 2018-08-24 DIAGNOSIS — M159 Polyosteoarthritis, unspecified: Secondary | ICD-10-CM

## 2018-08-24 DIAGNOSIS — E785 Hyperlipidemia, unspecified: Secondary | ICD-10-CM

## 2018-08-24 DIAGNOSIS — M5412 Radiculopathy, cervical region: Secondary | ICD-10-CM

## 2018-08-24 DIAGNOSIS — M5441 Lumbago with sciatica, right side: Secondary | ICD-10-CM

## 2018-08-24 MED ORDER — TRAMADOL HCL 50 MG PO TABS: 100.0000 mg | ORAL_TABLET | Freq: Two times a day (BID) | ORAL | 0 refills | Status: DC

## 2018-08-24 MED ORDER — EMPAGLIFLOZIN 10 MG PO TABS: 10.0000 mg | ORAL_TABLET | Freq: Every day | ORAL | 3 refills | Status: DC

## 2018-08-24 NOTE — Progress Notes (Signed)
Location:  [facility] of Service:  Clinic (12)  Provider: Denys Labree L. Mariea Clonts, D.O., C.M.D.  Goals of Care:  Advanced Directives 04/14/2018  Does Patient Have a Medical Advance Directive? Yes  Type of Advance Directive -  Does patient want to make changes to medical advance directive? No - Patient declined  Copy of [place] in Chart? -     Chief Complaint  Patient presents with  . Medical Management of Chronic Issues    81mth follow-up    HPI: Patient is a 73 y.o. female seen today for medical management of chronic diseases.    When she wakes up, she's doubled over with pain.  She's very limited in what she can do.  She hopes after her MRI, she can get a shot.  She is going to need two of her xanax before the test.    Labs reviewed with her.  Platelets low which appears new.   Electrolytes and kidneys are normal.  Cholesterol is at goal.  Sugar average up to 7.7 from 7.1 in Jan.  Had requested to stop her jardiance after sugar average improved and she'd lost weight.  Due to not being able to do anything, she's had this increase.  We also discussed pain contributing.  She agrees to go back on jardiance which she still has at home.  She is still walking the dog, but that's it.    Nerves and mood are bad.  Nerves are shot.  She is always worrying her husband has fallen. He fell going to get their food once.  When she counseled him about being alone if he falls and breaks something, she thinks that helped.  He was driving with the top down on the car when it was raining.   She worries about her memory which I explained is probably medication side effects.    Even when she goes to bed, she's constantly listening for him.  They sleep in separate rooms.    She sees neurosurgery sometime next week.     No new incontinence of bowel or bladder or difficulty urinating or having bms.  Has no saddle anesthesia.  Has some chronic stress and urge  incontinence that is quite mild.    Past Medical History:  Diagnosis Date  . Allergic rhinitis   . Anxiety   . Atrophic vaginitis 2017  . Depression   . GERD (gastroesophageal reflux disease)   . Hematuria 05/09/2015   had two nonobstructing renal calculi  . Hepatitis A 1970s  . Hyperlipidemia   . Osteopenia   . Shingles 06/04/2015  . SUI (stress urinary incontinence, female) 2017  . Thrombocytopenia ([place])     Past Surgical History:  Procedure Laterality Date  . CATARACT EXTRACTION W/ INTRAOCULAR LENS  IMPLANT, BILATERAL Bilateral   . KNEE ARTHROSCOPY  2004, 1990s  . TONSILLECTOMY AND ADENOIDECTOMY  1952    Allergies  Allergen Reactions  . Keflex [Cephalexin]   . Sulfa Antibiotics     Outpatient Encounter Medications as of 08/24/2018  Medication Sig  . ALPRAZolam (XANAX) 0.25 MG tablet Take 0.5 tablets (0.125 mg total) by mouth daily as needed for anxiety.  Marland Kitchen atorvastatin (LIPITOR) 20 MG tablet TAKE 1 TABLET BY MOUTH  DAILY  . esomeprazole (NEXIUM) 20 MG capsule Take 1 capsule (20 mg total) by mouth daily at 12 noon.  Marland Kitchen FLUoxetine (PROZAC) 40 MG capsule TAKE 1 CAPSULE BY MOUTH  DAILY  . ketoconazole (NIZORAL) 2 %  cream Apply 1 application topically 2 (two) times daily as needed (groin rash).  Marland Kitchen losartan (COZAAR) 25 MG tablet TAKE 2 TABLETS BY MOUTH IN  THE MORNING AND 1 TABLET BY MOUTH IN THE EVENING  . metFORMIN (GLUCOPHAGE) 1000 MG tablet TAKE 1 TABLET BY MOUTH TWO  TIMES DAILY WITH A MEAL  . Multiple Vitamins-Minerals (WOMENS 50+ MULTI VITAMIN/MIN PO) Take by mouth.  . traMADol (ULTRAM) 50 MG tablet Take 2 tablets (100 mg total) by mouth 2 (two) times daily.  . [DISCONTINUED] diclofenac sodium (VOLTAREN) 1 % GEL Apply 2 g topically 3 (three) times daily as needed. Apply 2 gm topically to bilateral shoulders 3X/day as needed  . [DISCONTINUED] pregabalin (LYRICA) 50 MG capsule Take 1 capsule (50 mg total) by mouth 3 (three) times daily.  . [DISCONTINUED] tiZANidine  (ZANAFLEX) 4 MG capsule Take 1 capsule (4 mg total) by mouth 3 (three) times daily as needed for muscle spasms.   No facility-administered encounter medications on file as of 08/24/2018.     Review of Systems:  Review of Systems  Constitutional: Positive for malaise/fatigue. Negative for chills and fever.  HENT: Negative for hearing loss.   Eyes: Negative for blurred vision.       Due for eye exam  Respiratory: Negative for cough and shortness of breath.   Cardiovascular: Negative for chest pain, palpitations and leg swelling.  Gastrointestinal: Negative for abdominal pain, blood in stool, constipation, diarrhea and melena.  Genitourinary: Negative for dysuria.  Musculoskeletal: Positive for back pain, joint pain, myalgias and neck pain. Negative for falls.  Skin: Negative for itching and rash.  Neurological: Positive for tingling and sensory change. Negative for dizziness, loss of consciousness, weakness and headaches.  Endo/Heme/Allergies: Does not bruise/bleed easily.  Psychiatric/Behavioral: Negative for depression and memory loss. The patient is nervous/anxious. The patient does not have insomnia.     Health Maintenance  Topic Date Due  . Hepatitis C Screening  12-17-1945  . OPHTHALMOLOGY EXAM  12/05/2013  . TETANUS/TDAP  04/06/2017  . INFLUENZA VACCINE  11/05/2018  . FOOT EXAM  12/23/2018  . HEMOGLOBIN A1C  02/16/2019  . MAMMOGRAM  09/18/2019  . COLONOSCOPY  09/30/2023  . DEXA SCAN  Completed  . PNA vac Low Risk Adult  Completed    Physical Exam: Vitals:   08/24/18 1120  BP: 130/70  Pulse: 78  Temp: 97.6 F (36.4 C)  TempSrc: Oral  SpO2: 98%  Weight: 138 lb (62.6 kg)  Height: 5\' 5"  (1.651 m)   Body mass index is 22.96 kg/m. Physical Exam Vitals signs reviewed.  Constitutional:      General: She is not in acute distress.    Appearance: Normal appearance. She is not toxic-appearing.  HENT:     Head: Normocephalic and atraumatic.  Cardiovascular:     Rate  and Rhythm: Normal rate and regular rhythm.     Pulses: Normal pulses.  Pulmonary:     Effort: Pulmonary effort is normal.     Breath sounds: Normal breath sounds.  Abdominal:     General: Bowel sounds are normal.  Musculoskeletal:        General: No swelling, tenderness or deformity.     Right lower leg: No edema.     Left lower leg: No edema.     Comments: Appears uncomfortable in lumbar region when she goes from sitting to standing and vice versa; no localized tenderness of neck, lower back on exam  Skin:    General: Skin is warm  and dry.  Neurological:     General: No focal deficit present.     Mental Status: She is alert and oriented to person, place, and time.  Psychiatric:     Comments: A bit fidgety and appears near tears at times     Labs reviewed: Basic Metabolic Panel: Recent Labs    12/14/17 04/15/18 08/16/18  NA 140 138 143  K 4.4 4.7 4.9  CL  --   --  102  CO2  --   --  24  BUN 19 19 18   CREATININE 0.8 0.7 0.7  CALCIUM  --   --  9.1   Liver Function Tests: Recent Labs    09/07/17 0700  AST 22  ALT 25  ALKPHOS 67   No results for input(s): LIPASE, AMYLASE in the last 8760 hours. No results for input(s): AMMONIA in the last 8760 hours. CBC: Recent Labs    09/07/17 08/16/18  WBC 6.2 6.8  HGB 13.0 12.4  HCT 38 37  PLT 355 104*   Lipid Panel: Recent Labs    09/07/17 08/16/18  CHOL 135 133  HDL 52 55  LDLCALC 56 54  TRIG 138 120   Lab Results  Component Value Date   HGBA1C 7.7 08/16/2018    Procedures since last visit: No results found.  Assessment/Plan 1. Acute bilateral low back pain with bilateral sciatica -no longer acute--more chronic at this point--pt reports some symptoms have been for 8 mos now, but MRIs and consults got delayed with her travel to [facility] and now with covid -cont tramadol 100mg  po bid, also using some otc tylenol and ibuprofen--has been educated not to overdo the nsaids (already on nexium for gerd as it is)  2.  Cervical radiculopathy -involves both arms, had a period of headaches and more neck pain, now primarily involves arms -MRI ordered and will be done this week -neurosurgery eval next week  3. Type 2 diabetes mellitus with other specified complication, without long-term current use of insulin (HCC) -hba1c trended back up with increased pain, less activity and being off the jardiance (had dropped considerably on it) -I put her back on jardiance 10mg  today especially since she may need a steroid injection for a temporary fix for her back and neck pains if neurosurgery believes this will be helpful -we also need good control if she's going to require surgery -cont metformin  4. Hyperlipidemia associated with type 2 diabetes mellitus ([place]) -LDL at goal with lipitor -has not been as active due to her pain recently and covid isolation  5. Caregiver stress syndrome -her husband has parkinson's and does not adhere to using his walker, falls frequently and she is constantly worried about him  6. Primary osteoarthritis involving multiple joints -less complaints here today, pain mostly in arms and legs of neuropathic nature now and in lower back  Labs/tests ordered: MRI C spine and L spine pending Next appt:  10/26/2018  Brocha Gilliam L. Taylee Gunnells, D.O. [facility] [address], [place] [address] Cell Phone (Mon-Fri 8am-5pm):  [phone] On Call:  [phone] & follow prompts after 5pm & weekends Office Phone:  [phone] Office Fax:  [phone]

## 2018-08-25 ENCOUNTER — Ambulatory Visit
Admission: RE | Admit: 2018-08-25 | Discharge: 2018-08-25 | Disposition: A | Discharge status: Home / Self Care | Payer: Medicare Other | Source: Ambulatory Visit | Attending: Internal Medicine | Admitting: Internal Medicine

## 2018-08-25 ENCOUNTER — Other Ambulatory Visit: Payer: Self-pay

## 2018-08-25 DIAGNOSIS — M4802 Spinal stenosis, cervical region: Secondary | ICD-10-CM | POA: Diagnosis not present

## 2018-08-25 DIAGNOSIS — G8929 Other chronic pain: Secondary | ICD-10-CM

## 2018-08-25 DIAGNOSIS — M5412 Radiculopathy, cervical region: Secondary | ICD-10-CM

## 2018-08-25 DIAGNOSIS — M48061 Spinal stenosis, lumbar region without neurogenic claudication: Secondary | ICD-10-CM | POA: Diagnosis not present

## 2018-08-26 ENCOUNTER — Encounter: Payer: Self-pay | Admitting: Internal Medicine

## 2018-08-26 NOTE — Telephone Encounter (Signed)
Routed to Reed, Tiffany L, DO  

## 2018-08-31 DIAGNOSIS — M4316 Spondylolisthesis, lumbar region: Secondary | ICD-10-CM | POA: Diagnosis not present

## 2018-09-01 ENCOUNTER — Encounter: Payer: Self-pay | Admitting: Internal Medicine

## 2018-09-02 DIAGNOSIS — M5416 Radiculopathy, lumbar region: Secondary | ICD-10-CM | POA: Diagnosis not present

## 2018-09-02 DIAGNOSIS — M4316 Spondylolisthesis, lumbar region: Secondary | ICD-10-CM | POA: Diagnosis not present

## 2018-09-02 DIAGNOSIS — M5116 Intervertebral disc disorders with radiculopathy, lumbar region: Secondary | ICD-10-CM | POA: Diagnosis not present

## 2018-09-17 ENCOUNTER — Encounter: Payer: Self-pay | Admitting: Internal Medicine

## 2018-09-19 ENCOUNTER — Other Ambulatory Visit: Payer: Self-pay | Admitting: Internal Medicine

## 2018-09-19 DIAGNOSIS — G8929 Other chronic pain: Secondary | ICD-10-CM

## 2018-09-19 MED ORDER — TRAMADOL HCL 50 MG PO TABS: 100.0000 mg | ORAL_TABLET | Freq: Two times a day (BID) | ORAL | 0 refills | Status: DC

## 2018-09-23 ENCOUNTER — Other Ambulatory Visit: Payer: Self-pay | Admitting: Internal Medicine

## 2018-09-23 DIAGNOSIS — B372 Candidiasis of skin and nail: Secondary | ICD-10-CM

## 2018-09-27 ENCOUNTER — Encounter: Payer: Self-pay | Admitting: Internal Medicine

## 2018-10-06 ENCOUNTER — Other Ambulatory Visit: Payer: Self-pay

## 2018-10-06 DIAGNOSIS — E785 Hyperlipidemia, unspecified: Secondary | ICD-10-CM | POA: Diagnosis not present

## 2018-10-06 DIAGNOSIS — R5382 Chronic fatigue, unspecified: Secondary | ICD-10-CM | POA: Diagnosis not present

## 2018-10-06 DIAGNOSIS — E039 Hypothyroidism, unspecified: Secondary | ICD-10-CM | POA: Diagnosis not present

## 2018-10-06 DIAGNOSIS — D649 Anemia, unspecified: Secondary | ICD-10-CM | POA: Diagnosis not present

## 2018-10-06 DIAGNOSIS — E1165 Type 2 diabetes mellitus with hyperglycemia: Secondary | ICD-10-CM | POA: Diagnosis not present

## 2018-10-06 DIAGNOSIS — E559 Vitamin D deficiency, unspecified: Secondary | ICD-10-CM | POA: Diagnosis not present

## 2018-10-11 ENCOUNTER — Other Ambulatory Visit: Payer: Self-pay | Admitting: Internal Medicine

## 2018-10-11 ENCOUNTER — Encounter: Payer: Self-pay | Admitting: Internal Medicine

## 2018-10-11 DIAGNOSIS — G8929 Other chronic pain: Secondary | ICD-10-CM

## 2018-10-11 MED ORDER — PREGABALIN 50 MG PO CAPS: 50.0000 mg | ORAL_CAPSULE | Freq: Three times a day (TID) | ORAL | 3 refills | Status: DC

## 2018-10-11 MED ORDER — TRAMADOL HCL 50 MG PO TABS: 100.0000 mg | ORAL_TABLET | Freq: Two times a day (BID) | ORAL | 0 refills | Status: DC

## 2018-10-13 ENCOUNTER — Encounter: Payer: Self-pay | Admitting: Internal Medicine

## 2018-10-13 DIAGNOSIS — M4316 Spondylolisthesis, lumbar region: Secondary | ICD-10-CM | POA: Diagnosis not present

## 2018-10-13 DIAGNOSIS — Z6823 Body mass index (BMI) 23.0-23.9, adult: Secondary | ICD-10-CM | POA: Diagnosis not present

## 2018-10-13 DIAGNOSIS — M5432 Sciatica, left side: Secondary | ICD-10-CM | POA: Diagnosis not present

## 2018-10-13 DIAGNOSIS — M5431 Sciatica, right side: Secondary | ICD-10-CM | POA: Diagnosis not present

## 2018-10-13 NOTE — Telephone Encounter (Signed)
Routed to Reed, Tiffany L, DO  

## 2018-10-14 ENCOUNTER — Telehealth: Payer: Self-pay | Admitting: *Deleted

## 2018-10-14 NOTE — Telephone Encounter (Signed)
.  left message to have patient return my call.  

## 2018-10-14 NOTE — Telephone Encounter (Signed)
This is true--she will need to sign a record release which can be done either at our office or at the neurosurgery office she is referring to--we need clarification about which office needs them.  This is required to prevent patient's medical information from being sent without their consent.

## 2018-10-14 NOTE — Telephone Encounter (Signed)
Patient called requesting to speak with [facility].  Stated that she needed all of her medical records faxed to a [facility] Dr. For upcoming surgery.   I told patient that she would need to sign a Medical Release form to have records faxed to another Dr. And she got angry and hung up on me.

## 2018-10-19 ENCOUNTER — Other Ambulatory Visit: Payer: Self-pay | Admitting: Internal Medicine

## 2018-10-19 DIAGNOSIS — G8929 Other chronic pain: Secondary | ICD-10-CM

## 2018-10-19 NOTE — Telephone Encounter (Signed)
Verified in [place] database Tramadol 50 mg tab last filled on 09/19/2018 for 60 tabs total. Verified patient's pharmacy [facility].

## 2018-10-19 NOTE — Telephone Encounter (Signed)
Prescription for the tramadol was sent 7/7 but I had noted to the pharmacy that it was not due to be dispensed until 7/15.  It seems like they could now fill that prescription and it would not need to be sent again today.

## 2018-10-26 ENCOUNTER — Encounter: Payer: Self-pay | Admitting: Internal Medicine

## 2018-10-26 ENCOUNTER — Other Ambulatory Visit: Payer: Self-pay

## 2018-10-26 ENCOUNTER — Non-Acute Institutional Stay: Payer: Medicare Other | Admitting: Internal Medicine

## 2018-10-26 VITALS — BP 120/80 | HR 99 | Temp 98.4°F | Ht 65.0 in | Wt 131.0 lb

## 2018-10-26 DIAGNOSIS — G8929 Other chronic pain: Secondary | ICD-10-CM | POA: Diagnosis not present

## 2018-10-26 DIAGNOSIS — Z01818 Encounter for other preprocedural examination: Secondary | ICD-10-CM

## 2018-10-26 DIAGNOSIS — M5441 Lumbago with sciatica, right side: Secondary | ICD-10-CM | POA: Diagnosis not present

## 2018-10-26 NOTE — Progress Notes (Signed)
Location:  Occupational psychologist of Service:  Clinic (12)  Provider: Librada Castronovo L. Mariea Clonts, D.O., C.M.D.  Goals of Care:  Advanced Directives 04/14/2018  Does Patient Have a Medical Advance Directive? Yes  Type of Advance Directive -  Does patient want to make changes to medical advance directive? No - Patient declined  Copy of [place] in Chart? -   Chief Complaint  Patient presents with  . Medical Management of Chronic Issues    75mth follow-up    HPI: Patient is a 73 y.o. female with h/o depression, anxiety, controlled diabetes and recent difficulty with both low back and neck and arm pain (radicular) seen today for medical management of chronic diseases and medical "clearance" for neurosurgery.  She is for an L4-S1 laminectomy and fusion with TLIF, pedicle screws and bmp, possible allograft with Dr. Sherlyn Lick at [facility] on 11/07/18.  DMII:   Lab Results  Component Value Date   HGBA1C 7.7 08/16/2018  apparently she also went to endocrine in b/w and hba1c was reportedly improved to 7.3 after I had started her on jardiance along with her metformin therapy.  She is tolerating both well.  Discussed that both must be held the day of her surgery.  She's also received other instructions from neurosurgery and will be seeing anesthesia preop for more guidance.  Her EKG was performed today with NSR at 96bpm with poor R wave progression noted.  She had no acute ischemia or infarct.  She's had no problems with chest pain or shortness of breath and has no cardiac history.  She has been limited in her activity recently due to a combination of her pain, caregiver support for her husband with Parkinson's and covid isolation protocols in her retirement community.  Cholesterol has been at goal.  Kidney function is normal.  Past Medical History:  Diagnosis Date  . Allergic rhinitis   . Anxiety   . Atrophic vaginitis 2017  . Depression   . GERD (gastroesophageal  reflux disease)   . Hematuria 05/09/2015   had two nonobstructing renal calculi  . Hepatitis A 1970s  . Hyperlipidemia   . Osteopenia   . Shingles 06/04/2015  . SUI (stress urinary incontinence, female) 2017  . Thrombocytopenia ([place])     Past Surgical History:  Procedure Laterality Date  . CATARACT EXTRACTION W/ INTRAOCULAR LENS  IMPLANT, BILATERAL Bilateral   . KNEE ARTHROSCOPY  2004, 1990s  . TONSILLECTOMY AND ADENOIDECTOMY  1952    Allergies  Allergen Reactions  . Keflex [Cephalexin]   . Sulfa Antibiotics     Outpatient Encounter Medications as of 10/26/2018  Medication Sig  . ALPRAZolam (XANAX) 0.25 MG tablet Take 0.5 tablets (0.125 mg total) by mouth daily as needed for anxiety.  Marland Kitchen atorvastatin (LIPITOR) 20 MG tablet TAKE 1 TABLET BY MOUTH  DAILY  . empagliflozin (JARDIANCE) 10 MG TABS tablet Take 10 mg by mouth daily.  Marland Kitchen esomeprazole (NEXIUM) 20 MG capsule Take 1 capsule (20 mg total) by mouth daily at 12 noon.  Marland Kitchen FLUoxetine (PROZAC) 40 MG capsule TAKE 1 CAPSULE BY MOUTH  DAILY  . ketoconazole (NIZORAL) 2 % cream APPLY 1 APPLICATION TOPICALLY 2 (TWO) TIMES DAILY AS NEEDED (GROIN RASH).  Marland Kitchen losartan (COZAAR) 25 MG tablet TAKE 2 TABLETS BY MOUTH IN  THE MORNING AND 1 TABLET BY MOUTH IN THE EVENING  . metFORMIN (GLUCOPHAGE) 1000 MG tablet TAKE 1 TABLET BY MOUTH TWO  TIMES DAILY WITH A MEAL  .  Multiple Vitamins-Minerals (WOMENS 50+ MULTI VITAMIN/MIN PO) Take by mouth.  . pregabalin (LYRICA) 50 MG capsule Take 1 capsule (50 mg total) by mouth 3 (three) times daily.  . traMADol (ULTRAM) 50 MG tablet Take 2 tablets (100 mg total) by mouth 2 (two) times daily.   No facility-administered encounter medications on file as of 10/26/2018.     Review of Systems:  Review of Systems  Constitutional: Negative for chills, fever and malaise/fatigue.  HENT: Negative for congestion, hearing loss and sore throat.   Eyes: Negative for blurred vision.  Respiratory: Negative for cough and  shortness of breath.   Cardiovascular: Negative for chest pain, palpitations, orthopnea, leg swelling and PND.  Gastrointestinal: Negative for abdominal pain, diarrhea, nausea and vomiting.  Genitourinary: Negative for dysuria, frequency and urgency.       No incontinence  Musculoskeletal: Positive for back pain, myalgias and neck pain. Negative for falls and joint pain.       Reports crawling on the floor early mornings due to severity of pain  Skin: Negative for itching and rash.  Neurological: Negative for dizziness and loss of consciousness.       Radicular pains into arms and down legs  Endo/Heme/Allergies: Does not bruise/bleed easily.  Psychiatric/Behavioral: Positive for depression. Negative for memory loss. The patient is nervous/anxious. The patient does not have insomnia.     Health Maintenance  Topic Date Due  . Hepatitis C Screening  11/01/1945  . OPHTHALMOLOGY EXAM  12/05/2013  . TETANUS/TDAP  04/06/2017  . INFLUENZA VACCINE  11/05/2018  . FOOT EXAM  12/23/2018  . HEMOGLOBIN A1C  02/16/2019  . COLONOSCOPY  09/30/2023  . DEXA SCAN  Completed  . PNA vac Low Risk Adult  Completed    Physical Exam: Vitals:   10/26/18 1140  BP: 120/80  Pulse: 99  Temp: 98.4 F (36.9 C)  TempSrc: Oral  SpO2: 98%  Weight: 131 lb (59.4 kg)  Height: 5\' 5"  (1.651 m)   Body mass index is 21.8 kg/m. Physical Exam Vitals signs reviewed.  Constitutional:      General: She is not in acute distress.    Appearance: Normal appearance. She is normal weight. She is not ill-appearing or toxic-appearing.  HENT:     Head: Normocephalic and atraumatic.     Right Ear: External ear normal.     Left Ear: External ear normal.  Eyes:     Extraocular Movements: Extraocular movements intact.     Pupils: Pupils are equal, round, and reactive to light.  Cardiovascular:     Rate and Rhythm: Normal rate and regular rhythm.     Pulses: Normal pulses.     Heart sounds: Normal heart sounds.   Pulmonary:     Effort: Pulmonary effort is normal.     Breath sounds: No wheezing, rhonchi or rales.  Abdominal:     General: Bowel sounds are normal.  Musculoskeletal: Normal range of motion.     Right lower leg: No edema.     Left lower leg: No edema.  Skin:    General: Skin is warm and dry.  Neurological:     General: No focal deficit present.     Mental Status: She is alert and oriented to person, place, and time.     Cranial Nerves: No cranial nerve deficit.     Gait: Gait normal.  Psychiatric:     Comments: Anxious today to the point of being tremulous in both hands     Labs reviewed:  Basic Metabolic Panel: Recent Labs    12/14/17 04/15/18 08/16/18  NA 140 138 143  K 4.4 4.7 4.9  CL  --   --  102  CO2  --   --  24  BUN 19 19 18   CREATININE 0.8 0.7 0.7  CALCIUM  --   --  9.1   Liver Function Tests: No results for input(s): AST, ALT, ALKPHOS, BILITOT, PROT, ALBUMIN in the last 8760 hours. No results for input(s): LIPASE, AMYLASE in the last 8760 hours. No results for input(s): AMMONIA in the last 8760 hours. CBC: Recent Labs    08/16/18  WBC 6.8  HGB 12.4  HCT 37  PLT 104*   Lipid Panel: Recent Labs    08/16/18  CHOL 133  HDL 55  LDLCALC 54  TRIG 120   Lab Results  Component Value Date   HGBA1C 7.7 08/16/2018    Procedures since last visit: EKG:  NSR at 96bpm (severe anxiety) with poor R wave progression, no acute ischemia or infarct  Assessment/Plan 1. Preoperative clearance - EKG 12-Lead as above - reports recent labs with endocrinologist who is not in any of the local health systems so I have no notes or information except pt report of improved hba1c -she has a low risk of cardiac complications from surgery:  3.9 % 30-day risk of death, MI, or cardiac arrest based on Revised Cardiac Risk Index calculation  2.  Low back pain:  Pt is for L4-S2 laminectomy with fusion with Dr. Sherlyn Lick on 8/3 -reviewed need to hold metformin and jardiance  morning of procedure due to no po intake and risk of volume depletion and renal failure from these meds in that context -may need her DMII managed with- insulin perioperatively -would prefer postop pain mgt to be managed by Dr. Sherlyn Lick as he should have a better idea of pain expectations for Marcie Bal postop -pt reports no therapy is recommended though facility staff were hoping pt could get some respite postop due to her caregiving role with her husband which puts her at risk for injury   Labs/tests ordered:  Did not duplicate labs in view of preop anesthesia visit planned  Next appt:  F/u postop either in health care or 1-2 weeks after surgery in [place] [facility]  Mabell Esguerra L. Yumi Insalaco, D.O. [facility] [address], [place] [address] Cell Phone (Mon-Fri 8am-5pm):  [phone] On Call:  [phone] & follow prompts after 5pm & weekends Office Phone:  [phone] Office Fax:  [phone]

## 2018-10-28 NOTE — Addendum Note (Signed)
Addended by: Gayland Curry on: 10/28/2018 03:47 PM   Modules accepted: Level of Service

## 2018-10-31 DIAGNOSIS — Z1159 Encounter for screening for other viral diseases: Secondary | ICD-10-CM | POA: Diagnosis not present

## 2018-10-31 DIAGNOSIS — Z01812 Encounter for preprocedural laboratory examination: Secondary | ICD-10-CM | POA: Diagnosis not present

## 2018-10-31 DIAGNOSIS — M4316 Spondylolisthesis, lumbar region: Secondary | ICD-10-CM | POA: Diagnosis not present

## 2018-11-01 DIAGNOSIS — M48062 Spinal stenosis, lumbar region with neurogenic claudication: Secondary | ICD-10-CM | POA: Diagnosis not present

## 2018-11-01 DIAGNOSIS — M4316 Spondylolisthesis, lumbar region: Secondary | ICD-10-CM | POA: Diagnosis not present

## 2018-11-01 DIAGNOSIS — M5432 Sciatica, left side: Secondary | ICD-10-CM | POA: Diagnosis not present

## 2018-11-01 DIAGNOSIS — M5431 Sciatica, right side: Secondary | ICD-10-CM | POA: Diagnosis not present

## 2018-11-02 DIAGNOSIS — M4317 Spondylolisthesis, lumbosacral region: Secondary | ICD-10-CM | POA: Diagnosis not present

## 2018-11-02 DIAGNOSIS — M5430 Sciatica, unspecified side: Secondary | ICD-10-CM | POA: Diagnosis not present

## 2018-11-02 DIAGNOSIS — M4807 Spinal stenosis, lumbosacral region: Secondary | ICD-10-CM | POA: Diagnosis not present

## 2018-11-02 DIAGNOSIS — Z01812 Encounter for preprocedural laboratory examination: Secondary | ICD-10-CM | POA: Diagnosis not present

## 2018-11-05 HISTORY — PX: BACK SURGERY: SHX140

## 2018-11-07 ENCOUNTER — Other Ambulatory Visit: Payer: Self-pay | Admitting: Internal Medicine

## 2018-11-07 DIAGNOSIS — Z981 Arthrodesis status: Secondary | ICD-10-CM | POA: Diagnosis not present

## 2018-11-07 DIAGNOSIS — M5431 Sciatica, right side: Secondary | ICD-10-CM | POA: Diagnosis not present

## 2018-11-07 DIAGNOSIS — M4316 Spondylolisthesis, lumbar region: Secondary | ICD-10-CM | POA: Diagnosis not present

## 2018-11-07 DIAGNOSIS — Z1159 Encounter for screening for other viral diseases: Secondary | ICD-10-CM | POA: Diagnosis not present

## 2018-11-07 DIAGNOSIS — I10 Essential (primary) hypertension: Secondary | ICD-10-CM

## 2018-11-07 DIAGNOSIS — M48061 Spinal stenosis, lumbar region without neurogenic claudication: Secondary | ICD-10-CM | POA: Diagnosis not present

## 2018-11-07 DIAGNOSIS — M810 Age-related osteoporosis without current pathological fracture: Secondary | ICD-10-CM | POA: Diagnosis not present

## 2018-11-07 DIAGNOSIS — M5432 Sciatica, left side: Secondary | ICD-10-CM | POA: Diagnosis not present

## 2018-11-07 DIAGNOSIS — M5126 Other intervertebral disc displacement, lumbar region: Secondary | ICD-10-CM | POA: Diagnosis not present

## 2018-11-07 DIAGNOSIS — M5430 Sciatica, unspecified side: Secondary | ICD-10-CM | POA: Diagnosis not present

## 2018-11-07 DIAGNOSIS — M4326 Fusion of spine, lumbar region: Secondary | ICD-10-CM | POA: Diagnosis not present

## 2018-11-07 DIAGNOSIS — E119 Type 2 diabetes mellitus without complications: Secondary | ICD-10-CM | POA: Diagnosis not present

## 2018-11-07 DIAGNOSIS — M48062 Spinal stenosis, lumbar region with neurogenic claudication: Secondary | ICD-10-CM | POA: Diagnosis not present

## 2018-11-09 ENCOUNTER — Other Ambulatory Visit: Payer: Self-pay | Admitting: Internal Medicine

## 2018-11-09 DIAGNOSIS — M5441 Lumbago with sciatica, right side: Secondary | ICD-10-CM

## 2018-11-09 DIAGNOSIS — I10 Essential (primary) hypertension: Secondary | ICD-10-CM

## 2018-11-09 DIAGNOSIS — M5442 Lumbago with sciatica, left side: Secondary | ICD-10-CM

## 2018-11-09 DIAGNOSIS — M5412 Radiculopathy, cervical region: Secondary | ICD-10-CM

## 2018-11-09 DIAGNOSIS — G8929 Other chronic pain: Secondary | ICD-10-CM

## 2018-11-09 MED ORDER — MORPHINE SULFATE 2 MG/ML IJ SOLN
2.00 | INTRAMUSCULAR | Status: DC
Start: ? — End: 2018-11-09

## 2018-11-09 MED ORDER — HYDROCODONE-ACETAMINOPHEN 5-325 MG PO TABS
1.00 | ORAL_TABLET | ORAL | Status: DC
Start: ? — End: 2018-11-09

## 2018-11-09 MED ORDER — DIPHENHYDRAMINE HCL 50 MG/ML IJ SOLN
25.00 | INTRAMUSCULAR | Status: DC
Start: ? — End: 2018-11-09

## 2018-11-09 MED ORDER — ATORVASTATIN CALCIUM 10 MG PO TABS
20.00 | ORAL_TABLET | ORAL | Status: DC
Start: 2018-11-09 — End: 2018-11-09

## 2018-11-09 MED ORDER — ONDANSETRON HCL 4 MG/2ML IJ SOLN
4.00 | INTRAMUSCULAR | Status: DC
Start: ? — End: 2018-11-09

## 2018-11-09 MED ORDER — CHOLECALCIFEROL 25 MCG (1000 UT) PO TABS
1000.00 | ORAL_TABLET | ORAL | Status: DC
Start: 2018-11-10 — End: 2018-11-09

## 2018-11-09 MED ORDER — GENERIC EXTERNAL MEDICATION
Status: DC
Start: ? — End: 2018-11-09

## 2018-11-09 MED ORDER — FLUOXETINE HCL 20 MG PO CAPS
40.00 | ORAL_CAPSULE | ORAL | Status: DC
Start: 2018-11-10 — End: 2018-11-09

## 2018-11-09 MED ORDER — KETOROLAC TROMETHAMINE 15 MG/ML IJ SOLN
15.00 | INTRAMUSCULAR | Status: DC
Start: ? — End: 2018-11-09

## 2018-11-09 MED ORDER — SENNOSIDES-DOCUSATE SODIUM 8.6-50 MG PO TABS
1.00 | ORAL_TABLET | ORAL | Status: DC
Start: 2018-11-09 — End: 2018-11-09

## 2018-11-09 MED ORDER — DIPHENHYDRAMINE HCL 25 MG PO CAPS
25.00 | ORAL_CAPSULE | ORAL | Status: DC
Start: ? — End: 2018-11-09

## 2018-11-09 MED ORDER — OXYCODONE-ACETAMINOPHEN 5-325 MG PO TABS: 1.0000 | ORAL_TABLET | ORAL | 0 refills | Status: DC | PRN

## 2018-11-09 MED ORDER — LOSARTAN POTASSIUM 50 MG PO TABS: 50.0000 mg | ORAL_TABLET | Freq: Every day | ORAL | 0 refills | Status: DC

## 2018-11-09 MED ORDER — PREGABALIN 50 MG PO CAPS
50.00 | ORAL_CAPSULE | ORAL | Status: DC
Start: ? — End: 2018-11-09

## 2018-11-09 MED ORDER — PHENOL 1.4 % MT LIQD
1.00 | OROMUCOSAL | Status: DC
Start: ? — End: 2018-11-09

## 2018-11-09 MED ORDER — METHOCARBAMOL 500 MG PO TABS
500.00 | ORAL_TABLET | ORAL | Status: DC
Start: 2018-11-09 — End: 2018-11-09

## 2018-11-09 MED ORDER — POTASSIUM CHLORIDE IN NACL 20-0.9 MEQ/L-% IV SOLN
100.00 | INTRAVENOUS | Status: DC
Start: ? — End: 2018-11-09

## 2018-11-09 MED ORDER — TRAMADOL HCL 50 MG PO TABS
100.00 | ORAL_TABLET | ORAL | Status: DC
Start: ? — End: 2018-11-09

## 2018-11-09 MED ORDER — ACETAMINOPHEN 325 MG PO TABS
650.00 | ORAL_TABLET | ORAL | Status: DC
Start: ? — End: 2018-11-09

## 2018-11-09 MED ORDER — OXYCODONE-ACETAMINOPHEN 5-325 MG PO TABS
1.00 | ORAL_TABLET | ORAL | Status: DC
Start: ? — End: 2018-11-09

## 2018-11-09 MED ORDER — ACETAMINOPHEN 500 MG PO TABS: 500.0000 mg | ORAL_TABLET | Freq: Four times a day (QID) | ORAL | 0 refills | Status: DC | PRN

## 2018-11-09 MED ORDER — ALPRAZOLAM 0.25 MG PO TABS
.25 | ORAL_TABLET | ORAL | Status: DC
Start: ? — End: 2018-11-09

## 2018-11-09 MED ORDER — ONDANSETRON 8 MG PO TBDP
8.00 | ORAL_TABLET | ORAL | Status: DC
Start: ? — End: 2018-11-09

## 2018-11-09 MED ORDER — LOSARTAN POTASSIUM 25 MG PO TABS
25.00 | ORAL_TABLET | ORAL | Status: DC
Start: 2018-11-09 — End: 2018-11-09

## 2018-11-09 MED ORDER — GENERIC EXTERNAL MEDICATION
2000.00 | Status: DC
Start: 2018-11-10 — End: 2018-11-09

## 2018-11-09 MED ORDER — CVS D3 50 MCG (2000 UT) PO CAPS: 2000.0000 [IU] | ORAL_CAPSULE | Freq: Every day | ORAL | 11 refills | Status: DC

## 2018-11-09 MED ORDER — NALOXONE HCL 0.4 MG/ML IJ SOLN
.10 | INTRAMUSCULAR | Status: DC
Start: ? — End: 2018-11-09

## 2018-11-09 MED ORDER — PANTOPRAZOLE SODIUM 40 MG PO TBEC
40.00 | DELAYED_RELEASE_TABLET | ORAL | Status: DC
Start: 2018-11-10 — End: 2018-11-09

## 2018-11-09 MED ORDER — DOCUSATE SODIUM 100 MG PO CAPS
100.00 | ORAL_CAPSULE | ORAL | Status: DC
Start: 2018-11-09 — End: 2018-11-09

## 2018-11-09 NOTE — Progress Notes (Signed)
Updated med list as per Dr. Sherrell Puller practice discharge summary from [facility] ([place]  Fixed losartan which should not be bid as she apparently took on her own.

## 2018-11-10 ENCOUNTER — Other Ambulatory Visit: Payer: Self-pay | Admitting: Adult Health

## 2018-11-10 DIAGNOSIS — R278 Other lack of coordination: Secondary | ICD-10-CM | POA: Diagnosis not present

## 2018-11-10 DIAGNOSIS — M5431 Sciatica, right side: Secondary | ICD-10-CM | POA: Diagnosis not present

## 2018-11-10 DIAGNOSIS — R2689 Other abnormalities of gait and mobility: Secondary | ICD-10-CM | POA: Diagnosis not present

## 2018-11-10 DIAGNOSIS — M48062 Spinal stenosis, lumbar region with neurogenic claudication: Secondary | ICD-10-CM | POA: Diagnosis not present

## 2018-11-10 MED ORDER — TRAMADOL HCL 50 MG PO TABS: 50.0000 mg | ORAL_TABLET | ORAL | 0 refills | Status: AC | PRN

## 2018-11-17 ENCOUNTER — Encounter: Payer: Self-pay | Admitting: Internal Medicine

## 2018-11-17 NOTE — Telephone Encounter (Signed)
Routed to Reed, Tiffany L, DO  

## 2018-11-18 DIAGNOSIS — M79605 Pain in left leg: Secondary | ICD-10-CM | POA: Diagnosis not present

## 2018-11-18 DIAGNOSIS — R000 Tachycardia, unspecified: Secondary | ICD-10-CM | POA: Diagnosis not present

## 2018-11-18 DIAGNOSIS — M79604 Pain in right leg: Secondary | ICD-10-CM | POA: Diagnosis not present

## 2018-11-18 DIAGNOSIS — G479 Sleep disorder, unspecified: Secondary | ICD-10-CM | POA: Diagnosis not present

## 2018-11-22 ENCOUNTER — Other Ambulatory Visit: Payer: Self-pay | Admitting: Internal Medicine

## 2018-11-22 DIAGNOSIS — Z1231 Encounter for screening mammogram for malignant neoplasm of breast: Secondary | ICD-10-CM

## 2018-11-25 DIAGNOSIS — E119 Type 2 diabetes mellitus without complications: Secondary | ICD-10-CM | POA: Diagnosis not present

## 2018-12-02 DIAGNOSIS — M4316 Spondylolisthesis, lumbar region: Secondary | ICD-10-CM | POA: Diagnosis not present

## 2018-12-02 DIAGNOSIS — Z4689 Encounter for fitting and adjustment of other specified devices: Secondary | ICD-10-CM | POA: Diagnosis not present

## 2018-12-02 DIAGNOSIS — M545 Low back pain: Secondary | ICD-10-CM | POA: Diagnosis not present

## 2018-12-02 DIAGNOSIS — M48062 Spinal stenosis, lumbar region with neurogenic claudication: Secondary | ICD-10-CM | POA: Diagnosis not present

## 2018-12-02 DIAGNOSIS — M5432 Sciatica, left side: Secondary | ICD-10-CM | POA: Diagnosis not present

## 2018-12-02 DIAGNOSIS — M5431 Sciatica, right side: Secondary | ICD-10-CM | POA: Diagnosis not present

## 2019-01-03 DIAGNOSIS — E119 Type 2 diabetes mellitus without complications: Secondary | ICD-10-CM | POA: Diagnosis not present

## 2019-01-03 DIAGNOSIS — I10 Essential (primary) hypertension: Secondary | ICD-10-CM | POA: Diagnosis not present

## 2019-01-03 DIAGNOSIS — M545 Low back pain: Secondary | ICD-10-CM | POA: Diagnosis not present

## 2019-01-03 DIAGNOSIS — E782 Mixed hyperlipidemia: Secondary | ICD-10-CM | POA: Diagnosis not present

## 2019-01-09 ENCOUNTER — Other Ambulatory Visit: Payer: Self-pay | Admitting: *Deleted

## 2019-01-09 ENCOUNTER — Encounter: Payer: Self-pay | Admitting: Internal Medicine

## 2019-01-09 MED ORDER — ESOMEPRAZOLE MAGNESIUM 20 MG PO CPDR: 20.0000 mg | DELAYED_RELEASE_CAPSULE | Freq: Every day | ORAL | 0 refills | Status: DC

## 2019-01-09 NOTE — Telephone Encounter (Signed)
Patient and [facility] called requesting refill on Nexium. Patient stated that she is out. Faxed to pharmacy.

## 2019-01-18 ENCOUNTER — Encounter: Payer: Self-pay | Admitting: Internal Medicine

## 2019-01-18 NOTE — Telephone Encounter (Signed)
Message routed to Reed, Tiffany L, DO  

## 2019-01-19 NOTE — Telephone Encounter (Signed)
Left message on voicemail for patient to return call when available   Reason for call: Offer appointment with Dr.Reed

## 2019-01-23 ENCOUNTER — Other Ambulatory Visit: Payer: Self-pay

## 2019-01-23 ENCOUNTER — Ambulatory Visit
Admission: RE | Admit: 2019-01-23 | Discharge: 2019-01-23 | Disposition: A | Discharge status: Home / Self Care | Payer: Medicare Other | Source: Ambulatory Visit | Attending: Internal Medicine | Admitting: Internal Medicine

## 2019-01-23 DIAGNOSIS — Z1231 Encounter for screening mammogram for malignant neoplasm of breast: Secondary | ICD-10-CM

## 2019-01-27 ENCOUNTER — Encounter: Payer: Medicare Other | Admitting: Family

## 2019-01-27 ENCOUNTER — Other Ambulatory Visit: Payer: Self-pay

## 2019-01-27 DIAGNOSIS — M545 Low back pain: Secondary | ICD-10-CM | POA: Diagnosis not present

## 2019-01-30 ENCOUNTER — Encounter: Payer: Self-pay | Admitting: Internal Medicine

## 2019-01-30 MED ORDER — ALPRAZOLAM 0.25 MG PO TABS: 0.1250 mg | ORAL_TABLET | Freq: Every day | ORAL | 0 refills | Status: DC | PRN

## 2019-01-30 NOTE — Telephone Encounter (Signed)
Verified information in [place] database. Patient last refill 06/22/2018. Last appointment was 10/26/2018.

## 2019-01-30 NOTE — Progress Notes (Signed)
This encounter was created in error - please disregard.

## 2019-01-31 DIAGNOSIS — M4316 Spondylolisthesis, lumbar region: Secondary | ICD-10-CM | POA: Diagnosis not present

## 2019-01-31 DIAGNOSIS — M4326 Fusion of spine, lumbar region: Secondary | ICD-10-CM | POA: Diagnosis not present

## 2019-01-31 DIAGNOSIS — M48062 Spinal stenosis, lumbar region with neurogenic claudication: Secondary | ICD-10-CM | POA: Diagnosis not present

## 2019-04-11 DIAGNOSIS — I10 Essential (primary) hypertension: Secondary | ICD-10-CM | POA: Diagnosis not present

## 2019-04-11 DIAGNOSIS — E782 Mixed hyperlipidemia: Secondary | ICD-10-CM | POA: Diagnosis not present

## 2019-04-11 DIAGNOSIS — E119 Type 2 diabetes mellitus without complications: Secondary | ICD-10-CM | POA: Diagnosis not present

## 2019-04-11 DIAGNOSIS — R000 Tachycardia, unspecified: Secondary | ICD-10-CM | POA: Diagnosis not present

## 2019-04-21 DIAGNOSIS — M5431 Sciatica, right side: Secondary | ICD-10-CM | POA: Diagnosis not present

## 2019-04-21 DIAGNOSIS — M48062 Spinal stenosis, lumbar region with neurogenic claudication: Secondary | ICD-10-CM | POA: Diagnosis not present

## 2019-04-21 DIAGNOSIS — M4326 Fusion of spine, lumbar region: Secondary | ICD-10-CM | POA: Diagnosis not present

## 2019-04-21 DIAGNOSIS — M5432 Sciatica, left side: Secondary | ICD-10-CM | POA: Diagnosis not present

## 2019-05-31 ENCOUNTER — Non-Acute Institutional Stay: Payer: Medicare Other | Admitting: Internal Medicine

## 2019-05-31 ENCOUNTER — Encounter: Payer: Self-pay | Admitting: Internal Medicine

## 2019-05-31 ENCOUNTER — Other Ambulatory Visit: Payer: Self-pay

## 2019-05-31 VITALS — BP 122/72 | HR 83 | Temp 98.1°F | Wt 130.0 lb

## 2019-05-31 DIAGNOSIS — M8949 Other hypertrophic osteoarthropathy, multiple sites: Secondary | ICD-10-CM

## 2019-05-31 DIAGNOSIS — R457 State of emotional shock and stress, unspecified: Secondary | ICD-10-CM

## 2019-05-31 DIAGNOSIS — E1169 Type 2 diabetes mellitus with other specified complication: Secondary | ICD-10-CM | POA: Diagnosis not present

## 2019-05-31 DIAGNOSIS — M79661 Pain in right lower leg: Secondary | ICD-10-CM | POA: Diagnosis not present

## 2019-05-31 DIAGNOSIS — E785 Hyperlipidemia, unspecified: Secondary | ICD-10-CM

## 2019-05-31 DIAGNOSIS — Z1159 Encounter for screening for other viral diseases: Secondary | ICD-10-CM

## 2019-05-31 DIAGNOSIS — M159 Polyosteoarthritis, unspecified: Secondary | ICD-10-CM

## 2019-05-31 NOTE — Progress Notes (Signed)
Location:  Occupational psychologist of Service:  Clinic (12)  Provider: Anishka Bushard L. Mariea Clonts, D.O., C.M.D.  Goals of Care:  Advanced Directives 05/31/2019  Does Patient Have a Medical Advance Directive? Yes  Type of Advance Directive -  Does patient want to make changes to medical advance directive? No - Patient declined  Copy of [place] in Chart? -   Chief Complaint  Patient presents with  . Medical Management of Chronic Issues    Follow up  on back sugery     HPI: Patient is a 74 y.o. female seen today for medical management of chronic diseases--she's finally following up with me after her back surgery.  Surgery went well.  Lower back only bothers her if she's been on her legs all day.  Uses tylenol for that.  She had an acute episode last week where she heard a pop in her leg.  She was amid vacuuming and turned her leg, heard a crack, and fell to the floor.  She got up and walked.  Sore in her right calf.  Then walking different so knee hurting now.  No swelling.  She can wiggle her toes.  It didn't turn colors.  Taking a couple motrin and a couple of tylenol.      Says she has been stressed.  Every time Timmothy Sours falls, she falls apart.  He's been weak lately and without energy.  Each UTI makes him worse.  [place]  She is crying at night.  She thinks she needs to talk to somebody again.  She has lost weight, but not on purpose due to stress.  Her kids have been very supportive.  His hypophonia is getting worse.    Reminded her about the support group for PD.  It's been zoom and she does not feel like she gets anything out of that.  She'd been going to [employer] for the support group pre-covid.  Last hba1c was 7.2.  She's still on metformin and jardiance.  No yeast infections.  She's had a couple times when she's had low sugar if waits too long b/w breakfast and lunch.  Does keep the glucose tabs in her purse--after  10 mins she starts feeling normal again.  She does chair-1 with Shirlean Mylar here and walks the dog 1/2 to 3/4 mile daily even with her sore leg.  Sits when she gets back.  Puts ice and heat on it.  Also using tylenol or aleve.  She says she did see endocrinology this year.  Last visit was 9/29.    Past Medical History:  Diagnosis Date  . Allergic rhinitis   . Anxiety   . Atrophic vaginitis 2017  . Depression   . GERD (gastroesophageal reflux disease)   . Hematuria 05/09/2015   had two nonobstructing renal calculi  . Hepatitis A 1970s  . Hyperlipidemia   . Osteopenia   . Shingles 06/04/2015  . SUI (stress urinary incontinence, female) 2017  . Thrombocytopenia ([place])     Past Surgical History:  Procedure Laterality Date  . CATARACT EXTRACTION W/ INTRAOCULAR LENS  IMPLANT, BILATERAL Bilateral   . KNEE ARTHROSCOPY  2004, 1990s  . TONSILLECTOMY AND ADENOIDECTOMY  1952    Allergies  Allergen Reactions  . Keflex [Cephalexin]   . Sulfa Antibiotics     Outpatient Encounter Medications as of 05/31/2019  Medication Sig  . acetaminophen (TYLENOL) 500 MG tablet Take 1 tablet (500  mg total) by mouth every 6 (six) hours as needed.  . ALPRAZolam (XANAX) 0.25 MG tablet Take 0.5 tablets (0.125 mg total) by mouth daily as needed for anxiety.  Marland Kitchen atorvastatin (LIPITOR) 20 MG tablet TAKE 1 TABLET BY MOUTH  DAILY  . esomeprazole (NEXIUM) 20 MG capsule Take 1 capsule (20 mg total) by mouth daily at 12 noon.  Marland Kitchen FLUoxetine (PROZAC) 40 MG capsule TAKE 1 CAPSULE BY MOUTH  DAILY  . JARDIANCE 10 MG TABS tablet TAKE 1 TABLET BY MOUTH ONCE DAILY  . ketoconazole (NIZORAL) 2 % cream APPLY 1 APPLICATION TOPICALLY 2 (TWO) TIMES DAILY AS NEEDED (GROIN RASH).  Marland Kitchen losartan (COZAAR) 50 MG tablet Take 1 tablet (50 mg total) by mouth daily.  . metFORMIN (GLUCOPHAGE) 1000 MG tablet TAKE 1 TABLET BY MOUTH TWO  TIMES DAILY WITH A MEAL  . Multiple Vitamins-Minerals (WOMENS 50+ MULTI VITAMIN/MIN PO) Take by mouth.  .  [DISCONTINUED] Cholecalciferol ([facility] D3) 50 MCG (2000 UT) CAPS Take 1 capsule (2,000 Units total) by mouth daily.  . [DISCONTINUED] pregabalin (LYRICA) 50 MG capsule Take 1 capsule (50 mg total) by mouth 3 (three) times daily.  . [DISCONTINUED] traMADol (ULTRAM) 50 MG tablet Take 2 tablets (100 mg total) by mouth 2 (two) times daily.   No facility-administered encounter medications on file as of 05/31/2019.    Review of Systems:  Review of Systems  Constitutional: Positive for weight loss. Negative for chills and fever.  HENT: Negative for congestion and sore throat.   Eyes: Negative for blurred vision.  Respiratory: Negative for cough and shortness of breath.   Cardiovascular: Negative for chest pain, palpitations and leg swelling.  Gastrointestinal: Negative for abdominal pain and constipation.  Genitourinary: Negative for dysuria.  Musculoskeletal: Positive for falls and joint pain. Negative for back pain and neck pain.  Skin: Negative for rash.  Neurological: Negative for dizziness and loss of consciousness.  Endo/Heme/Allergies: Bruises/bleeds easily.  Psychiatric/Behavioral: Positive for depression. Negative for memory loss. The patient is nervous/anxious.     Health Maintenance  Topic Date Due  . Hepatitis C Screening  1946-03-26  . OPHTHALMOLOGY EXAM  12/05/2013  . TETANUS/TDAP  04/06/2017  . FOOT EXAM  12/23/2018  . HEMOGLOBIN A1C  02/16/2019  . COLONOSCOPY  09/30/2023  . INFLUENZA VACCINE  Completed  . DEXA SCAN  Completed  . PNA vac Low Risk Adult  Completed    Physical Exam: Vitals:   05/31/19 1012  BP: 122/72  Pulse: 83  Temp: 98.1 F (36.7 C)  SpO2: 97%  Weight: 130 lb (59 kg)   Body mass index is 21.63 kg/m. Physical Exam Vitals reviewed.  Constitutional:      General: She is not in acute distress.    Appearance: Normal appearance. She is not ill-appearing or toxic-appearing.  HENT:     Head: Normocephalic and atraumatic.  Cardiovascular:     Rate  and Rhythm: Normal rate and regular rhythm.     Pulses: Normal pulses.     Heart sounds: Normal heart sounds.  Pulmonary:     Effort: Pulmonary effort is normal.     Breath sounds: No wheezing, rhonchi or rales.  Abdominal:     General: Bowel sounds are normal.     Palpations: Abdomen is soft.     Tenderness: There is no abdominal tenderness.  Musculoskeletal:     Right lower leg: No edema.     Left lower leg: No edema.     Comments: Right lower calf tenderness,  no swelling, discoloration or deformity visible  Skin:    General: Skin is warm and dry.  Neurological:     General: No focal deficit present.     Mental Status: She is alert and oriented to person, place, and time.  Psychiatric:        Mood and Affect: Mood normal.     Labs reviewed: Basic Metabolic Panel: Recent Labs    08/16/18 0000  NA 143  K 4.9  CL 102  CO2 24  BUN 18  CREATININE 0.7  CALCIUM 9.1   Liver Function Tests: No results for input(s): AST, ALT, ALKPHOS, BILITOT, PROT, ALBUMIN in the last 8760 hours. No results for input(s): LIPASE, AMYLASE in the last 8760 hours. No results for input(s): AMMONIA in the last 8760 hours. CBC: Recent Labs    08/16/18 0000  WBC 6.8  HGB 12.4  HCT 37  PLT 104*   Lipid Panel: Recent Labs    08/16/18 0000  CHOL 133  HDL 55  LDLCALC 54  TRIG 120   Lab Results  Component Value Date   HGBA1C 7.7 08/16/2018     Assessment/Plan 1. Type 2 diabetes mellitus with other specified complication, without long-term current use of insulin (HCC) -cont current regimen per endocrine (on metformin and jardiance), exercise and healthy diet Lab Results  Component Value Date   HGBA1C 7.7 08/16/2018    2. Hyperlipidemia associated with type 2 diabetes mellitus ([place]) - Lab Results  Component Value Date   LDLCALC 54 08/16/2018   cont lipitor therapy, diet and exercise  3. Caregiver stress syndrome -suggested she find an online support group or folks at  [facility] with spouses with PD due to her concerns about Don's decline   4. Primary osteoarthritis involving multiple joints -continue tylenol when needed for pain, try to keep moving to keep joints lubricated  5. Right calf pain -s/p pop sound with fall while vacuuming--tender in calf, but no visible changes and is back to being able to bear weight, cont elevation, ice ,compression, but etiology not clear (at first I thought she'd ruptured a tendon when she could not initially bear weight but that resolved)  6. Encounter for hepatitis C screening test for low risk patient -check hep c screen with next labs  Labs/tests ordered:  Cbc, bmp, hep c screen, flp Next appt:  4 mos with foot exam  Khamari Sheehan L. Mauria Asquith, D.O. [facility] [address], [place] [address] Cell Phone (Mon-Fri 8am-5pm):  [phone] On Call:  [phone] & follow prompts after 5pm & weekends Office Phone:  [phone] Office Fax:  [phone]

## 2019-06-15 IMAGING — CR DG CERVICAL SPINE COMPLETE 4+V
5 series · 5 of 5 positions shown · non-contrast
Comparison: None.

CLINICAL DATA: Chronic neck pain and bilateral cervical
radiculopathy without known injury.

EXAM:
CERVICAL SPINE - COMPLETE 4+ VIEW

[w c-spine lat]
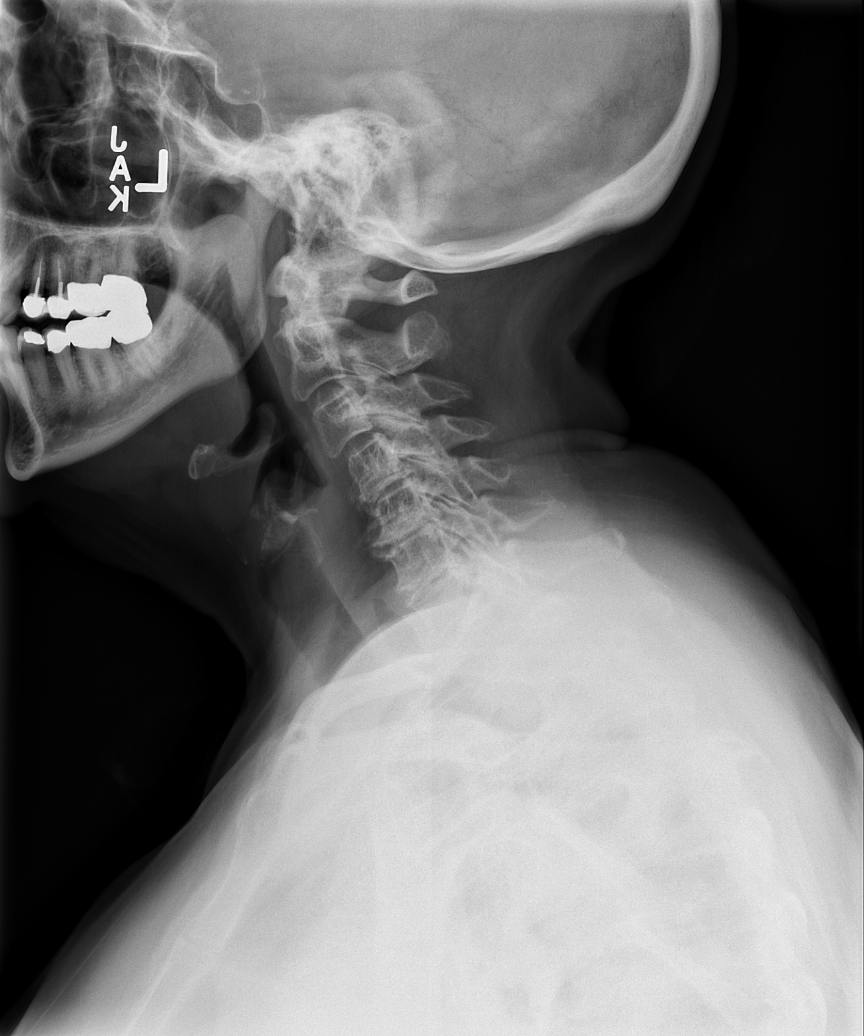

[w c-spine oblique (1 of 2)]
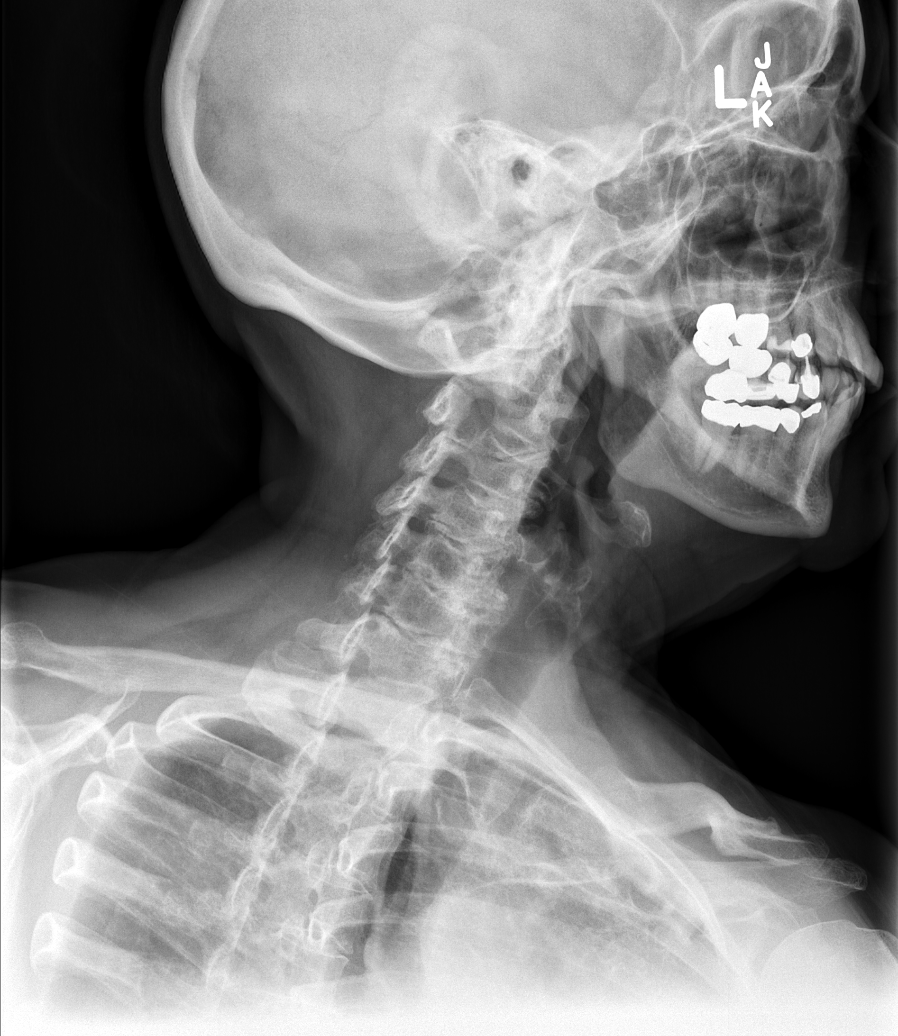

[w c-spine oblique (2 of 2)]
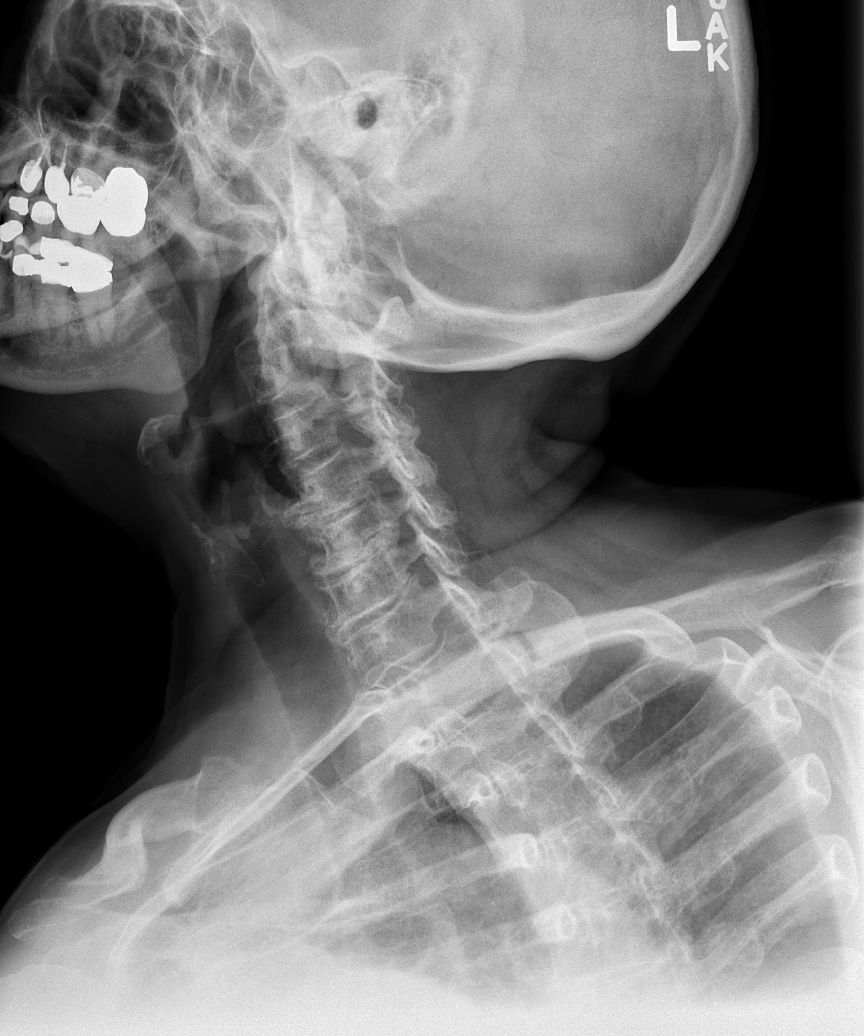

[w c-spine a.p. *]
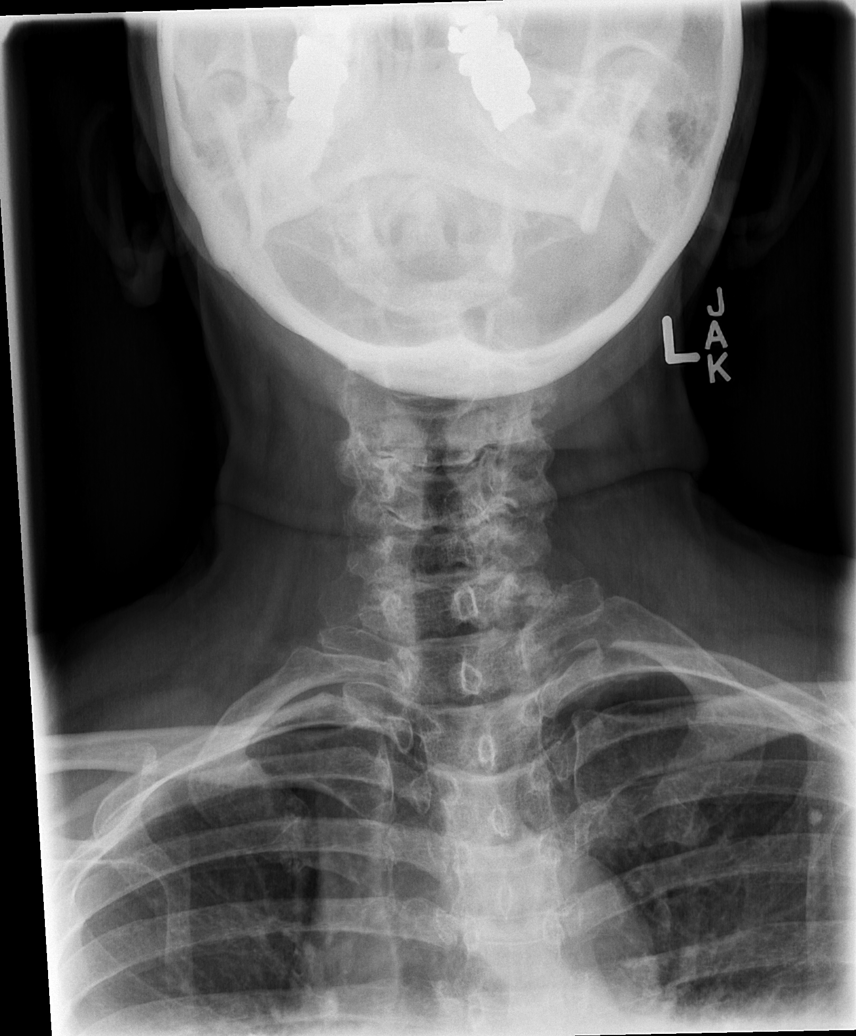

[w c-spine odontoid *]
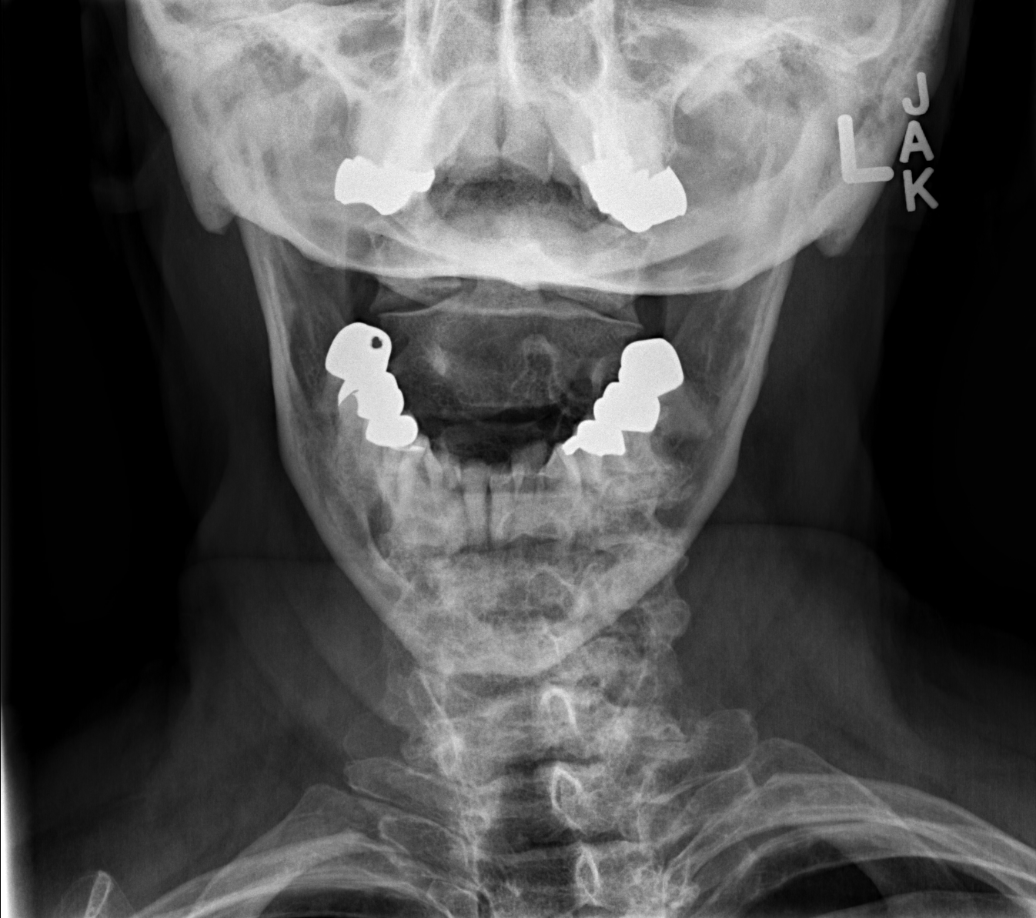

[5 of 5 positions shown; findings below may reference images not displayed]

FINDINGS: No fracture or significant spondylolisthesis is noted. No
prevertebral soft tissue swelling is noted. Severe degenerative disc
disease is noted at C4-5, C5-6 and C6-7 with anterior osteophyte
formation. Mild bilateral neural foraminal stenosis is noted at
C4-5, C5-6 and C6-7 secondary to uncovertebral spurring.
IMPRESSION: Severe multilevel degenerative disc disease is noted with bilateral
neural foraminal stenosis secondary to uncovertebral spurring as
described above. No acute abnormality seen in the cervical spine.

## 2019-06-29 ENCOUNTER — Encounter: Payer: Self-pay | Admitting: Internal Medicine

## 2019-07-11 DIAGNOSIS — I10 Essential (primary) hypertension: Secondary | ICD-10-CM | POA: Diagnosis not present

## 2019-07-11 DIAGNOSIS — E782 Mixed hyperlipidemia: Secondary | ICD-10-CM | POA: Diagnosis not present

## 2019-07-11 DIAGNOSIS — R000 Tachycardia, unspecified: Secondary | ICD-10-CM | POA: Diagnosis not present

## 2019-07-11 DIAGNOSIS — E119 Type 2 diabetes mellitus without complications: Secondary | ICD-10-CM | POA: Diagnosis not present

## 2019-07-11 LAB — HEMOGLOBIN A1C: Hemoglobin A1C: 7.7

## 2019-07-24 DIAGNOSIS — M85852 Other specified disorders of bone density and structure, left thigh: Secondary | ICD-10-CM | POA: Diagnosis not present

## 2019-07-24 DIAGNOSIS — M810 Age-related osteoporosis without current pathological fracture: Secondary | ICD-10-CM | POA: Diagnosis not present

## 2019-07-24 DIAGNOSIS — Z13820 Encounter for screening for osteoporosis: Secondary | ICD-10-CM | POA: Diagnosis not present

## 2019-07-24 DIAGNOSIS — Z780 Asymptomatic menopausal state: Secondary | ICD-10-CM | POA: Diagnosis not present

## 2019-07-24 LAB — HM DEXA SCAN: HM Dexa Scan: -1.7

## 2019-08-04 ENCOUNTER — Other Ambulatory Visit: Payer: Self-pay | Admitting: Internal Medicine

## 2019-08-11 ENCOUNTER — Telehealth: Payer: Self-pay | Admitting: *Deleted

## 2019-08-11 NOTE — Telephone Encounter (Signed)
Patient called and stated that she had a Bone Density done over a month ago and she hasn't heard the results. Stated that she had done in [facility] and was wondering if you have received them. Please Advise.

## 2019-08-11 NOTE — Telephone Encounter (Signed)
I have not seen it which explains why she has not heard anything.  If she can tell us where in [facility], we can request the bone density report.

## 2019-08-14 ENCOUNTER — Encounter: Payer: Self-pay | Admitting: Internal Medicine

## 2019-08-14 NOTE — Telephone Encounter (Signed)
Patient responded to message through myChart.

## 2019-08-14 NOTE — Telephone Encounter (Signed)
Tried calling patient and Voicemail is Full and cannot leave message. Will try again later.

## 2019-08-15 ENCOUNTER — Encounter: Payer: Self-pay | Admitting: Internal Medicine

## 2019-08-25 ENCOUNTER — Encounter: Payer: Self-pay | Admitting: Internal Medicine

## 2019-08-25 MED ORDER — ALPRAZOLAM 0.25 MG PO TABS: 0.1250 mg | ORAL_TABLET | Freq: Every day | ORAL | 0 refills | Status: DC | PRN

## 2019-08-30 ENCOUNTER — Non-Acute Institutional Stay: Payer: Medicare Other | Admitting: Internal Medicine

## 2019-08-30 ENCOUNTER — Other Ambulatory Visit: Payer: Self-pay

## 2019-08-30 ENCOUNTER — Encounter: Payer: Self-pay | Admitting: Internal Medicine

## 2019-08-30 VITALS — BP 142/92 | HR 93 | Temp 97.5°F | Ht 65.0 in | Wt 128.0 lb

## 2019-08-30 DIAGNOSIS — G5621 Lesion of ulnar nerve, right upper limb: Secondary | ICD-10-CM | POA: Diagnosis not present

## 2019-08-30 DIAGNOSIS — E1169 Type 2 diabetes mellitus with other specified complication: Secondary | ICD-10-CM

## 2019-08-30 DIAGNOSIS — R457 State of emotional shock and stress, unspecified: Secondary | ICD-10-CM | POA: Diagnosis not present

## 2019-08-30 DIAGNOSIS — M150 Primary generalized (osteo)arthritis: Secondary | ICD-10-CM

## 2019-08-30 DIAGNOSIS — Z800 Family history of malignant neoplasm of digestive organs: Secondary | ICD-10-CM

## 2019-08-30 DIAGNOSIS — M7711 Lateral epicondylitis, right elbow: Secondary | ICD-10-CM

## 2019-08-30 DIAGNOSIS — F4389 Other reactions to severe stress: Secondary | ICD-10-CM

## 2019-08-30 DIAGNOSIS — M159 Polyosteoarthritis, unspecified: Secondary | ICD-10-CM

## 2019-08-30 DIAGNOSIS — M8949 Other hypertrophic osteoarthropathy, multiple sites: Secondary | ICD-10-CM

## 2019-08-30 DIAGNOSIS — E785 Hyperlipidemia, unspecified: Secondary | ICD-10-CM

## 2019-08-30 MED ORDER — FLUOXETINE HCL 20 MG PO CAPS: 20.0000 mg | ORAL_CAPSULE | Freq: Every day | ORAL | 0 refills | Status: DC

## 2019-08-30 MED ORDER — BUPROPION HCL ER (XL) 150 MG PO TB24: 150.0000 mg | ORAL_TABLET | Freq: Every day | ORAL | 1 refills | Status: DC

## 2019-08-30 MED ORDER — FLUOXETINE HCL 10 MG PO CAPS: 10.0000 mg | ORAL_CAPSULE | Freq: Every day | ORAL | 0 refills | Status: DC

## 2019-08-30 MED ORDER — GABAPENTIN 100 MG PO CAPS: 100.0000 mg | ORAL_CAPSULE | Freq: Three times a day (TID) | ORAL | 1 refills | Status: DC

## 2019-08-30 NOTE — Progress Notes (Signed)
Location:  [facility] of Service:  Clinic (12)  Provider: Dustine Stickler L. Mariea Clonts, D.O., C.M.D.  Goals of Care:  Advanced Directives 08/30/2019  Does Patient Have a Medical Advance Directive? Yes  Type of Paramedic of [place];Out of facility DNR (pink MOST or yellow form);Living will  Does patient want to make changes to medical advance directive? No - Patient declined  Copy of [place] in Chart? Yes - validated most recent copy scanned in chart (See row information)  Pre-existing out of facility DNR order (yellow form or pink MOST form) Pink MOST/Yellow Form most recent copy in chart - Physician notified to receive inpatient order     Chief Complaint  Patient presents with  . Medical Management of Chronic Issues    4 month follow up    HPI: Patient is a 74 y.o. female seen today for medical management of chronic diseases.    Right elbow is injured.  Thinks from too much ipad use.  Feels like it felt when she had tennis elbow in her left elbow. Little finger is numb and hand are tingling.  Has been using motrin too much and wants to get away from it.  Can lift her arm again.  Is using a brace.  Has been playing the piano here some.   Fluoxetine has been her antidepressant for 20 years.  She thinks she needs something different.  She does not want to gain weight back with something.  She's tired of being in a hole.    They just moved to the big house in Jan.  He falls daily.  She feels like she has to badger him to use his walker.  Today he could not feed himself.   He gets going too fast.  She is thinking he needs someone twice a day to help him get bathed in the morning and ready for bed at night.  The boys are also really worried about him.   He had several tests with Dr. Carles Collet at her office.     hba1c was 7.7.    She had a few teeth removed and needs a bridge on her right upper jaw.    Bone density was stable.      Due for cscope--has been going every 5 years b/c her brother had colon ca.  Used prepopik prep last time and she tolerated that better (otherwise gets to vomiting).    The popping she had in her lower leg last visit when vacuuming later moved into her right knee.  It's now gone away.  She's able to walk without pain.  Knows both knees are bad.    Had her mammogram.    Has had eye exam[facility] [address]-Dr. Mintzi--had no retinopathy.  Still sees endocrine and she says hba1c was 7.1 with her (we have 7.7).  She eats a healthy diet.    Needs her tdap.  Told to get at [facility].    Past Medical History:  Diagnosis Date  . Allergic rhinitis   . Anxiety   . Atrophic vaginitis 2017  . Depression   . GERD (gastroesophageal reflux disease)   . Hematuria 05/09/2015   had two nonobstructing renal calculi  . Hepatitis A 1970s  . Hyperlipidemia   . Osteopenia   . Shingles 06/04/2015  . SUI (stress urinary incontinence, female) 2017  . Thrombocytopenia ([place])     Past Surgical History:  Procedure Laterality Date  . CATARACT  EXTRACTION W/ INTRAOCULAR LENS  IMPLANT, BILATERAL Bilateral   . KNEE ARTHROSCOPY  2004, 1990s  . TONSILLECTOMY AND ADENOIDECTOMY  1952    Allergies  Allergen Reactions  . Keflex [Cephalexin]   . Sulfa Antibiotics     Outpatient Encounter Medications as of 08/30/2019  Medication Sig  . acetaminophen (TYLENOL) 500 MG tablet Take 1 tablet (500 mg total) by mouth every 6 (six) hours as needed.  . ALPRAZolam (XANAX) 0.25 MG tablet Take 0.5 tablets (0.125 mg total) by mouth daily as needed for anxiety.  Marland Kitchen atorvastatin (LIPITOR) 20 MG tablet TAKE 1 TABLET BY MOUTH  DAILY  . esomeprazole (NEXIUM) 20 MG capsule Take 1 capsule (20 mg total) by mouth daily at 12 noon.  Marland Kitchen FLUoxetine (PROZAC) 40 MG capsule TAKE 1 CAPSULE BY MOUTH  DAILY  . JARDIANCE 10 MG TABS tablet TAKE 1 TABLET BY MOUTH ONCE DAILY  . ketoconazole (NIZORAL) 2 % cream APPLY 1 APPLICATION TOPICALLY  2 (TWO) TIMES DAILY AS NEEDED (GROIN RASH).  Marland Kitchen losartan (COZAAR) 100 MG tablet Take 100 mg by mouth daily.  . metFORMIN (GLUCOPHAGE) 1000 MG tablet TAKE 1 TABLET BY MOUTH TWO  TIMES DAILY WITH A MEAL  . Multiple Vitamins-Minerals (WOMENS 50+ MULTI VITAMIN/MIN PO) Take by mouth.  . [DISCONTINUED] losartan (COZAAR) 50 MG tablet Take 1 tablet (50 mg total) by mouth daily.   No facility-administered encounter medications on file as of 08/30/2019.    Review of Systems:  Review of Systems  Constitutional: Positive for malaise/fatigue and weight loss. Negative for chills and fever.  Eyes: Negative for blurred vision.  Respiratory: Negative for cough and shortness of breath.   Cardiovascular: Negative for chest pain, palpitations and leg swelling.  Gastrointestinal: Negative for abdominal pain, blood in stool, constipation, diarrhea and melena.  Genitourinary: Negative for dysuria.  Musculoskeletal: Positive for joint pain. Negative for back pain, falls, myalgias and neck pain.  Neurological: Positive for tingling and sensory change. Negative for dizziness and loss of consciousness.  Endo/Heme/Allergies: Does not bruise/bleed easily.  Psychiatric/Behavioral: Positive for depression. Negative for memory loss. The patient is nervous/anxious. The patient does not have insomnia.     Health Maintenance  Topic Date Due  . Hepatitis C Screening  Never done  . TETANUS/TDAP  04/06/2017  . HEMOGLOBIN A1C  09/10/2019 (Originally 02/16/2019)  . INFLUENZA VACCINE  11/05/2019  . FOOT EXAM  04/16/2020  . OPHTHALMOLOGY EXAM  07/04/2020  . COLONOSCOPY  09/30/2023  . DEXA SCAN  Completed  . COVID-19 Vaccine  Completed  . PNA vac Low Risk Adult  Completed    Physical Exam: Vitals:   08/30/19 1213  BP: (!) 142/92  Pulse: 93  Temp: (!) 97.5 F (36.4 C)  SpO2: 94%  Weight: 128 lb (58.1 kg)  Height: 5\' 5"  (1.651 m)   Body mass index is 21.3 kg/m. Physical Exam Vitals reviewed.  Constitutional:       General: She is not in acute distress.    Appearance: Normal appearance. She is not ill-appearing or toxic-appearing.  HENT:     Head: Normocephalic and atraumatic.  Cardiovascular:     Rate and Rhythm: Normal rate and regular rhythm.     Pulses: Normal pulses.     Heart sounds: Normal heart sounds.  Pulmonary:     Effort: Pulmonary effort is normal.     Breath sounds: Normal breath sounds. No rales.  Abdominal:     General: Bowel sounds are normal.  Musculoskeletal:  General: Normal range of motion.     Right lower leg: No edema.     Left lower leg: No edema.     Comments: Wearing brace on right elbow, pain in triceps area   Skin:    General: Skin is warm and dry.  Neurological:     General: No focal deficit present.     Mental Status: She is alert and oriented to person, place, and time.     Sensory: Sensory deficit present.     Motor: No weakness.     Gait: Gait normal.     Comments: Loss of sensation in right 5th digit  Psychiatric:     Comments: Near tears--eyes welling up talking about her husband's decline     Labs reviewed: Basic Metabolic Panel: No results for input(s): NA, K, CL, CO2, GLUCOSE, BUN, CREATININE, CALCIUM, MG, PHOS, TSH in the last 8760 hours. Liver Function Tests: No results for input(s): AST, ALT, ALKPHOS, BILITOT, PROT, ALBUMIN in the last 8760 hours. No results for input(s): LIPASE, AMYLASE in the last 8760 hours. No results for input(s): AMMONIA in the last 8760 hours. CBC: No results for input(s): WBC, NEUTROABS, HGB, HCT, MCV, PLT in the last 8760 hours. Lipid Panel: No results for input(s): CHOL, HDL, LDLCALC, TRIG, CHOLHDL, LDLDIRECT in the last 8760 hours. Lab Results  Component Value Date   HGBA1C 7.7 08/16/2018    Procedures since last visit: No results found.  Assessment/Plan 1. Ulnar neuropathy at elbow of right upper extremity -Start gabapentin back but for ulnar neuropathy - gabapentin (NEURONTIN) 100 MG capsule;  Take 1 capsule (100 mg total) by mouth 3 (three) times daily.  Dispense: 90 capsule; Refill: 1  2. Lateral epicondylitis of right elbow -cont voltaren gel and brace -if pain persists, may need sports med or ortho visit  3. Family history of colon cancer requiring screening colonoscopy - due for cscope due to brother with colon ca--see above as requests special prep - Ambulatory referral to Gastroenterology  4. Caregiver stress syndrome - depression symptoms worse due to stress and sadness about her husband's progressing Parkinson's and decreasing functional and cognitive status -she wants to get caregivers twice a day, but knows it's going to be a big argument for him to accept each step of the way -I did support her with this, but she wants to try to discuss with him first -Wean fluoxetine and start wellbutrin - buPROPion (WELLBUTRIN XL) 150 MG 24 hr tablet; Take 1 tablet (150 mg total) by mouth daily.  Dispense: 30 tablet; Refill: 1 - FLUoxetine (PROZAC) 20 MG capsule; Take 1 capsule (20 mg total) by mouth daily.  Dispense: 7 capsule; Refill: 0 - FLUoxetine (PROZAC) 10 MG capsule; Take 1 capsule (10 mg total) by mouth daily.  Dispense: 7 capsule; Refill: 0  5. Type 2 diabetes mellitus with other specified complication, without long-term current use of insulin (HCC) Lab Results  Component Value Date   HGBA1C 7.7 08/16/2018  has been well controlled, is eating healthier and has lost weight, still exercising also Says she has another lab next week?  6. Hyperlipidemia associated with type 2 diabetes mellitus ([place]) -at goal, cont same regimen, diet and exercise Lab Results  Component Value Date   CHOL 133 08/16/2018   HDL 55 08/16/2018   LDLCALC 54 08/16/2018   TRIG 120 08/16/2018    7. Primary osteoarthritis involving multiple joints -knees bother her at times with exercise, but not to a point of needing intervention right  now  Labs/tests ordered:  No new--keeps getting done at  endocrine and outside offices  Next appt:  6 mos but also prn  Maykayla Highley L. Bailie Christenbury, D.O. [facility] [address], [place] [address] Cell Phone (Mon-Fri 8am-5pm):  [phone] On Call:  [phone] & follow prompts after 5pm & weekends Office Phone:  [phone] Office Fax:  [phone]

## 2019-08-30 NOTE — Patient Instructions (Addendum)
Resume gabapentin at 100mg  three times a day for your right elbow pain--ulnar neuropathy.    Start wellbutrin 150mg  daily today.  Take fluoxetine 20mg  daily for one week, then 10mg  daily for one week, then stop.

## 2019-08-31 ENCOUNTER — Telehealth: Payer: Self-pay | Admitting: Gastroenterology

## 2019-08-31 NOTE — Telephone Encounter (Signed)
Hey Dr. Loletha Carrow- this patient is being referred for a colonoscopy. Patient had last colonoscopy in 2015. Wants to transfer care to [place]. You are the DOD from 5/26 pm. I am going to send you the records. Please advise on scheduling. Thank you!

## 2019-09-05 ENCOUNTER — Encounter: Payer: Self-pay | Admitting: Internal Medicine

## 2019-09-05 NOTE — Telephone Encounter (Signed)
These are [facility] copies of some procedure photos (EGD and colonoscopy) and brief post-procedural recommendations.  I see that they include a recommendation to have a repeat colonoscopy in 5 years, but is not clear why. I would need full procedure reports (and pathology, if polyps were removed) in order to give advice on whether a colonoscopy is indicated now based on current guidelines.  - HD

## 2019-09-06 NOTE — Telephone Encounter (Signed)
Left message for patient to call back to let her know the records Dr. Loletha Carrow needs.

## 2019-09-06 NOTE — Telephone Encounter (Signed)
Patient returned the call will try to obtain full path report as requested.

## 2019-09-07 ENCOUNTER — Encounter: Payer: Self-pay | Admitting: Internal Medicine

## 2019-09-07 DIAGNOSIS — M542 Cervicalgia: Secondary | ICD-10-CM | POA: Diagnosis not present

## 2019-09-07 DIAGNOSIS — M7918 Myalgia, other site: Secondary | ICD-10-CM | POA: Diagnosis not present

## 2019-09-07 DIAGNOSIS — M5412 Radiculopathy, cervical region: Secondary | ICD-10-CM | POA: Diagnosis not present

## 2019-09-07 DIAGNOSIS — M4722 Other spondylosis with radiculopathy, cervical region: Secondary | ICD-10-CM | POA: Diagnosis not present

## 2019-09-07 NOTE — Telephone Encounter (Signed)
Will need to referral you to Orthopedic to evaluate see whether cortisol injection might help.

## 2019-09-07 NOTE — Telephone Encounter (Signed)
Good morning Ms.Waterworth,Have you taken the Gabapentin three times daily ?

## 2019-09-07 NOTE — Telephone Encounter (Signed)
Routing message to Marlowe Sax, NP. Due to PCP Hollace Kinnier L, DO being out of office.

## 2019-09-07 NOTE — Telephone Encounter (Signed)
Message routed to [place], [place]. Due to PCP Hollace Kinnier L, DO being out of office. Please Advise.

## 2019-09-07 NOTE — Telephone Encounter (Signed)
Message routed to Provider [place], Webb Silversmith, NP. Due to PCP Hollace Kinnier L, DO being out of office.

## 2019-09-08 ENCOUNTER — Encounter: Payer: Self-pay | Admitting: Internal Medicine

## 2019-09-13 ENCOUNTER — Other Ambulatory Visit: Payer: Self-pay | Admitting: Internal Medicine

## 2019-09-13 ENCOUNTER — Encounter: Payer: Self-pay | Admitting: Internal Medicine

## 2019-09-13 DIAGNOSIS — R457 State of emotional shock and stress, unspecified: Secondary | ICD-10-CM

## 2019-09-13 MED ORDER — BUPROPION HCL ER (XL) 300 MG PO TB24: 300.0000 mg | ORAL_TABLET | Freq: Every day | ORAL | 3 refills | Status: DC

## 2019-09-14 ENCOUNTER — Encounter: Payer: Self-pay | Admitting: Internal Medicine

## 2019-09-14 DIAGNOSIS — E1169 Type 2 diabetes mellitus with other specified complication: Secondary | ICD-10-CM | POA: Diagnosis not present

## 2019-09-14 DIAGNOSIS — E785 Hyperlipidemia, unspecified: Secondary | ICD-10-CM | POA: Diagnosis not present

## 2019-09-14 DIAGNOSIS — E119 Type 2 diabetes mellitus without complications: Secondary | ICD-10-CM | POA: Diagnosis not present

## 2019-09-14 DIAGNOSIS — I110 Hypertensive heart disease with heart failure: Secondary | ICD-10-CM | POA: Diagnosis not present

## 2019-09-14 DIAGNOSIS — D649 Anemia, unspecified: Secondary | ICD-10-CM | POA: Diagnosis not present

## 2019-09-14 LAB — LIPID PANEL
Cholesterol: 151 (ref 0–200)
HDL: 65 (ref 35–70)
LDL Cholesterol: 64
Triglycerides: 138 (ref 40–160)

## 2019-09-14 LAB — BASIC METABOLIC PANEL
BUN: 24 — AB (ref 4–21)
CO2: 21 (ref 13–22)
Chloride: 106 (ref 99–108)
Creatinine: 0.8 (ref 0.5–1.1)
Glucose: 123
Potassium: 4.9 (ref 3.4–5.3)
Sodium: 140 (ref 137–147)

## 2019-09-14 LAB — COMPREHENSIVE METABOLIC PANEL
Calcium: 10.2 (ref 8.7–10.7)
GFR calc Af Amer: 88.66
GFR calc non Af Amer: 76.5

## 2019-09-14 LAB — CBC AND DIFFERENTIAL
HCT: 40 (ref 36–46)
Hemoglobin: 13.2 (ref 12.0–16.0)
Platelets: 450 — AB (ref 150–399)
WBC: 8.9

## 2019-09-14 LAB — CBC: RBC: 4.44 (ref 3.87–5.11)

## 2019-09-15 ENCOUNTER — Encounter: Payer: Self-pay | Admitting: Internal Medicine

## 2019-09-15 ENCOUNTER — Other Ambulatory Visit: Payer: Self-pay | Admitting: Internal Medicine

## 2019-09-15 DIAGNOSIS — R457 State of emotional shock and stress, unspecified: Secondary | ICD-10-CM

## 2019-09-15 DIAGNOSIS — F331 Major depressive disorder, recurrent, moderate: Secondary | ICD-10-CM

## 2019-09-15 MED ORDER — VORTIOXETINE HBR 5 MG PO TABS: 5.0000 mg | ORAL_TABLET | Freq: Every day | ORAL | 0 refills | Status: DC

## 2019-09-16 LAB — HM HEPATITIS C SCREENING LAB: HM Hepatitis Screen: NEGATIVE

## 2019-09-20 ENCOUNTER — Encounter: Payer: Self-pay | Admitting: Internal Medicine

## 2019-09-21 DIAGNOSIS — M47812 Spondylosis without myelopathy or radiculopathy, cervical region: Secondary | ICD-10-CM | POA: Diagnosis not present

## 2019-09-21 DIAGNOSIS — M50222 Other cervical disc displacement at C5-C6 level: Secondary | ICD-10-CM | POA: Diagnosis not present

## 2019-09-27 ENCOUNTER — Encounter: Payer: Self-pay | Admitting: Internal Medicine

## 2019-09-27 NOTE — Telephone Encounter (Signed)
MyChart message received. Forwarded to Janett Billow (Dr. Mariea Clonts out of office)

## 2019-10-04 ENCOUNTER — Encounter: Payer: Self-pay | Admitting: Internal Medicine

## 2019-10-04 DIAGNOSIS — F331 Major depressive disorder, recurrent, moderate: Secondary | ICD-10-CM

## 2019-10-04 DIAGNOSIS — R457 State of emotional shock and stress, unspecified: Secondary | ICD-10-CM

## 2019-10-04 NOTE — Telephone Encounter (Signed)
Hold for Dr.Reed per patient request for her to review message.

## 2019-10-05 ENCOUNTER — Other Ambulatory Visit: Payer: Self-pay | Admitting: Internal Medicine

## 2019-10-05 MED ORDER — VORTIOXETINE HBR 10 MG PO TABS: 10.0000 mg | ORAL_TABLET | Freq: Every day | ORAL | 0 refills | Status: DC

## 2019-10-06 DIAGNOSIS — M4722 Other spondylosis with radiculopathy, cervical region: Secondary | ICD-10-CM | POA: Diagnosis not present

## 2019-10-06 DIAGNOSIS — M4802 Spinal stenosis, cervical region: Secondary | ICD-10-CM | POA: Diagnosis not present

## 2019-10-06 DIAGNOSIS — M5013 Cervical disc disorder with radiculopathy, cervicothoracic region: Secondary | ICD-10-CM | POA: Diagnosis not present

## 2019-10-06 DIAGNOSIS — M50123 Cervical disc disorder at C6-C7 level with radiculopathy: Secondary | ICD-10-CM | POA: Diagnosis not present

## 2019-10-11 ENCOUNTER — Encounter: Payer: Self-pay | Admitting: Internal Medicine

## 2019-10-11 NOTE — Telephone Encounter (Signed)
Message routed to Reed, Tiffany L, DO  

## 2019-10-16 ENCOUNTER — Other Ambulatory Visit: Payer: Self-pay | Admitting: Internal Medicine

## 2019-10-19 DIAGNOSIS — Z6823 Body mass index (BMI) 23.0-23.9, adult: Secondary | ICD-10-CM | POA: Diagnosis not present

## 2019-10-19 DIAGNOSIS — M5412 Radiculopathy, cervical region: Secondary | ICD-10-CM | POA: Diagnosis not present

## 2019-10-19 DIAGNOSIS — M50123 Cervical disc disorder at C6-C7 level with radiculopathy: Secondary | ICD-10-CM | POA: Diagnosis not present

## 2019-10-19 DIAGNOSIS — M5013 Cervical disc disorder with radiculopathy, cervicothoracic region: Secondary | ICD-10-CM | POA: Diagnosis not present

## 2019-10-24 ENCOUNTER — Other Ambulatory Visit: Payer: Self-pay | Admitting: Internal Medicine

## 2019-10-28 ENCOUNTER — Other Ambulatory Visit: Payer: Self-pay | Admitting: Internal Medicine

## 2019-10-28 DIAGNOSIS — F331 Major depressive disorder, recurrent, moderate: Secondary | ICD-10-CM

## 2019-10-28 DIAGNOSIS — R457 State of emotional shock and stress, unspecified: Secondary | ICD-10-CM

## 2019-10-30 DIAGNOSIS — M5412 Radiculopathy, cervical region: Secondary | ICD-10-CM | POA: Diagnosis not present

## 2019-11-01 ENCOUNTER — Encounter: Payer: Self-pay | Admitting: Internal Medicine

## 2019-11-01 ENCOUNTER — Other Ambulatory Visit: Payer: Self-pay

## 2019-11-01 ENCOUNTER — Non-Acute Institutional Stay: Payer: Medicare Other | Admitting: Internal Medicine

## 2019-11-01 VITALS — BP 122/78 | HR 84 | Ht 65.0 in | Wt 131.0 lb

## 2019-11-01 DIAGNOSIS — F331 Major depressive disorder, recurrent, moderate: Secondary | ICD-10-CM | POA: Diagnosis not present

## 2019-11-01 DIAGNOSIS — R457 State of emotional shock and stress, unspecified: Secondary | ICD-10-CM | POA: Diagnosis not present

## 2019-11-01 DIAGNOSIS — M25561 Pain in right knee: Secondary | ICD-10-CM

## 2019-11-01 DIAGNOSIS — M25461 Effusion, right knee: Secondary | ICD-10-CM

## 2019-11-01 DIAGNOSIS — E119 Type 2 diabetes mellitus without complications: Secondary | ICD-10-CM | POA: Diagnosis not present

## 2019-11-01 DIAGNOSIS — M5412 Radiculopathy, cervical region: Secondary | ICD-10-CM

## 2019-11-01 DIAGNOSIS — E1169 Type 2 diabetes mellitus with other specified complication: Secondary | ICD-10-CM

## 2019-11-01 DIAGNOSIS — E785 Hyperlipidemia, unspecified: Secondary | ICD-10-CM

## 2019-11-01 MED ORDER — VORTIOXETINE HBR 10 MG PO TABS: 10.0000 mg | ORAL_TABLET | Freq: Every day | ORAL | 3 refills | Status: DC

## 2019-11-01 NOTE — Progress Notes (Signed)
Location:  [place] of Service:  Clinic (12)  Provider: Aarian Griffie L. Mariea Clonts, D.O., C.M.D.  Goals of Care:  Advanced Directives 11/01/2019  Does Patient Have a Medical Advance Directive? Yes  Type of Paramedic of [place];Out of facility DNR (pink MOST or yellow form);Living will  Does patient want to make changes to medical advance directive? No - Patient declined  Copy of [place] in Chart? Yes - validated most recent copy scanned in chart (See row information)  Pre-existing out of facility DNR order (yellow form or pink MOST form) Pink MOST/Yellow Form most recent copy in chart - Physician notified to receive inpatient order   Chief Complaint  Patient presents with  . Acute Visit    Leg pain and forms for a referral with a list of other things to go over.     HPI: Patient is a 74 y.o. female seen today for an acute visit for leg pain which has now moved to her knee and difficulties getting her f/u colonoscopy scheduled due to challenges with new location accepting her old records.  Timmothy Sours has agreed to have someone 5 days per week through [facility].    She had her injection in her neck Monday.  She could feel the medicine hitting the nerves down her arm, but now has no pain in the right arm.  She had felt something pop in her lower leg/calf months ago. It went away altogether.  Now has pain in her right knee that gets very swollen by night to where she can hardly walk on it.  She put ice on it, then heat on it all night last night.  Feels pretty good now.  There is mild swelling of the medial knee.    Colonoscopy--has worked for 2 months trying to get her colonoscopy report from a place in [place] where she had her last one.  Dr. Illene Silver 09/29/13.  She says she brought a copy of the report to [facility] and they would not accept that b/c it was not in color.  Need pathology.   She never got a f/u letter from [place] b/c she had  moved.  Trintellix.  Has some anxiety, but working overall well--wants [facility] to [facility].  8/16 will have hba1c with Dr. Meredith Pel.   Reviewed June labs with her--platelets high and her cholesterol is at goal.    He is seeing a therapist here--[place] with [facility].  Just saw her last Friday.    Past Medical History:  Diagnosis Date  . Allergic rhinitis   . Anxiety   . Atrophic vaginitis 2017  . Depression   . GERD (gastroesophageal reflux disease)   . Hematuria 05/09/2015   had two nonobstructing renal calculi  . Hepatitis A 1970s  . Hyperlipidemia   . Osteopenia   . Shingles 06/04/2015  . SUI (stress urinary incontinence, female) 2017  . Thrombocytopenia ([place])     Past Surgical History:  Procedure Laterality Date  . CATARACT EXTRACTION W/ INTRAOCULAR LENS  IMPLANT, BILATERAL Bilateral   . KNEE ARTHROSCOPY  2004, 1990s  . TONSILLECTOMY AND ADENOIDECTOMY  1952    Allergies  Allergen Reactions  . Keflex [Cephalexin]   . Sulfa Antibiotics     Outpatient Encounter Medications as of 11/01/2019  Medication Sig  . acetaminophen (TYLENOL) 500 MG tablet Take 1 tablet (500 mg total) by mouth every 6 (six) hours as needed.  . ALPRAZolam (XANAX) 0.25 MG tablet Take 0.5 tablets (0.125  mg total) by mouth daily as needed for anxiety.  Marland Kitchen atorvastatin (LIPITOR) 20 MG tablet TAKE 1 TABLET BY MOUTH  DAILY  . esomeprazole (NEXIUM) 20 MG capsule Take 1 capsule (20 mg total) by mouth daily at 12 noon.  . gabapentin (NEURONTIN) 100 MG capsule Take 1 capsule (100 mg total) by mouth 3 (three) times daily.  Marland Kitchen JARDIANCE 10 MG TABS tablet TAKE 1 TABLET BY MOUTH ONCE DAILY  . ketoconazole (NIZORAL) 2 % cream APPLY 1 APPLICATION TOPICALLY 2 (TWO) TIMES DAILY AS NEEDED (GROIN RASH).  Marland Kitchen losartan (COZAAR) 100 MG tablet Take 100 mg by mouth daily.  . metFORMIN (GLUCOPHAGE) 1000 MG tablet TAKE 1 TABLET BY MOUTH TWO  TIMES DAILY WITH A MEAL  . Multiple Vitamins-Minerals (WOMENS 50+ MULTI VITAMIN/MIN PO)  Take by mouth.  . vortioxetine HBr (TRINTELLIX) 10 MG TABS tablet Take 1 tablet (10 mg total) by mouth daily.  . [DISCONTINUED] TRINTELLIX 10 MG TABS tablet TAKE 1 TABLET BY MOUTH EVERY DAY   No facility-administered encounter medications on file as of 11/01/2019.    Review of Systems:  Review of Systems  Constitutional: Negative for chills, fever and malaise/fatigue.  HENT: Negative for congestion and sore throat.   Eyes: Negative for blurred vision.  Respiratory: Negative for cough and shortness of breath.   Cardiovascular: Negative for chest pain, palpitations and leg swelling.  Gastrointestinal: Negative for abdominal pain, blood in stool, constipation, diarrhea and melena.  Genitourinary: Negative for dysuria.  Musculoskeletal: Positive for joint pain. Negative for falls.  Skin: Negative for itching and rash.  Neurological: Positive for tingling and sensory change. Negative for dizziness and loss of consciousness.  Psychiatric/Behavioral: Positive for depression. Negative for memory loss. The patient is nervous/anxious. The patient does not have insomnia.        Spirits improved    Health Maintenance  Topic Date Due  . TETANUS/TDAP  04/06/2017  . HEMOGLOBIN A1C  02/16/2019  . INFLUENZA VACCINE  11/05/2019  . FOOT EXAM  04/16/2020  . OPHTHALMOLOGY EXAM  07/04/2020  . DEXA SCAN  Completed  . COVID-19 Vaccine  Completed  . Hepatitis C Screening  Completed  . PNA vac Low Risk Adult  Completed    Physical Exam: Vitals:   11/01/19 1000  BP: 122/78  Pulse: 84  SpO2: 96%  Weight: 131 lb (59.4 kg)  Height: 5\' 5"  (1.651 m)   Body mass index is 21.8 kg/m. Physical Exam Vitals reviewed.  Constitutional:      General: She is not in acute distress.    Appearance: Normal appearance. She is not ill-appearing or toxic-appearing.  HENT:     Head: Normocephalic and atraumatic.  Cardiovascular:     Rate and Rhythm: Normal rate and regular rhythm.     Pulses: Normal pulses.      Heart sounds: Normal heart sounds.  Pulmonary:     Effort: Pulmonary effort is normal.     Breath sounds: Normal breath sounds. No wheezing, rhonchi or rales.  Abdominal:     General: Bowel sounds are normal.  Musculoskeletal:        General: Tenderness present.     Right lower leg: No edema.     Left lower leg: No edema.     Comments: Of right medial knee  Skin:    General: Skin is warm and dry.  Neurological:     General: No focal deficit present.     Mental Status: She is alert and oriented to person, place,  and time.  Psychiatric:        Mood and Affect: Mood normal.     Comments: Calmer today     Labs reviewed: Basic Metabolic Panel: Recent Labs    09/14/19 0000  NA 140  K 4.9  CL 106  CO2 21  BUN 24*  CREATININE 0.8  CALCIUM 10.2   Liver Function Tests: No results for input(s): AST, ALT, ALKPHOS, BILITOT, PROT, ALBUMIN in the last 8760 hours. No results for input(s): LIPASE, AMYLASE in the last 8760 hours. No results for input(s): AMMONIA in the last 8760 hours. CBC: Recent Labs    09/14/19 0000  WBC 8.9  HGB 13.2  HCT 40  PLT 450*   Lipid Panel: Recent Labs    09/14/19 0000  CHOL 151  HDL 65  LDLCALC 64  TRIG 138   Lab Results  Component Value Date   HGBA1C 7.7 08/16/2018    Assessment/Plan 1. Pain and swelling of right knee -swelling is minimal -this began after a pop in her lower leg when vacuuming months ago -now knee involved (not clear if related) - Ambulatory referral to Orthopedic Surgery at her request -using ibuprofen, did ice at the beginning  2. Caregiver stress syndrome - this is a considerable issue for her--her husband has Parkinson's and now some cognitive impairment and is very strongwilled and independent -she is getting counseling with [facility] - vortioxetine HBr (TRINTELLIX) 10 MG TABS tablet; Take 1 tablet (10 mg total) by mouth daily.  Dispense: 90 tablet; Refill: 3  3. Moderate episode of recurrent major  depressive disorder ([place]) - doing well on trintellix so continue it -vortioxetine HBr (TRINTELLIX) 10 MG TABS tablet; Take 1 tablet (10 mg total) by mouth daily.  Dispense: 90 tablet; Refill: 3  4. Type 2 diabetes mellitus without complication, without long-term current use of insulin (HCC) -will get hba1c next month with endocrine  5. Hyperlipidemia associated with type 2 diabetes mellitus ([place]) -June LDL at goal (64), cont same mgt and monitor  6. Cervical radiculopathy -injection was really helpful for her -cont f/u with neurosurg team  Labs/tests ordered:  Has labs with endocrine--requested she ask her to send me her notes/labs  Next appt:  02/28/2020  Fidel Caggiano L. Arnol Mcgibbon, D.O. [facility] [address], [place] [address] Cell Phone (Mon-Fri 8am-5pm):  [phone] On Call:  [phone] & follow prompts after 5pm & weekends Office Phone:  [phone] Office Fax:  [phone]

## 2019-11-06 ENCOUNTER — Telehealth: Payer: Self-pay

## 2019-11-06 NOTE — Telephone Encounter (Signed)
[facility] need notes indicating the reason for repeat Colonoscopy. Notes received and sent to [facility] [place] appointment should be scheduled soon. Lattie Haw can you please follow up on the appointment date?

## 2019-11-09 ENCOUNTER — Ambulatory Visit (INDEPENDENT_AMBULATORY_CARE_PROVIDER_SITE_OTHER): Payer: Medicare Other | Admitting: Family Medicine

## 2019-11-09 ENCOUNTER — Other Ambulatory Visit: Payer: Self-pay

## 2019-11-09 ENCOUNTER — Other Ambulatory Visit: Payer: Self-pay | Admitting: Internal Medicine

## 2019-11-09 ENCOUNTER — Ambulatory Visit: Payer: Self-pay

## 2019-11-09 ENCOUNTER — Encounter: Payer: Self-pay | Admitting: Family Medicine

## 2019-11-09 DIAGNOSIS — M25561 Pain in right knee: Secondary | ICD-10-CM

## 2019-11-09 DIAGNOSIS — M1711 Unilateral primary osteoarthritis, right knee: Secondary | ICD-10-CM

## 2019-11-09 NOTE — Patient Instructions (Signed)
    Vitamin D3:  5,000 IU daily  Vitamin K2:  100 mcg daily  Magnesium:  400 mg daily   If able, gradually wean from Nexium and switch to Pepcid as-needed.  Ideally, change diet to avoid acid reflux symptoms.

## 2019-11-09 NOTE — Telephone Encounter (Signed)
Received reports sending them to Dr. Loletha Carrow for further review.

## 2019-11-09 NOTE — Progress Notes (Signed)
Office Visit Note   Patient: Kimberly [name]           Date of Birth: Jul 02, 1945           MRN: [id number] Visit Date: 11/09/2019 Requested by: Gayland Curry, DO [place],  [place] [id number] PCP: Gayland Curry, DO  Subjective: Chief Complaint  Patient presents with  . Right Knee - Pain    Intermittent pain in the medial aspect of the knee (aches) since January - was vacuuming when she felt & heard a pop in the right calf. Was checked by her PCP - got better. The pain has worsened over the past month. Worse at night.    HPI: She is here with right knee pain.  Back in January while vacuuming, she felt something pop in her calf.  She went to her PCP, no x-rays were needed.  Eventually that pain resolved.  Then about a month ago she started noticing pain on the medial side of her knee.  She has been using Voltaren gel with some relief.  No locking or giving way, no further popping and no further calf pain.  She has a history of osteoarthritis in multiple joints.  She also has a history of osteoporosis.  Her last bone density test was stable.  She is no longer on prescription medicines but is taking some vitamin D and a multivitamin.  She does not smoke, but she does take Nexium for chronic GERD.               ROS:   All other systems were reviewed and are negative.  Objective: Vital Signs: There were no vitals taken for this visit.  Physical Exam:  General:  Alert and oriented, in no acute distress. Pulm:  Breathing unlabored. Psy:  Normal mood, congruent affect. Skin: No erythema or warmth Right knee: Trace effusion, full active extension.  She has 1+ patellofemoral crepitus.  No pain with patella compression.  Flexion motion is 130 degrees.  Lachman's is solid, no laxity with varus or valgus stress.  She is very tender on the medial joint line and has pain but no palpable click with McMurray's.  No palpable popliteal cyst.  Imaging: XR Knee 1-2 Views Right  Result Date:  11/09/2019 X-rays of the right knee reveal moderate medial compartment spurring and moderate patellofemoral spurring.  Tibiofemoral joint space is still fairly well-preserved overall.  No sign of loose body or OCD.   Assessment & Plan: 1.  Right knee pain, possibly due to medial compartment DJD.  Cannot rule out degenerative meniscus tear. -We discussed options, because of her diabetes and osteoporosis we elected to avoid another cortisone injection today.  She has already had 3 in the recent past.  We will instead seek approval for gel injections. -She will start taking higher dose of vitamin D3, vitamin K 2 and magnesium for bone strength. -She will see if she can wean from Nexium and modify her diet so that she does not need acid blocking medication, since proton pump inhibitors have been linked to osteoporosis.     Procedures: No procedures performed  No notes on file     PMFS History: Patient Active Problem List   Diagnosis Date Noted  . Chronic right-sided low back pain with right-sided sciatica 07/21/2018  . Cervical radiculopathy 07/21/2018  . 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 Uncontrolled hypertension 11/22/2016  . Adjustment disorder with anxious mood 02/04/2016  . Hyperlipidemia associated with type  2 diabetes mellitus ([place]) 02/04/2016  . Type 2 diabetes mellitus without complication, without long-term current use of insulin ([place]) 02/04/2016  . Basal cell carcinoma of left nasal sidewall 10/22/2011   Past Medical History:  Diagnosis Date  . Allergic rhinitis   . Anxiety   . Atrophic vaginitis 2017  . Depression   . GERD (gastroesophageal reflux disease)   . Hematuria 05/09/2015   had two nonobstructing renal calculi  . Hepatitis A 1970s  . Hyperlipidemia   . Osteopenia   . Shingles 06/04/2015  . SUI (stress urinary incontinence, female) 2017  . Thrombocytopenia ([place])     Family History  Problem Relation Age of Onset  . Heart disease Father        heart  attack  1980  . Stroke Father        min- stroke  12  . Cancer Brother        colon (2004)    Past Surgical History:  Procedure Laterality Date  . CATARACT EXTRACTION W/ INTRAOCULAR LENS  IMPLANT, BILATERAL Bilateral   . KNEE ARTHROSCOPY  2004, 1990s  . TONSILLECTOMY AND ADENOIDECTOMY  1952   Social History   Occupational History  . Not on file  Tobacco Use  . Smoking status: Never Smoker  . Smokeless tobacco: Never Used  Substance and Sexual Activity  . Alcohol use: Never  . Drug use: Not on file  . Sexual activity: Not on file

## 2019-11-13 ENCOUNTER — Telehealth: Payer: Self-pay

## 2019-11-13 NOTE — Telephone Encounter (Signed)
Approved for Synvisc one- right knee Dr. Junius Roads Buy and Bill $35 copay $20 % OOP No prior auth required

## 2019-11-13 NOTE — Telephone Encounter (Signed)
Submitted for VOB for Synvisc one-Right knee  °

## 2019-11-13 NOTE — Telephone Encounter (Signed)
Lvm for pt to call back to schedule   Ok to schedule @ next available  

## 2019-11-13 NOTE — Telephone Encounter (Signed)
-----   Message from Ozzie Hoyle, [place] sent at 11/09/2019  2:21 PM EDT ----- . ----- Message ----- From: Eunice Blase, MD Sent: 11/09/2019  11:14 AM EDT To: April Jackson, RMA  Please request gel injection approval for right knee OA.

## 2019-11-13 NOTE — Telephone Encounter (Signed)
(  For documentation purposes:   Colonoscopy report from Dr. Yong Channel in [place], [place], dated 09/29/2013.  Indication was for family history of colon cancer in a first-degree relative.  Complete exam to cecum, good preparation, reportedly normal colon.)  ________  No polyps were found on the last colonoscopy, however the performing physician indicated that the patient had a family history of colon cancer in a first-degree relative.  If the patient in fact has a history of colon cancer in a parent or sibling, then she is due for a screening colonoscopy since it has been more than 5 years since the last procedure.  If she does NOT have that family history, then current guidelines indicate she does not need any further screening colonoscopies.  If positive family history and colonoscopy now indicated as above, patient can be directly booked through [facility].  - HD

## 2019-11-14 NOTE — Telephone Encounter (Signed)
Called and scheduled pt. She is aware of copay

## 2019-11-15 ENCOUNTER — Encounter: Payer: Self-pay | Admitting: Gastroenterology

## 2019-11-15 ENCOUNTER — Encounter: Payer: Self-pay | Admitting: Internal Medicine

## 2019-11-15 ENCOUNTER — Telehealth: Payer: Self-pay | Admitting: Family Medicine

## 2019-11-15 NOTE — Telephone Encounter (Signed)
Patient called to advise Kimberly [name] that she already have an appointment with Dr Junius Roads. Patient also asked if she will need a driver after the injection. The number to contact patient is [phone]

## 2019-11-16 NOTE — Telephone Encounter (Signed)
Called pt. She stated she was already contacted with the answer

## 2019-11-19 ENCOUNTER — Encounter: Payer: Self-pay | Admitting: Internal Medicine

## 2019-11-20 ENCOUNTER — Other Ambulatory Visit: Payer: Self-pay | Admitting: Internal Medicine

## 2019-11-20 DIAGNOSIS — M50123 Cervical disc disorder at C6-C7 level with radiculopathy: Secondary | ICD-10-CM | POA: Diagnosis not present

## 2019-11-20 DIAGNOSIS — E119 Type 2 diabetes mellitus without complications: Secondary | ICD-10-CM

## 2019-11-20 DIAGNOSIS — M5412 Radiculopathy, cervical region: Secondary | ICD-10-CM | POA: Diagnosis not present

## 2019-11-20 DIAGNOSIS — M5013 Cervical disc disorder with radiculopathy, cervicothoracic region: Secondary | ICD-10-CM | POA: Diagnosis not present

## 2019-11-20 MED ORDER — DAPAGLIFLOZIN PROPANEDIOL 5 MG PO TABS: 5.0000 mg | ORAL_TABLET | Freq: Every day | ORAL | 5 refills | Status: DC

## 2019-11-20 NOTE — Telephone Encounter (Signed)
Message Routed to Dr. Mariea Clonts MD

## 2019-11-21 ENCOUNTER — Other Ambulatory Visit: Payer: Self-pay

## 2019-11-21 ENCOUNTER — Encounter: Payer: Self-pay | Admitting: Family Medicine

## 2019-11-21 ENCOUNTER — Ambulatory Visit (INDEPENDENT_AMBULATORY_CARE_PROVIDER_SITE_OTHER): Payer: Medicare Other | Admitting: Family Medicine

## 2019-11-21 DIAGNOSIS — M1711 Unilateral primary osteoarthritis, right knee: Secondary | ICD-10-CM | POA: Diagnosis not present

## 2019-11-21 NOTE — Progress Notes (Signed)
Subjective: She is here for planned right knee Synvisc 1 injection.  Objective: No significant effusion today.  Procedure: Right knee Synvisc 1 injection: After sterile prep with Betadine, injected 3 cc 1% lidocaine without epinephrine and Synvisc 1 from lateral midpatellar approach without complication.

## 2019-11-23 ENCOUNTER — Other Ambulatory Visit: Payer: Self-pay | Admitting: Internal Medicine

## 2019-11-23 DIAGNOSIS — F331 Major depressive disorder, recurrent, moderate: Secondary | ICD-10-CM

## 2019-11-23 DIAGNOSIS — R457 State of emotional shock and stress, unspecified: Secondary | ICD-10-CM

## 2019-11-29 DIAGNOSIS — E119 Type 2 diabetes mellitus without complications: Secondary | ICD-10-CM | POA: Diagnosis not present

## 2019-11-29 DIAGNOSIS — R000 Tachycardia, unspecified: Secondary | ICD-10-CM | POA: Diagnosis not present

## 2019-11-29 DIAGNOSIS — E782 Mixed hyperlipidemia: Secondary | ICD-10-CM | POA: Diagnosis not present

## 2019-11-29 DIAGNOSIS — I10 Essential (primary) hypertension: Secondary | ICD-10-CM | POA: Diagnosis not present

## 2019-11-30 ENCOUNTER — Telehealth: Payer: Self-pay

## 2019-11-30 ENCOUNTER — Encounter: Payer: Self-pay | Admitting: Internal Medicine

## 2019-11-30 NOTE — Telephone Encounter (Signed)
T Dap  was recorded for 11/30/2019

## 2019-12-04 ENCOUNTER — Encounter: Payer: Self-pay | Admitting: Internal Medicine

## 2019-12-05 MED ORDER — ALPRAZOLAM 0.25 MG PO TABS
0.1250 mg | ORAL_TABLET | Freq: Every day | ORAL | 0 refills | Status: DC | PRN
Start: 2019-12-05 — End: 2020-01-03

## 2019-12-24 ENCOUNTER — Encounter: Payer: Self-pay | Admitting: Internal Medicine

## 2019-12-25 ENCOUNTER — Other Ambulatory Visit: Payer: Self-pay | Admitting: *Deleted

## 2019-12-25 ENCOUNTER — Encounter: Payer: Self-pay | Admitting: Internal Medicine

## 2019-12-25 DIAGNOSIS — B372 Candidiasis of skin and nail: Secondary | ICD-10-CM

## 2019-12-25 MED ORDER — ONETOUCH VERIO VI STRP
ORAL_STRIP | 3 refills | Status: DC
Start: 2019-12-25 — End: 2020-12-18

## 2019-12-25 MED ORDER — KETOCONAZOLE 2 % EX CREA: 1.0000 "application " | TOPICAL_CREAM | Freq: Two times a day (BID) | CUTANEOUS | 1 refills | Status: DC | PRN

## 2019-12-25 NOTE — Telephone Encounter (Signed)
Patient requested refill Pended Rx for approval due to [place].

## 2019-12-26 ENCOUNTER — Telehealth: Payer: Self-pay | Admitting: Internal Medicine

## 2019-12-26 ENCOUNTER — Encounter: Payer: Self-pay | Admitting: Internal Medicine

## 2019-12-26 DIAGNOSIS — F411 Generalized anxiety disorder: Secondary | ICD-10-CM

## 2019-12-26 DIAGNOSIS — F322 Major depressive disorder, single episode, severe without psychotic features: Secondary | ICD-10-CM

## 2019-12-26 DIAGNOSIS — R457 State of emotional shock and stress, unspecified: Secondary | ICD-10-CM

## 2019-12-26 NOTE — Telephone Encounter (Signed)
Can you look into other options?  [facility].  She has already met with the counselor [facility] from [place].  I wanted her to see a psychiatrist who can prescribe her medication for her depression b/c what I have tried has failed thus far.

## 2019-12-26 NOTE — Telephone Encounter (Signed)
Ms Kimberly [name] called office today emotional bc she has reached out to 2 different phychiatric providers that you recommended.   1 had no availability until Nov 2021 & other office no availability until Feb 2022.  She's overwhelmed with cargiving for her husband with Parkinsons & can find no one for personal counseling.  Thanks, Kathyrn Lass

## 2019-12-27 ENCOUNTER — Encounter: Payer: Self-pay | Admitting: Internal Medicine

## 2019-12-28 ENCOUNTER — Telehealth: Payer: Self-pay

## 2019-12-28 NOTE — Telephone Encounter (Signed)
Dr.Plovsky called requesting to speak with Gayland Curry, DO.  I informed Dr.Plovsky that Dr.Reed is out of office today and he provided me with his cell phone number.   Please return call when available

## 2020-01-02 ENCOUNTER — Other Ambulatory Visit: Payer: Self-pay

## 2020-01-02 ENCOUNTER — Ambulatory Visit (AMBULATORY_SURGERY_CENTER): Payer: Medicare Other | Admitting: *Deleted

## 2020-01-02 VITALS — Ht 61.0 in | Wt 127.0 lb

## 2020-01-02 DIAGNOSIS — Z800 Family history of malignant neoplasm of digestive organs: Secondary | ICD-10-CM

## 2020-01-02 MED ORDER — PLENVU 140 G PO SOLR: 1.0000 | ORAL | 0 refills | Status: DC

## 2020-01-02 NOTE — Telephone Encounter (Signed)
Pt will be seen on site at [facility] today by Dr. Casimiro Needle according to Collie Siad, DON.

## 2020-01-02 NOTE — Progress Notes (Signed)
Cov Vax x 2 No egg or soy allergy known to patient   issues with past sedation with any surgeries or procedures of PONV  no intubation problems in the past  No FH of Malignant Hyperthermia No diet pills per patient No home 02 use per patient  No blood thinners per patient  Pt denies issues with constipation  No A fib or A flutter  EMMI video to pt or via [place] 19 guidelines implemented in PV today with Pt and RN   [place] Medicare  Coupon given to pt in PV today , Code to Pharmacy - pt instructed to call and activate then take to pharmacy   Due to the COVID-19 pandemic we are asking patients to follow these guidelines. Please only bring one care partner. Please be aware that your care partner may wait in the car in the parking lot or if they feel like they will be too hot to wait in the car, they may wait in the lobby on the 4th floor. All care partners are required to wear a mask the entire time (we do not have any that we can provide them), they need to practice social distancing, and we will do a Covid check for all patient's and care partners when you arrive. Also we will check their temperature and your temperature. If the care partner waits in their car they need to stay in the parking lot the entire time and we will call them on their cell phone when the patient is ready for discharge so they can bring the car to the front of the building. Also all patient's will need to wear a mask into building.

## 2020-01-03 ENCOUNTER — Encounter: Payer: Self-pay | Admitting: Internal Medicine

## 2020-01-03 ENCOUNTER — Non-Acute Institutional Stay: Payer: Medicare Other | Admitting: Internal Medicine

## 2020-01-03 ENCOUNTER — Encounter: Payer: Self-pay | Admitting: Gastroenterology

## 2020-01-03 VITALS — BP 122/78 | HR 95 | Temp 97.3°F | Ht 61.0 in | Wt 127.6 lb

## 2020-01-03 DIAGNOSIS — M5412 Radiculopathy, cervical region: Secondary | ICD-10-CM

## 2020-01-03 DIAGNOSIS — F411 Generalized anxiety disorder: Secondary | ICD-10-CM | POA: Diagnosis not present

## 2020-01-03 DIAGNOSIS — R457 State of emotional shock and stress, unspecified: Secondary | ICD-10-CM | POA: Diagnosis not present

## 2020-01-03 DIAGNOSIS — F322 Major depressive disorder, single episode, severe without psychotic features: Secondary | ICD-10-CM

## 2020-01-03 DIAGNOSIS — E1142 Type 2 diabetes mellitus with diabetic polyneuropathy: Secondary | ICD-10-CM

## 2020-01-03 NOTE — Telephone Encounter (Signed)
Message routed to Reed, Tiffany L, DO  

## 2020-01-03 NOTE — Progress Notes (Signed)
Location:  [facility] Provider: Laurencia Roma L. Mariea Clonts, D.O., C.M.D.  Goals of Care:  Advanced Directives 01/03/2020  Does Patient Have a Medical Advance Directive? Yes  Type of Advance Directive [place]  Does patient want to make changes to medical advance directive? No - Patient declined  Copy of [place] in Chart? Yes - validated most recent copy scanned in chart (See row information)  Pre-existing out of facility DNR order (yellow form or pink MOST form) -     Chief Complaint  Patient presents with  . Acute Visit    follow up on mental health appointment to discuss changes  . Health Maintenance    Influenza ([place]) A1C    HPI: Patient is a 74 y.o. female seen today for an acute visit for  Dr. Casimiro Needle started her on a low dose of lorazepam.  She took her trintellix this am.  They are traveling to [facility] this weekend and Don's going along.  She thinks this will be their last vacation and he knows that.  They are flying down fri and coming back Monday.    She has a caregiver from 6-9am each morning.  Will soon need an evening aide, as well.  He is still walking around with the walker.  They've cried a lot together.  She's not ready for him to move to [place] either.  She said they will have 24 hr care first.  Daisie knows if he cannot walk or take himself to the bathroom, he will need to move to skilled care.  She notices his mind is not as clear, he's not on the computer as much, and he gets angry and agitated more easily.  He's also been working on getting his affairs in order.    She reread the colonoscopy info and thinks she can do it now.    We had switched her over to [place] from [place] due to insurance coverage.  She actually had some jardiance left and just began the farxiga this week.  No problems so far.  Gets hba1c done through her endocrinologist.  Last was in April of 2021 7.7.  Discussed continuing the gabapentin b/c her neuropathic pain is  not bothering her now and that's probably why.  Also resting well.     Past Medical History:  Diagnosis Date  . Allergic rhinitis   . Allergy   . Anemia    as child   . Anxiety   . Arthritis   . Atrophic vaginitis 2017  . Cancer ([place])    MOHS nose- skin cancer- basal cell   . Cataract    removed both eyes   . Depression   . Diabetes mellitus without complication ([place])   . GERD (gastroesophageal reflux disease)   . Heart murmur    as child- outgrown   . Hematuria 05/09/2015   had two nonobstructing renal calculi  . Hepatitis A 1970s  . Hyperlipidemia   . Hypertension   . Osteopenia   . PONV (postoperative nausea and vomiting)   . Shingles 06/04/2015  . SUI (stress urinary incontinence, female) 2017  . Thrombocytopenia ([place])     Past Surgical History:  Procedure Laterality Date  . BACK SURGERY  11/2018  . CATARACT EXTRACTION W/ INTRAOCULAR LENS  IMPLANT, BILATERAL Bilateral   . COLONOSCOPY    . KNEE ARTHROSCOPY  2004, 1990s  . TONSILLECTOMY AND ADENOIDECTOMY  1952    Allergies  Allergen Reactions  . Keflex [Cephalexin]  Outpatient Encounter Medications as of 01/03/2020  Medication Sig  . acetaminophen (TYLENOL) 500 MG tablet Take 1 tablet (500 mg total) by mouth every 6 (six) hours as needed.  . ALPRAZolam (XANAX) 0.25 MG tablet Take 0.5 tablets (0.125 mg total) by mouth daily as needed for anxiety.  Marland Kitchen atorvastatin (LIPITOR) 20 MG tablet TAKE 1 TABLET BY MOUTH  DAILY  . Cholecalciferol (DIALYVITE VITAMIN D 5000 PO) Take by mouth.  . dapagliflozin propanediol (FARXIGA) 5 MG TABS tablet Take 1 tablet (5 mg total) by mouth daily before breakfast.  . esomeprazole (NEXIUM) 20 MG capsule Take 20 mg by mouth daily at 12 noon. otc  . gabapentin (NEURONTIN) 100 MG capsule Take 1 capsule (100 mg total) by mouth 3 (three) times daily.  Marland Kitchen glucose blood (ONETOUCH VERIO) test strip Use to test blood sugar daily. Dx: E11.9  . ketoconazole (NIZORAL) 2 % cream Apply 1  application topically 2 (two) times daily as needed (groin rash).  Marland Kitchen losartan (COZAAR) 100 MG tablet Take 100 mg by mouth daily.  . Magnesium 400 MG CAPS Take by mouth.  . metFORMIN (GLUCOPHAGE) 1000 MG tablet TAKE 1 TABLET BY MOUTH TWO  TIMES DAILY WITH A MEAL  . Multiple Vitamins-Minerals (WOMENS 50+ MULTI VITAMIN/MIN PO) Take by mouth.  Marland Kitchen PEG-KCl-NaCl-NaSulf-Na Asc-C (PLENVU) 140 g SOLR Take 1 kit by mouth as directed. Pt has a manufacturers medicare coupon to activate and bring  . vitamin k 100 MCG tablet Take 100 mcg by mouth daily.  Marland Kitchen vortioxetine HBr (TRINTELLIX) 10 MG TABS tablet Take 1 tablet (10 mg total) by mouth daily.   No facility-administered encounter medications on file as of 01/03/2020.    Review of Systems:  Review of Systems  Constitutional: Negative for chills, fever and malaise/fatigue.  HENT: Negative for congestion.   Eyes: Negative for blurred vision.  Respiratory: Negative for cough and shortness of breath.   Cardiovascular: Negative for chest pain, palpitations and leg swelling.  Gastrointestinal: Negative for abdominal pain, blood in stool, constipation and melena.  Genitourinary: Negative for dysuria.  Musculoskeletal: Negative for back pain, falls, joint pain, myalgias and neck pain.  Skin: Negative for itching and rash.  Neurological: Negative for dizziness, loss of consciousness and weakness.  Psychiatric/Behavioral: Positive for depression. Negative for hallucinations, memory loss and suicidal ideas. The patient is nervous/anxious. The patient does not have insomnia.     Health Maintenance  Topic Date Due  . HEMOGLOBIN A1C  02/16/2019  . INFLUENZA VACCINE  11/05/2019  . FOOT EXAM  04/16/2020  . OPHTHALMOLOGY EXAM  07/04/2020  . TETANUS/TDAP  11/29/2029  . DEXA SCAN  Completed  . COVID-19 Vaccine  Completed  . Hepatitis C Screening  Completed  . PNA vac Low Risk Adult  Completed    Physical Exam: Vitals:   01/03/20 0824  BP: 122/78  Pulse: 95   Temp: (!) 97.3 F (36.3 C)  SpO2: 96%  Weight: 127 lb 9.6 oz (57.9 kg)  Height: 5' 1"  (1.549 m)   Body mass index is 24.11 kg/m. Physical Exam Vitals reviewed.  Constitutional:      General: She is not in acute distress.    Appearance: Normal appearance. She is not toxic-appearing.  HENT:     Head: Normocephalic and atraumatic.  Cardiovascular:     Rate and Rhythm: Normal rate and regular rhythm.     Pulses: Normal pulses.     Heart sounds: Normal heart sounds.  Pulmonary:     Effort: Pulmonary effort  is normal.     Breath sounds: Normal breath sounds. No wheezing, rhonchi or rales.  Abdominal:     General: Bowel sounds are normal.  Musculoskeletal:        General: Normal range of motion.     Right lower leg: No edema.     Left lower leg: No edema.  Skin:    General: Skin is warm and dry.  Neurological:     General: No focal deficit present.     Mental Status: She is alert and oriented to person, place, and time.     Gait: Gait normal.  Psychiatric:        Behavior: Behavior normal.     Comments: Tearful throughout visit (see hpi)     Labs reviewed: Basic Metabolic Panel: Recent Labs    09/14/19 0000  NA 140  K 4.9  CL 106  CO2 21  BUN 24*  CREATININE 0.8  CALCIUM 10.2   Liver Function Tests: No results for input(s): AST, ALT, ALKPHOS, BILITOT, PROT, ALBUMIN in the last 8760 hours. No results for input(s): LIPASE, AMYLASE in the last 8760 hours. No results for input(s): AMMONIA in the last 8760 hours. CBC: Recent Labs    09/14/19 0000  WBC 8.9  HGB 13.2  HCT 40  PLT 450*   Lipid Panel: Recent Labs    09/14/19 0000  CHOL 151  HDL 65  LDLCALC 64  TRIG 138   Lab Results  Component Value Date   HGBA1C 7.7 08/16/2018    Procedures since last visit: No results found.  Assessment/Plan 1. Caregiver stress syndrome -severe, gradually increasing assistance at home for [facility] and planning for the future   2. Depression, major, single episode,  severe ([place]) -cont trintellix is my impression, but checking with psych  3. GAD (generalized anxiety disorder) -started on lorazepam, awaiting dose from Dr. Casimiro Needle and guidance on trintellix (I thought she should continue it)  4. Type 2 diabetes mellitus with diabetic polyneuropathy, without long-term current use of insulin ([place]) -continue farxiga, followed by endocrine, but has not seen since april  5. Cervical radiculopathy -cont gabapentin--historically this pain returned when she stopped it   Labs/tests ordered:  * No order type specified * Next appt:  02/28/2020  Latravia Southgate L. Lashunta Frieden, D.O. [facility] [address], [place] [address] Cell Phone (Mon-Fri 8am-5pm):  [phone] On Call:  [phone] & follow prompts after 5pm & weekends Office Phone:  [phone] Office Fax:  [phone]

## 2020-01-12 ENCOUNTER — Telehealth: Payer: Self-pay | Admitting: Gastroenterology

## 2020-01-12 NOTE — Telephone Encounter (Signed)
Lm on vm for patient to return call 

## 2020-01-12 NOTE — Telephone Encounter (Signed)
Spoke with patient, she is wanting to cancel upcoming colon, she states that her glucose reading have been in the 500s and they want to get that under control prior to proceeding with colonoscopy. Pt states that she has also been dealing with anxiety and depression. Advised patient to call us when she is ready to reschedule.

## 2020-01-16 ENCOUNTER — Encounter: Payer: Medicare Other | Admitting: Gastroenterology

## 2020-01-22 ENCOUNTER — Encounter: Payer: Self-pay | Admitting: Internal Medicine

## 2020-01-22 ENCOUNTER — Other Ambulatory Visit: Payer: Self-pay | Admitting: Internal Medicine

## 2020-02-10 DIAGNOSIS — Z1212 Encounter for screening for malignant neoplasm of rectum: Secondary | ICD-10-CM | POA: Diagnosis not present

## 2020-02-10 DIAGNOSIS — Z1211 Encounter for screening for malignant neoplasm of colon: Secondary | ICD-10-CM | POA: Diagnosis not present

## 2020-02-12 LAB — COLOGUARD: Cologuard: NEGATIVE

## 2020-02-23 ENCOUNTER — Telehealth: Payer: Self-pay

## 2020-02-23 NOTE — Telephone Encounter (Signed)
I spoke with Marcie Bal and gave her the negative result for her cologuard test

## 2020-02-28 ENCOUNTER — Other Ambulatory Visit: Payer: Self-pay

## 2020-02-28 ENCOUNTER — Encounter: Payer: Self-pay | Admitting: Internal Medicine

## 2020-02-28 ENCOUNTER — Non-Acute Institutional Stay: Payer: Medicare Other | Admitting: Internal Medicine

## 2020-02-28 VITALS — BP 138/82 | HR 100 | Temp 97.7°F | Ht 61.0 in | Wt 123.0 lb

## 2020-02-28 DIAGNOSIS — E119 Type 2 diabetes mellitus without complications: Secondary | ICD-10-CM

## 2020-02-28 DIAGNOSIS — F332 Major depressive disorder, recurrent severe without psychotic features: Secondary | ICD-10-CM | POA: Diagnosis not present

## 2020-02-28 DIAGNOSIS — F411 Generalized anxiety disorder: Secondary | ICD-10-CM | POA: Diagnosis not present

## 2020-02-28 DIAGNOSIS — R457 State of emotional shock and stress, unspecified: Secondary | ICD-10-CM | POA: Diagnosis not present

## 2020-02-28 MED ORDER — DAPAGLIFLOZIN PROPANEDIOL 5 MG PO TABS: 5.0000 mg | ORAL_TABLET | Freq: Every day | ORAL | 3 refills | Status: DC

## 2020-02-28 MED ORDER — LORAZEPAM 0.5 MG PO TABS: ORAL_TABLET | ORAL | 1 refills | Status: DC

## 2020-02-28 NOTE — Progress Notes (Signed)
Location:  Occupational psychologist of Service:  Clinic (12)  Provider: Evalin Shawhan L. Mariea Clonts, D.O., C.M.D.  Goals of Care:  Advanced Directives 01/03/2020  Does Patient Have a Medical Advance Directive? Yes  Type of Advance Directive [place]  Does patient want to make changes to medical advance directive? No - Patient declined  Copy of [place] in Chart? Yes - validated most recent copy scanned in chart (See row information)  Pre-existing out of facility DNR order (yellow form or pink MOST form) -     Chief Complaint  Patient presents with  . Medical Management of Chronic Issues    6 month follow up. Discuss new medications     HPI: Patient is a 74 y.o. female seen today for medical management of chronic diseases.    Cologuard screen was negative.  She wants to discuss her medications.    She accidentally took a gabapentin this morning and it made her anxiety worse--clinic nurse went to her home and reviewed her meds.  She did not note any error.    Says she had 4 wks of depression.  She's grieving watching Timmothy Sours "go to hell".  He's "doing better than I am right now"--working out on the machines.  The thought of not having him around kills her.  She has help 7-9 or 10 and at night 7-9 also.  Most of them are really good and give Eriko a break.    They went to the concern last weekend, but one of the boys has to help him.  Last night she had to feed him.  Says she has no reason to go on living, but she would not do anything to harm herself or others.  Has things in her past that she's had to deal with that come back up when she's depressed.  She's hoping the sertraline 188m daily will help.    Her endocrine appt is next week.  CBGs 140s-150s.    Sees Dr. PCasimiro Needleagain next week.  On lorazepam 0.551mone tab in am, 2 at noon and one at night and newly the sertraline 10081mp from 66m67mHad her booster, flu shot and had tetanus  shot.  Has a grandson who refuses to have the vaccine--he's got DMI.  This brings her to tears.  His wife is into only homeopathic.  Past Medical History:  Diagnosis Date  . Allergic rhinitis   . Allergy   . Anemia    as child   . Anxiety   . Arthritis   . Atrophic vaginitis 2017  . Cancer (HCC)[place] MOHS nose- skin cancer- basal cell   . Cataract    removed both eyes   . Depression   . Diabetes mellitus without complication (HCC)[place]. GERD (gastroesophageal reflux disease)   . Heart murmur    as child- outgrown   . Hematuria 05/09/2015   had two nonobstructing renal calculi  . Hepatitis A 1970s  . Hyperlipidemia   . Hypertension   . Osteopenia   . PONV (postoperative nausea and vomiting)   . Shingles 06/04/2015  . SUI (stress urinary incontinence, female) 2017  . Thrombocytopenia (HCC)[place]  Past Surgical History:  Procedure Laterality Date  . BACK SURGERY  11/2018  . CATARACT EXTRACTION W/ INTRAOCULAR LENS  IMPLANT, BILATERAL Bilateral   . COLONOSCOPY    . KNEE ARTHROSCOPY  2004, 1990s  . TONSILLECTOMY AND ADENOIDECTOMY  1952    Allergies  Allergen Reactions  . Keflex [Cephalexin]     Outpatient Encounter Medications as of 02/28/2020  Medication Sig  . acetaminophen (TYLENOL) 500 MG tablet Take 1 tablet (500 mg total) by mouth every 6 (six) hours as needed.  Marland Kitchen atorvastatin (LIPITOR) 20 MG tablet TAKE 1 TABLET BY MOUTH  DAILY  . Cholecalciferol (DIALYVITE VITAMIN D 5000 PO) Take by mouth.  . dapagliflozin propanediol (FARXIGA) 5 MG TABS tablet Take 1 tablet (5 mg total) by mouth daily before breakfast.  . esomeprazole (NEXIUM) 20 MG capsule Take 20 mg by mouth daily at 12 noon. otc  . glucose blood (ONETOUCH VERIO) test strip Use to test blood sugar daily. Dx: E11.9  . ketoconazole (NIZORAL) 2 % cream Apply 1 application topically 2 (two) times daily as needed (groin rash).  Marland Kitchen losartan (COZAAR) 100 MG tablet Take 100 mg by mouth daily.  . Magnesium 400 MG CAPS  Take by mouth.  . metFORMIN (GLUCOPHAGE) 1000 MG tablet TAKE 1 TABLET BY MOUTH TWO  TIMES DAILY WITH A MEAL  . Multiple Vitamins-Minerals (WOMENS 50+ MULTI VITAMIN/MIN PO) Take by mouth.  . vitamin k 100 MCG tablet Take 100 mcg by mouth daily.  . [DISCONTINUED] gabapentin (NEURONTIN) 100 MG capsule Take 1 capsule (100 mg total) by mouth 3 (three) times daily.  . [DISCONTINUED] PEG-KCl-NaCl-NaSulf-Na Asc-C (PLENVU) 140 g SOLR Take 1 kit by mouth as directed. Pt has a manufacturers medicare coupon to activate and bring  . [DISCONTINUED] vortioxetine HBr (TRINTELLIX) 10 MG TABS tablet Take 1 tablet (10 mg total) by mouth daily.   No facility-administered encounter medications on file as of 02/28/2020.    Review of Systems:  Review of Systems  Constitutional: Positive for malaise/fatigue. Negative for chills and fever.  HENT: Negative for congestion and sore throat.   Eyes: Negative for blurred vision.  Respiratory: Negative for cough and shortness of breath.   Cardiovascular: Negative for chest pain, palpitations and leg swelling.  Gastrointestinal: Negative for abdominal pain, blood in stool, constipation and melena.       Was getting nauseous with sertraline and taking promethazine with it but she is going to stop that now b/c no longer nauseous  Genitourinary: Negative for dysuria.  Musculoskeletal: Negative for falls and joint pain.  Neurological: Negative for dizziness and loss of consciousness.  Psychiatric/Behavioral: Positive for depression. Negative for memory loss and suicidal ideas. The patient is nervous/anxious. The patient does not have insomnia.     Health Maintenance  Topic Date Due  . HEMOGLOBIN A1C  01/10/2020  . FOOT EXAM  04/16/2020  . OPHTHALMOLOGY EXAM  07/04/2020  . TETANUS/TDAP  11/29/2029  . INFLUENZA VACCINE  Completed  . DEXA SCAN  Completed  . COVID-19 Vaccine  Completed  . Hepatitis C Screening  Completed  . PNA vac Low Risk Adult  Completed     Physical Exam: Vitals:   02/28/20 1327  BP: 138/82  Pulse: 100  Temp: 97.7 F (36.5 C)  TempSrc: Temporal  SpO2: 97%  Weight: 123 lb (55.8 kg)  Height: 5\' 1"  (1.549 m)   Body mass index is 23.24 kg/m. Physical Exam Vitals reviewed.  Constitutional:      General: She is not in acute distress.    Appearance: Normal appearance. She is not toxic-appearing.  HENT:     Head: Normocephalic and atraumatic.  Cardiovascular:     Rate and Rhythm: Normal rate and regular rhythm.     Pulses: Normal pulses.  Heart sounds: Normal heart sounds.  Pulmonary:     Effort: Pulmonary effort is normal.     Breath sounds: Normal breath sounds. No wheezing, rhonchi or rales.  Musculoskeletal:        General: Normal range of motion.     Right lower leg: No edema.     Left lower leg: No edema.  Skin:    General: Skin is warm and dry.  Neurological:     General: No focal deficit present.     Mental Status: She is alert and oriented to person, place, and time.     Motor: No weakness.     Gait: Gait normal.  Psychiatric:     Comments: Cried through most of appt; anxious tremulous, but less disorganized than she was last few visits     Labs reviewed: Basic Metabolic Panel: Recent Labs    09/14/19 0000  NA 140  K 4.9  CL 106  CO2 21  BUN 24*  CREATININE 0.8  CALCIUM 10.2   Liver Function Tests: No results for input(s): AST, ALT, ALKPHOS, BILITOT, PROT, ALBUMIN in the last 8760 hours. No results for input(s): LIPASE, AMYLASE in the last 8760 hours. No results for input(s): AMMONIA in the last 8760 hours. CBC: Recent Labs    09/14/19 0000  WBC 8.9  HGB 13.2  HCT 40  PLT 450*   Lipid Panel: Recent Labs    09/14/19 0000  CHOL 151  HDL 65  LDLCALC 64  TRIG 138   Lab Results  Component Value Date   HGBA1C 7.7 07/11/2019    Procedures since last visit: No results found.  Assessment/Plan 1. Type 2 diabetes mellitus without complication, without long-term  current use of insulin (HCC) - cont metformin and farxiga - dapagliflozin propanediol (FARXIGA) 5 MG TABS tablet; Take 1 tablet (5 mg total) by mouth daily before breakfast.  Dispense: 90 tablet; Refill: 3 -need hba1c from endocrinology when she goes next week  2. Caregiver stress syndrome -continues, seeing Dr. Casimiro Needle and Abagail Kitchens from [facility] for counseling  3. GAD (generalized anxiety disorder) -cont lorazepam and increased sertraline, cont counseling -recommended she think about what thinks bring her pleasure independently at times  4. Severe episode of recurrent major depressive disorder, without psychotic features ([place]) -cont sertraline  Labs/tests ordered:  Need hba1c after her endocrine visit  Next appt:  06/05/2020  Dajae Kizer L. Reatha Sur, D.O. [facility] [address], [place] [address] Cell Phone (Mon-Fri 8am-5pm):  [phone] On Call:  [phone] & follow prompts after 5pm & weekends Office Phone:  [phone] Office Fax:  [phone]

## 2020-03-06 DIAGNOSIS — E782 Mixed hyperlipidemia: Secondary | ICD-10-CM | POA: Diagnosis not present

## 2020-03-06 DIAGNOSIS — I10 Essential (primary) hypertension: Secondary | ICD-10-CM | POA: Diagnosis not present

## 2020-03-06 DIAGNOSIS — E119 Type 2 diabetes mellitus without complications: Secondary | ICD-10-CM | POA: Diagnosis not present

## 2020-03-06 DIAGNOSIS — R000 Tachycardia, unspecified: Secondary | ICD-10-CM | POA: Diagnosis not present

## 2020-03-06 LAB — HEMOGLOBIN A1C: Hemoglobin A1C: 7.8

## 2020-03-07 ENCOUNTER — Telehealth: Payer: Self-pay | Admitting: *Deleted

## 2020-03-07 LAB — HM DEXA SCAN

## 2020-03-07 NOTE — Telephone Encounter (Signed)
Dr. Casimiro Needle called requesting a copy of patient's last EKG to be faxed to him at [phone]. Printed and faxed.   OV Note Dated 02/28/20:  Sees Dr. Casimiro Needle again next week.  On lorazepam 0.5mg  one tab in am, 2 at noon and one at night and newly the sertraline 100mg  up from 50mg .

## 2020-03-14 ENCOUNTER — Telehealth: Payer: Self-pay

## 2020-03-14 NOTE — Telephone Encounter (Signed)
-----   Message from Lauree Chandler, NP sent at 03/14/2020 10:41 AM EST ----- Needs AWV (last 2019)

## 2020-03-14 NOTE — Telephone Encounter (Signed)
Left message to call office to schedule AWV.

## 2020-04-15 ENCOUNTER — Telehealth: Payer: Self-pay | Admitting: Family Medicine

## 2020-04-15 DIAGNOSIS — M1711 Unilateral primary osteoarthritis, right knee: Secondary | ICD-10-CM

## 2020-04-15 NOTE — Telephone Encounter (Signed)
I called and left this info on the patient's home voice mail - she may call back to set up an appointment for a cortisone injection in the knee.

## 2020-04-15 NOTE — Telephone Encounter (Signed)
Requesting approval for gel injections for knee OA.

## 2020-04-15 NOTE — Telephone Encounter (Signed)
Please advise. The patient is not due for another gel injection yet - had one on 11/21/19. Per the last ov note, she had had 3 cortisone injections last year, prior to her August ov.

## 2020-04-15 NOTE — Telephone Encounter (Signed)
Gel injections requested for February.  Could do cortisone before that if needed.

## 2020-04-15 NOTE — Telephone Encounter (Signed)
PT called and wants another injection and she can barley walk. Please call her [phone]

## 2020-04-16 NOTE — Telephone Encounter (Signed)
Noted  

## 2020-04-18 ENCOUNTER — Encounter: Payer: Self-pay | Admitting: Internal Medicine

## 2020-04-18 NOTE — Telephone Encounter (Signed)
Routed to Dr. Reed DO 

## 2020-04-23 ENCOUNTER — Telehealth: Payer: Self-pay | Admitting: Family Medicine

## 2020-04-23 ENCOUNTER — Other Ambulatory Visit: Payer: Self-pay

## 2020-04-23 ENCOUNTER — Ambulatory Visit (INDEPENDENT_AMBULATORY_CARE_PROVIDER_SITE_OTHER): Payer: Medicare Other | Admitting: Family Medicine

## 2020-04-23 ENCOUNTER — Encounter: Payer: Self-pay | Admitting: Family Medicine

## 2020-04-23 DIAGNOSIS — M1712 Unilateral primary osteoarthritis, left knee: Secondary | ICD-10-CM

## 2020-04-23 DIAGNOSIS — M1711 Unilateral primary osteoarthritis, right knee: Secondary | ICD-10-CM | POA: Diagnosis not present

## 2020-04-23 NOTE — Progress Notes (Signed)
Office Visit Note   Patient: Kimberly [name]           Date of Birth: 07-05-1945           MRN: [id number] Visit Date: 04/23/2020 Requested by: Gayland Curry, DO [place],  [place] [address] PCP: Gayland Curry, DO  Subjective: Chief Complaint  Patient presents with  . Left Knee - Pain    Swelling in the knee x 1 month. Tried wearing a ACE wrap, but it made her lower leg and foot swell.    HPI: She is here for left knee pain.  History of osteoarthritis.  She would like gel injections but we cannot do those until the end of February.  She has diabetes and is trying to watch her diet.  Of note, she is upset today because her husband who has Parkinson's is having to go to a full care facility.               ROS:   All other systems were reviewed and are negative.  Objective: Vital Signs: There were no vitals taken for this visit.  Physical Exam:  General:  Alert and oriented, in no acute distress. Pulm:  Breathing unlabored. Psy:  Normal mood, congruent affect. Skin: No erythema or warmth Left knee: 1+ effusion.  Full range of motion.  Tender on the joint lines.    Imaging: No results found.  Assessment & Plan: 1. left knee osteoarthritis -Steroid injection today.  Gel injection approval requested.  Follow-up as needed.     Procedures: Left knee steroid injection: After sterile prep with Betadine, injected 3 cc 0.25% bupivacaine and 6 mg betamethasone from superolateral approach, a flash of clear yellow synovial fluid was obtained prior to injection.       PMFS History: Patient Active Problem List   Diagnosis Date Noted  . Depression, major, single episode, severe ([place]) 01/03/2020  . GAD (generalized anxiety disorder) 01/03/2020  . Chronic right-sided low back pain with right-sided sciatica 07/21/2018  . Cervical radiculopathy 07/21/2018  . 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 Uncontrolled hypertension 11/22/2016  . Adjustment disorder with  anxious mood 02/04/2016  . Hyperlipidemia associated with type 2 diabetes mellitus ([place]) 02/04/2016  . Type 2 diabetes mellitus without complication, without long-term current use of insulin ([place]) 02/04/2016  . Basal cell carcinoma of left nasal sidewall 10/22/2011   Past Medical History:  Diagnosis Date  . Allergic rhinitis   . Allergy   . Anemia    as child   . Anxiety   . Arthritis   . Atrophic vaginitis 2017  . Cancer ([place])    MOHS nose- skin cancer- basal cell   . Cataract    removed both eyes   . Depression   . Diabetes mellitus without complication ([place])   . GERD (gastroesophageal reflux disease)   . Heart murmur    as child- outgrown   . Hematuria 05/09/2015   had two nonobstructing renal calculi  . Hepatitis A 1970s  . Hyperlipidemia   . Hypertension   . Osteopenia   . PONV (postoperative nausea and vomiting)   . Shingles 06/04/2015  . SUI (stress urinary incontinence, female) 2017  . Thrombocytopenia ([place])     Family History  Problem Relation Age of Onset  . Heart disease Father        heart attack  1980  . Stroke Father        min- stroke  62  .  Colon polyps Sister   . Cancer Brother        colon (2004)  . Colon cancer Brother   . Esophageal cancer Neg Hx   . Rectal cancer Neg Hx   . Stomach cancer Neg Hx     Past Surgical History:  Procedure Laterality Date  . BACK SURGERY  11/2018  . CATARACT EXTRACTION W/ INTRAOCULAR LENS  IMPLANT, BILATERAL Bilateral   . COLONOSCOPY    . KNEE ARTHROSCOPY  2004, 1990s  . TONSILLECTOMY AND ADENOIDECTOMY  1952   Social History   Occupational History  . Not on file  Tobacco Use  . Smoking status: Never Smoker  . Smokeless tobacco: Never Used  Substance and Sexual Activity  . Alcohol use: Never  . Drug use: Not on file  . Sexual activity: Not on file

## 2020-04-23 NOTE — Telephone Encounter (Signed)
Requesting gel injection for right knee OA as well as for left.

## 2020-04-24 NOTE — Telephone Encounter (Signed)
Noted  

## 2020-04-30 ENCOUNTER — Telehealth: Payer: Self-pay

## 2020-04-30 ENCOUNTER — Encounter: Payer: Self-pay | Admitting: Family Medicine

## 2020-04-30 NOTE — Telephone Encounter (Signed)
The patient also sent a message through [place] - Dr. Junius Roads has already responded to this.

## 2020-04-30 NOTE — Telephone Encounter (Signed)
Pt called stating she got an injection in her left knee last week.  Pt said she felt great the day after now she cant walk she would like to know if this is normal.

## 2020-05-03 ENCOUNTER — Telehealth: Payer: Self-pay

## 2020-05-03 NOTE — Telephone Encounter (Signed)
Submitted VOB for SynviscOne, right knee.  Next available appointment for next gel injection would need to be after 05/23/2020 due to receiving last injection on 11/21/2019.

## 2020-05-03 NOTE — Telephone Encounter (Signed)
Patient called she wants to get another gel injection she wants to get pre approval process started. [phone]

## 2020-05-03 NOTE — Telephone Encounter (Signed)
It looks like this process has been started on both her knees. Would you mind giving the patient an update on this?

## 2020-05-03 NOTE — Telephone Encounter (Signed)
Talked with patient concerning gel injection.  

## 2020-05-06 ENCOUNTER — Telehealth: Payer: Self-pay

## 2020-05-06 NOTE — Telephone Encounter (Signed)
Approved, SynviscOne, right knee. [place] Patient will be responsible for 20% OOP. Co-pay of $35.00 No PA required  Appt. 05/24/2020 with Dr. Junius Roads.

## 2020-05-08 ENCOUNTER — Telehealth: Payer: Self-pay

## 2020-05-08 NOTE — Telephone Encounter (Signed)
Submitted VOB, SYnviscOne, right knee.

## 2020-05-13 ENCOUNTER — Telehealth: Payer: Self-pay | Admitting: Internal Medicine

## 2020-05-13 NOTE — Telephone Encounter (Signed)
Confirmed with patient and Heather(clinic nurse)  to have labs drawn on Thursday 05/16/2020 @ 8 am.

## 2020-05-13 NOTE — Telephone Encounter (Signed)
Fine for her to get cbc, bmp, hba1c, flp Thursday morning due to diabetes II

## 2020-05-13 NOTE — Telephone Encounter (Signed)
Ms Desouza called requesting order for labs A1c to be done this week at [facility] or [facility] bc she is scheduled for an injection next week.  Please advise where this is to be done & if Dr Mariea Clonts will approve labs with injection.  Thanks, Vilinda Blanks

## 2020-05-13 NOTE — Telephone Encounter (Signed)
Patient returned phone call from [facility] regarding labs.  Please give patient a call.  Thank you   [employer]

## 2020-05-13 NOTE — Telephone Encounter (Signed)
Please call patient at [phone] regarding labs.  Thank you  [employer]

## 2020-05-16 ENCOUNTER — Encounter: Payer: Self-pay | Admitting: Internal Medicine

## 2020-05-16 DIAGNOSIS — E119 Type 2 diabetes mellitus without complications: Secondary | ICD-10-CM | POA: Diagnosis not present

## 2020-05-16 DIAGNOSIS — I10 Essential (primary) hypertension: Secondary | ICD-10-CM | POA: Diagnosis not present

## 2020-05-16 DIAGNOSIS — E785 Hyperlipidemia, unspecified: Secondary | ICD-10-CM | POA: Diagnosis not present

## 2020-05-16 LAB — HEMOGLOBIN A1C: Hemoglobin A1C: 8.2

## 2020-05-16 LAB — LIPID PANEL
Cholesterol: 130 (ref 0–200)
HDL: 56 (ref 35–70)
LDL Cholesterol: 48
Triglycerides: 129 (ref 40–160)

## 2020-05-16 LAB — CBC AND DIFFERENTIAL
HCT: 36 (ref 36–46)
Hemoglobin: 12.6 (ref 12.0–16.0)
Platelets: 533 — AB (ref 150–399)
WBC: 8

## 2020-05-16 LAB — BASIC METABOLIC PANEL
BUN: 19 (ref 4–21)
CO2: 104 — AB (ref 13–22)
Chloride: 104 (ref 99–108)
Creatinine: 0.7 (ref 0.5–1.1)
Glucose: 159
Potassium: 4.3 (ref 3.4–5.3)
Sodium: 141 (ref 137–147)

## 2020-05-16 LAB — CBC: RBC: 4.18 (ref 3.87–5.11)

## 2020-05-16 LAB — COMPREHENSIVE METABOLIC PANEL: Calcium: 9.1 (ref 8.7–10.7)

## 2020-05-17 ENCOUNTER — Telehealth: Payer: Self-pay

## 2020-05-17 ENCOUNTER — Other Ambulatory Visit: Payer: Self-pay | Admitting: Orthopedic Surgery

## 2020-05-17 DIAGNOSIS — E119 Type 2 diabetes mellitus without complications: Secondary | ICD-10-CM

## 2020-05-17 NOTE — Telephone Encounter (Signed)
Kimberly [name] has called and would like the results of her A1C. She stated that Dr. Mariea Clonts called her to tell her but she was out doing other things. Labs have been abstracted for you to view and advise thank you.

## 2020-05-17 NOTE — Progress Notes (Signed)
a1c

## 2020-05-20 DIAGNOSIS — M5013 Cervical disc disorder with radiculopathy, cervicothoracic region: Secondary | ICD-10-CM | POA: Diagnosis not present

## 2020-05-20 DIAGNOSIS — M5412 Radiculopathy, cervical region: Secondary | ICD-10-CM | POA: Diagnosis not present

## 2020-05-20 DIAGNOSIS — S46091A Other injury of muscle(s) and tendon(s) of the rotator cuff of right shoulder, initial encounter: Secondary | ICD-10-CM | POA: Diagnosis not present

## 2020-05-24 ENCOUNTER — Encounter: Payer: Self-pay | Admitting: Family Medicine

## 2020-05-24 ENCOUNTER — Other Ambulatory Visit: Payer: Self-pay

## 2020-05-24 ENCOUNTER — Ambulatory Visit: Payer: Medicare Other | Admitting: Family Medicine

## 2020-05-24 DIAGNOSIS — M1712 Unilateral primary osteoarthritis, left knee: Secondary | ICD-10-CM | POA: Diagnosis not present

## 2020-05-24 NOTE — Progress Notes (Signed)
Subjective: She is here for planned Synvisc 1 injection for left knee osteoarthritis.  Objective: Trace effusion with no warmth or erythema.  Procedure: Left knee injection: After sterile prep with Betadine, injected 3 cc 0.25% bupivacaine and Synvisc-1 from superolateral approach, a flash of clear yellow synovial fluid was obtained prior to injection.

## 2020-05-27 ENCOUNTER — Telehealth: Payer: Self-pay | Admitting: Family Medicine

## 2020-05-27 ENCOUNTER — Other Ambulatory Visit: Payer: Self-pay | Admitting: Family Medicine

## 2020-05-27 ENCOUNTER — Encounter: Payer: Self-pay | Admitting: Internal Medicine

## 2020-05-27 MED ORDER — TRAMADOL HCL 50 MG PO TABS
50.0000 mg | ORAL_TABLET | Freq: Four times a day (QID) | ORAL | 0 refills | Status: DC | PRN
Start: 2020-05-27 — End: 2020-10-02

## 2020-05-27 NOTE — Telephone Encounter (Signed)
I called to advised the patient of the Tramadol Rx --- she has already retrieved this from the pharmacy. She has an appointment for follow up on 05/31/20 and possible dextrose injection.

## 2020-05-27 NOTE — Telephone Encounter (Signed)
It can take a few weeks for the gel injection to work.  It's too soon to call it a failure.  I will call in tramadol for pain.

## 2020-05-27 NOTE — Telephone Encounter (Signed)
Pt called and said injection didn't work and is in a lot of pain. Wants to talk about what else she could do for the pain CB [phone]

## 2020-05-27 NOTE — Telephone Encounter (Signed)
She just had a gel injection on Friday. Please advise on pain relief options.

## 2020-05-28 DIAGNOSIS — Z79899 Other long term (current) drug therapy: Secondary | ICD-10-CM | POA: Diagnosis not present

## 2020-05-31 ENCOUNTER — Other Ambulatory Visit: Payer: Self-pay

## 2020-05-31 ENCOUNTER — Encounter: Payer: Self-pay | Admitting: Family Medicine

## 2020-05-31 ENCOUNTER — Ambulatory Visit (INDEPENDENT_AMBULATORY_CARE_PROVIDER_SITE_OTHER): Payer: Medicare Other | Admitting: Family Medicine

## 2020-05-31 DIAGNOSIS — M1712 Unilateral primary osteoarthritis, left knee: Secondary | ICD-10-CM | POA: Diagnosis not present

## 2020-05-31 NOTE — Progress Notes (Signed)
Office Visit Note   Patient: Kimberly [name]           Date of Birth: 22-May-1945           MRN: [id number] Visit Date: 05/31/2020 Requested by: Gayland Curry, DO [place],  [place] [address] PCP: Gayland Curry, DO  Subjective: Chief Complaint  Patient presents with  . Left Knee - Pain, Follow-up    Continues to have severe pain in the knee. Has felt no relief at all from the gel injection. The Tramadol does help her sleep at night.     HPI: She is here with persistent left knee pain.  Gel injection helped a little bit with the swelling but she is still having severe pain on a daily basis.               ROS:   All other systems were reviewed and are negative.  Objective: Vital Signs: There were no vitals taken for this visit.  Physical Exam:  General:  Alert and oriented, in no acute distress. Pulm:  Breathing unlabored. Psy:  Normal mood, congruent affect. Skin: No erythema Left knee: 1+ effusion with no warmth.  Good range of motion of the knee, slight pain with patella compression.  Slight tenderness on the medial joint line.  No fullness in the popliteal fossa.  She does have some groin pain with passive internal hip rotation, but it seems to be separate from the pain she is experiencing in the knee.  Imaging: No results found.  Assessment & Plan: 1.  Persistent left knee pain, suspect due to DJD but could be referred pain. -Discussed with her, she would like to try a dextrose injection today.  If this does not help, consider MRI of the knee.     Procedures: After sterile prep with Betadine, injected 6 cc of 0.25% bupivacaine without epinephrine and 4 cc 50% dextrose from superolateral approach into the left knee.       PMFS History: Patient Active Problem List   Diagnosis Date Noted  . Depression, major, single episode, severe ([place]) 01/03/2020  . GAD (generalized anxiety disorder) 01/03/2020  . Chronic right-sided low back pain with right-sided  sciatica 07/21/2018  . Cervical radiculopathy 07/21/2018  . 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 Uncontrolled hypertension 11/22/2016  . Adjustment disorder with anxious mood 02/04/2016  . Hyperlipidemia associated with type 2 diabetes mellitus ([place]) 02/04/2016  . Type 2 diabetes mellitus without complication, without long-term current use of insulin ([place]) 02/04/2016  . Basal cell carcinoma of left nasal sidewall 10/22/2011   Past Medical History:  Diagnosis Date  . Allergic rhinitis   . Allergy   . Anemia    as child   . Anxiety   . Arthritis   . Atrophic vaginitis 2017  . Cancer ([place])    MOHS nose- skin cancer- basal cell   . Cataract    removed both eyes   . Depression   . Diabetes mellitus without complication ([place])   . GERD (gastroesophageal reflux disease)   . Heart murmur    as child- outgrown   . Hematuria 05/09/2015   had two nonobstructing renal calculi  . Hepatitis A 1970s  . Hyperlipidemia   . Hypertension   . Osteopenia   . PONV (postoperative nausea and vomiting)   . Shingles 06/04/2015  . SUI (stress urinary incontinence, female) 2017  . Thrombocytopenia ([place])     Family History  Problem Relation Age of  Onset  . Heart disease Father        heart attack  1980  . Stroke Father        min- stroke  65  . Colon polyps Sister   . Cancer Brother        colon (2004)  . Colon cancer Brother   . Esophageal cancer Neg Hx   . Rectal cancer Neg Hx   . Stomach cancer Neg Hx     Past Surgical History:  Procedure Laterality Date  . BACK SURGERY  11/2018  . CATARACT EXTRACTION W/ INTRAOCULAR LENS  IMPLANT, BILATERAL Bilateral   . COLONOSCOPY    . KNEE ARTHROSCOPY  2004, 1990s  . TONSILLECTOMY AND ADENOIDECTOMY  1952   Social History   Occupational History  . Not on file  Tobacco Use  . Smoking status: Never Smoker  . Smokeless tobacco: Never Used  Substance and Sexual Activity  . Alcohol use: Never  . Drug use: Not on file  . Sexual activity:  Not on file

## 2020-06-05 ENCOUNTER — Non-Acute Institutional Stay: Payer: Medicare Other | Admitting: Internal Medicine

## 2020-06-05 ENCOUNTER — Other Ambulatory Visit: Payer: Self-pay

## 2020-06-05 ENCOUNTER — Encounter: Payer: Self-pay | Admitting: Internal Medicine

## 2020-06-05 VITALS — BP 118/76 | HR 105 | Temp 97.9°F | Ht 61.0 in | Wt 113.8 lb

## 2020-06-05 DIAGNOSIS — F411 Generalized anxiety disorder: Secondary | ICD-10-CM

## 2020-06-05 DIAGNOSIS — E119 Type 2 diabetes mellitus without complications: Secondary | ICD-10-CM

## 2020-06-05 DIAGNOSIS — E1142 Type 2 diabetes mellitus with diabetic polyneuropathy: Secondary | ICD-10-CM | POA: Diagnosis not present

## 2020-06-05 DIAGNOSIS — R000 Tachycardia, unspecified: Secondary | ICD-10-CM | POA: Diagnosis not present

## 2020-06-05 DIAGNOSIS — R457 State of emotional shock and stress, unspecified: Secondary | ICD-10-CM

## 2020-06-05 DIAGNOSIS — F332 Major depressive disorder, recurrent severe without psychotic features: Secondary | ICD-10-CM | POA: Diagnosis not present

## 2020-06-05 DIAGNOSIS — M5412 Radiculopathy, cervical region: Secondary | ICD-10-CM

## 2020-06-05 DIAGNOSIS — F4389 Other reactions to severe stress: Secondary | ICD-10-CM

## 2020-06-05 MED ORDER — LORAZEPAM 0.5 MG PO TABS: ORAL_TABLET | ORAL | 1 refills | Status: DC

## 2020-06-05 NOTE — Progress Notes (Signed)
Location:  [facility] of Service:  Clinic (12)  Provider: Daeron Carreno L. Mariea Clonts, D.O., C.M.D.   Goals of Care:  Advanced Directives 02/28/2020  Does Patient Have a Medical Advance Directive? Yes  Type of Advance Directive [place]  Does patient want to make changes to medical advance directive? No - Patient declined  Copy of [place] in Chart? Yes - validated most recent copy scanned in chart (See row information)  Pre-existing out of facility DNR order (yellow form or pink MOST form) -     Chief Complaint  Patient presents with  . Medical Management of Chronic Issues    Medical Management of Chronic Issues. 3 Month Follow up. Patient fell this morning and hurt Left Knee alittle bit.     HPI: Patient is a 75 y.o. female seen today for medical management of chronic diseases.    She has lost "30 lbs" with her diet and some unintentionally due to her depression about Timmothy Sours moving into health care.  She cannot get through this.  Dr. Casimiro Needle has changed her meds several times.  She's also seeing a therapist there--says she is good, but she's not feeling better.  She remains so depressed and anxious and cannot get through the day.  She thinks about being alone one day and how scary that is for her.  She feels like there is a boulder in her chest. She is sleeping with all of the medications and wakes up feeling pretty good.  During the day, she's depressed and crying all of the time and the same if she wakes up from a nap.  She knows she can't take care of Don--she wants to but cannot.  It kills her to watch him go downhill like this.    Today, she missed a step and fell in the parking lot, twisting her left ankle a little.    hba1c was 8.2 on 2/10.    She is using a pillbox.  She has  7 in am, 3 in am, midafternoon and night.    She does not have a desire to do anything like play the piano.  Reminded her that it takes 4-6  wks for the medication from Dr. Casimiro Needle to work.      Past Medical History:  Diagnosis Date  . Allergic rhinitis   . Allergy   . Anemia    as child   . Anxiety   . Arthritis   . Atrophic vaginitis 2017  . Cancer ([place])    MOHS nose- skin cancer- basal cell   . Cataract    removed both eyes   . Depression   . Diabetes mellitus without complication ([place])   . GERD (gastroesophageal reflux disease)   . Heart murmur    as child- outgrown   . Hematuria 05/09/2015   had two nonobstructing renal calculi  . Hepatitis A 1970s  . Hyperlipidemia   . Hypertension   . Osteopenia   . PONV (postoperative nausea and vomiting)   . Shingles 06/04/2015  . SUI (stress urinary incontinence, female) 2017  . Thrombocytopenia ([place])     Past Surgical History:  Procedure Laterality Date  . BACK SURGERY  11/2018  . CATARACT EXTRACTION W/ INTRAOCULAR LENS  IMPLANT, BILATERAL Bilateral   . COLONOSCOPY    . KNEE ARTHROSCOPY  2004, 1990s  . TONSILLECTOMY AND ADENOIDECTOMY  1952    Allergies  Allergen Reactions  . Keflex [Cephalexin]  Outpatient Encounter Medications as of 06/05/2020  Medication Sig  . acetaminophen (TYLENOL) 500 MG tablet Take 1 tablet (500 mg total) by mouth every 6 (six) hours as needed.  . ALPRAZolam (XANAX) 0.5 MG tablet Take by mouth.  Marland Kitchen atorvastatin (LIPITOR) 20 MG tablet TAKE 1 TABLET BY MOUTH  DAILY  . cholecalciferol (VITAMIN D3) 25 MCG (1000 UNIT) tablet Take 1,000 Units by mouth 2 (two) times daily.  . dapagliflozin propanediol (FARXIGA) 5 MG TABS tablet Take 1 tablet (5 mg total) by mouth daily before breakfast.  . glucose blood (ONETOUCH VERIO) test strip Use to test blood sugar daily. Dx: E11.9  . ketoconazole (NIZORAL) 2 % cream Apply 1 application topically 2 (two) times daily as needed (groin rash).  . LORazepam (ATIVAN) 0.5 MG tablet 1 tab q am, 2 tabs at noon and 1 tab qhs  . losartan (COZAAR) 100 MG tablet Take 100 mg by mouth daily.  . Magnesium 400 MG  CAPS Take by mouth.  . metFORMIN (GLUCOPHAGE) 1000 MG tablet TAKE 1 TABLET BY MOUTH TWO  TIMES DAILY WITH A MEAL  . Multiple Vitamins-Minerals (WOMENS 50+ MULTI VITAMIN/MIN PO) Take by mouth.  . nortriptyline (PAMELOR) 25 MG capsule Take 50 mg by mouth at bedtime.  Marland Kitchen omeprazole (PRILOSEC) 20 MG capsule Take 20 mg by mouth daily.  . promethazine (PHENERGAN) 25 MG tablet Take 25 mg by mouth 2 (two) times daily.  . traMADol (ULTRAM) 50 MG tablet Take 1 tablet (50 mg total) by mouth every 6 (six) hours as needed.  . vitamin k 100 MCG tablet Take 100 mcg by mouth daily.   No facility-administered encounter medications on file as of 06/05/2020.    Review of Systems:  Review of Systems  Constitutional: Positive for malaise/fatigue and weight loss. Negative for chills and fever.       Temp earlier 85 with clinic nurse, now normal  HENT: Negative for congestion and sore throat.   Eyes: Negative for blurred vision.  Respiratory: Negative for cough and shortness of breath.   Cardiovascular: Negative for chest pain, palpitations and leg swelling.  Gastrointestinal: Negative for abdominal pain.  Genitourinary: Negative for dysuria, frequency and urgency.  Musculoskeletal: Positive for falls, joint pain and neck pain.       Mild left ankle pain with a touch of swelling  Neurological: Positive for tingling and sensory change. Negative for dizziness and loss of consciousness.  Psychiatric/Behavioral: Positive for depression. Negative for memory loss and suicidal ideas. The patient is nervous/anxious. The patient does not have insomnia.     Health Maintenance  Topic Date Due  . FOOT EXAM  04/16/2020  . OPHTHALMOLOGY EXAM  07/04/2020  . COVID-19 Vaccine (4 - Booster for Moderna series) 08/19/2020  . HEMOGLOBIN A1C  11/13/2020  . TETANUS/TDAP  11/29/2029  . INFLUENZA VACCINE  Completed  . DEXA SCAN  Completed  . Hepatitis C Screening  Completed  . PNA vac Low Risk Adult  Completed  . HPV VACCINES   Aged Out    Physical Exam: Vitals:   06/05/20 1519  BP: 118/76  Pulse: (!) 105  Temp: 97.9 F (36.6 C)  TempSrc: Skin  SpO2: 97%  Weight: 113 lb 12.8 oz (51.6 kg)  Height: 5\' 1"  (1.549 m)   Body mass index is 21.5 kg/m. Physical Exam Vitals reviewed.  Constitutional:      Appearance: Normal appearance. She is not toxic-appearing.  HENT:     Head: Normocephalic and atraumatic.  Eyes:  Conjunctiva/sclera: Conjunctivae normal.  Cardiovascular:     Rate and Rhythm: Regular rhythm. Tachycardia present.     Heart sounds: No murmur heard.   Pulmonary:     Effort: Pulmonary effort is normal.     Breath sounds: Normal breath sounds.  Abdominal:     General: Bowel sounds are normal.     Tenderness: There is no abdominal tenderness.  Musculoskeletal:        General: Normal range of motion.     Right lower leg: No edema.     Left lower leg: No edema.     Comments: Very minimal lateral tenderness of ankle and tiny bit of swelling around malleolus  Skin:    General: Skin is warm and dry.     Capillary Refill: Capillary refill takes more than 3 seconds. Skin tenting and decreased turgor, dark circles beneath eyes    Findings: No bruising.  Neurological:     General: No focal deficit present.     Mental Status: She is alert and oriented to person, place, and time.  Psychiatric:     Comments: Tearful most of visit     Labs reviewed: Basic Metabolic Panel: Recent Labs    09/14/19 0000 05/16/20 0000  NA 140 141  K 4.9 4.3  CL 106 104  CO2 21 104*  BUN 24* 19  CREATININE 0.8 0.7  CALCIUM 10.2 9.1   Liver Function Tests: No results for input(s): AST, ALT, ALKPHOS, BILITOT, PROT, ALBUMIN in the last 8760 hours. No results for input(s): LIPASE, AMYLASE in the last 8760 hours. No results for input(s): AMMONIA in the last 8760 hours. CBC: Recent Labs    09/14/19 0000 05/16/20 0000  WBC 8.9 8.0  HGB 13.2 12.6  HCT 40 36  PLT 450* 533*   Lipid Panel: Recent  Labs    09/14/19 0000 05/16/20 0000  CHOL 151 130  HDL 65 56  LDLCALC 64 48  TRIG 138 129   Lab Results  Component Value Date   HGBA1C 8.2 05/16/2020    Procedures since last visit: No results found.  Assessment/Plan 1. Type 2 diabetes mellitus without complication, without long-term current use of insulin (HCC) -hba1c above goal -I did not change meds b/c she's been eating less but not the right foods and not exercising so she needs to get back on track -cont metformin and farxiga  2. Caregiver stress syndrome Cont counseling and meds per Dr. Casimiro Needle Try going to exercise class and walking in the pool Glad she attended the caregiver support therapy session also  3. GAD (generalized anxiety disorder) -cont the lorazepam and xanax per psychiatry -she is still really struggling   4. Severe episode of recurrent major depressive disorder, without psychotic features ([place]) -cont nortriptyline and benzos per Dr. Casimiro Needle -she has not had good success with SSRIs, SNRIs or wellbutrin or trintellix that I've tried prior to referring to him  5. Type 2 diabetes mellitus with diabetic polyneuropathy, without long-term current use of insulin (HCC) -cont metformin and farxiga  6. Cervical radiculopathy -bothering her a bit more on the right side of her neck similar to prior to surgery -encouraged her to return to regular exercise to help with arthritis  7. Tachycardia Drink more water and less tea--looks like dehydration causing tachycardia based on skin turgor and cap refills, dark circles under eyes  Labs/tests ordered:   Lab Orders  No laboratory test(s) ordered today   Next appt:  4 mos med mgt [facility]  Gisela Lea L. Shulem Mader, D.O. [facility] [address],  [address] Cell Phone (Mon-Fri 8am-5pm):  [phone] On Call:  [phone] & follow prompts after 5pm & weekends Office Phone:  [phone] Office Fax:   [phone]

## 2020-06-07 DIAGNOSIS — M7551 Bursitis of right shoulder: Secondary | ICD-10-CM | POA: Diagnosis not present

## 2020-06-07 DIAGNOSIS — M4802 Spinal stenosis, cervical region: Secondary | ICD-10-CM | POA: Diagnosis not present

## 2020-06-07 DIAGNOSIS — M7552 Bursitis of left shoulder: Secondary | ICD-10-CM | POA: Diagnosis not present

## 2020-06-10 ENCOUNTER — Telehealth: Payer: Self-pay | Admitting: Nurse Practitioner

## 2020-06-10 ENCOUNTER — Encounter: Payer: Self-pay | Admitting: Internal Medicine

## 2020-06-10 NOTE — Telephone Encounter (Signed)
Called to schedule AWV. Patient stated everytime she's scheduled for one no one ever calls and she will call in to schedule herself.

## 2020-06-11 ENCOUNTER — Telehealth: Payer: Self-pay | Admitting: Family Medicine

## 2020-06-11 DIAGNOSIS — M1711 Unilateral primary osteoarthritis, right knee: Secondary | ICD-10-CM

## 2020-06-11 DIAGNOSIS — M1712 Unilateral primary osteoarthritis, left knee: Secondary | ICD-10-CM

## 2020-06-11 NOTE — Telephone Encounter (Signed)
Message given to Mordecai Maes to schedule AWV   Ma Hillock  06/10/20 11:53 AM Note Called to schedule AWV. Patient stated everytime she's scheduled for one no one ever calls and she will call in to schedule herself.

## 2020-06-11 NOTE — Telephone Encounter (Signed)
Left knee still causing severe pain.  Will order MRI.

## 2020-06-12 ENCOUNTER — Encounter: Payer: Self-pay | Admitting: Dermatology

## 2020-06-12 ENCOUNTER — Encounter: Payer: Medicare Other | Admitting: Nurse Practitioner

## 2020-06-12 ENCOUNTER — Ambulatory Visit: Payer: Medicare Other | Admitting: Dermatology

## 2020-06-12 ENCOUNTER — Other Ambulatory Visit: Payer: Self-pay

## 2020-06-12 DIAGNOSIS — L309 Dermatitis, unspecified: Secondary | ICD-10-CM | POA: Diagnosis not present

## 2020-06-12 MED ORDER — BETAMETHASONE DIPROPIONATE AUG 0.05 % EX OINT: TOPICAL_OINTMENT | CUTANEOUS | 1 refills | Status: DC

## 2020-06-12 NOTE — Patient Instructions (Signed)
Follow up via MyChart in 1 month.

## 2020-06-14 DIAGNOSIS — M7551 Bursitis of right shoulder: Secondary | ICD-10-CM | POA: Diagnosis not present

## 2020-06-14 DIAGNOSIS — M4722 Other spondylosis with radiculopathy, cervical region: Secondary | ICD-10-CM | POA: Diagnosis not present

## 2020-06-14 DIAGNOSIS — M5412 Radiculopathy, cervical region: Secondary | ICD-10-CM | POA: Diagnosis not present

## 2020-06-14 DIAGNOSIS — M7552 Bursitis of left shoulder: Secondary | ICD-10-CM | POA: Diagnosis not present

## 2020-06-17 ENCOUNTER — Encounter: Payer: Self-pay | Admitting: Nurse Practitioner

## 2020-06-17 ENCOUNTER — Ambulatory Visit (INDEPENDENT_AMBULATORY_CARE_PROVIDER_SITE_OTHER): Payer: Medicare Other | Admitting: Nurse Practitioner

## 2020-06-17 ENCOUNTER — Telehealth: Payer: Self-pay

## 2020-06-17 ENCOUNTER — Other Ambulatory Visit: Payer: Self-pay

## 2020-06-17 DIAGNOSIS — Z0000 Encounter for general adult medical examination without abnormal findings: Secondary | ICD-10-CM

## 2020-06-17 NOTE — Patient Instructions (Signed)
Kimberly [name] , Thank you for taking time to come for your Medicare Wellness Visit. I appreciate your ongoing commitment to your health goals. Please review the following plan we discussed and let me know if I can assist you in the future.   Screening recommendations/referrals: Colonoscopy aged out Mammogram up to date Bone Density up to date Recommended yearly ophthalmology/optometry visit for glaucoma screening and checkup Recommended yearly dental visit for hygiene and checkup  Vaccinations: Influenza vaccine up to date Pneumococcal vaccine  Up to date Tdap vaccine up to date  Shingles vaccine up to date     Advanced directives: on file.   Conditions/risks identified: depression, anxiety, advance age.   Next appointment: 1 year.    Preventive Care 1 Years and Older, Female Preventive care refers to lifestyle choices and visits with your health care provider that can promote health and wellness. What does preventive care include?  A yearly physical exam. This is also called an annual well check.  Dental exams once or twice a year.  Routine eye exams. Ask your health care provider how often you should have your eyes checked.  Personal lifestyle choices, including:  Daily care of your teeth and gums.  Regular physical activity.  Eating a healthy diet.  Avoiding tobacco and drug use.  Limiting alcohol use.  Practicing safe sex.  Taking low-dose aspirin every day.  Taking vitamin and mineral supplements as recommended by your health care provider. What happens during an annual well check? The services and screenings done by your health care provider during your annual well check will depend on your age, overall health, lifestyle risk factors, and family history of disease. Counseling  Your health care provider may ask you questions about your:  Alcohol use.  Tobacco use.  Drug use.  Emotional well-being.  Home and relationship well-being.  Sexual  activity.  Eating habits.  History of falls.  Memory and ability to understand (cognition).  Work and work Statistician.  Reproductive health. Screening  You may have the following tests or measurements:  Height, weight, and BMI.  Blood pressure.  Lipid and cholesterol levels. These may be checked every 5 years, or more frequently if you are over 26 years old.  Skin check.  Lung cancer screening. You may have this screening every year starting at age 65 if you have a 30-pack-year history of smoking and currently smoke or have quit within the past 15 years.  Fecal occult blood test (FOBT) of the stool. You may have this test every year starting at age 36.  Flexible sigmoidoscopy or colonoscopy. You may have a sigmoidoscopy every 5 years or a colonoscopy every 10 years starting at age 75.  Hepatitis C blood test.  Hepatitis B blood test.  Sexually transmitted disease (STD) testing.  Diabetes screening. This is done by checking your blood sugar (glucose) after you have not eaten for a while (fasting). You may have this done every 1-3 years.  Bone density scan. This is done to screen for osteoporosis. You may have this done starting at age 44.  Mammogram. This may be done every 1-2 years. Talk to your health care provider about how often you should have regular mammograms. Talk with your health care provider about your test results, treatment options, and if necessary, the need for more tests. Vaccines  Your health care provider may recommend certain vaccines, such as:  Influenza vaccine. This is recommended every year.  Tetanus, diphtheria, and acellular pertussis (Tdap, Td) vaccine. You may  need a Td booster every 10 years.  Zoster vaccine. You may need this after age 92.  Pneumococcal 13-valent conjugate (PCV13) vaccine. One dose is recommended after age 63.  Pneumococcal polysaccharide (PPSV23) vaccine. One dose is recommended after age 48. Talk to your health care  provider about which screenings and vaccines you need and how often you need them. This information is not intended to replace advice given to you by your health care provider. Make sure you discuss any questions you have with your health care provider. Document Released: 04/19/2015 Document Revised: 12/11/2015 Document Reviewed: 01/22/2015 Elsevier Interactive Patient Education  2017 [place] Prevention in the Home Falls can cause injuries. They can happen to people of all ages. There are many things you can do to make your home safe and to help prevent falls. What can I do on the outside of my home?  Regularly fix the edges of walkways and driveways and fix any cracks.  Remove anything that might make you trip as you walk through a door, such as a raised step or threshold.  Trim any bushes or trees on the path to your home.  Use bright outdoor lighting.  Clear any walking paths of anything that might make someone trip, such as rocks or tools.  Regularly check to see if handrails are loose or broken. Make sure that both sides of any steps have handrails.  Any raised decks and porches should have guardrails on the edges.  Have any leaves, snow, or ice cleared regularly.  Use sand or salt on walking paths during winter.  Clean up any spills in your garage right away. This includes oil or grease spills. What can I do in the bathroom?  Use night lights.  Install grab bars by the toilet and in the tub and shower. Do not use towel bars as grab bars.  Use non-skid mats or decals in the tub or shower.  If you need to sit down in the shower, use a plastic, non-slip stool.  Keep the floor dry. Clean up any water that spills on the floor as soon as it happens.  Remove soap buildup in the tub or shower regularly.  Attach bath mats securely with double-sided non-slip rug tape.  Do not have throw rugs and other things on the floor that can make you trip. What can I do in  the bedroom?  Use night lights.  Make sure that you have a light by your bed that is easy to reach.  Do not use any sheets or blankets that are too big for your bed. They should not hang down onto the floor.  Have a firm chair that has side arms. You can use this for support while you get dressed.  Do not have throw rugs and other things on the floor that can make you trip. What can I do in the kitchen?  Clean up any spills right away.  Avoid walking on wet floors.  Keep items that you use a lot in easy-to-reach places.  If you need to reach something above you, use a strong step stool that has a grab bar.  Keep electrical cords out of the way.  Do not use floor polish or wax that makes floors slippery. If you must use wax, use non-skid floor wax.  Do not have throw rugs and other things on the floor that can make you trip. What can I do with my stairs?  Do not leave any items on  the stairs.  Make sure that there are handrails on both sides of the stairs and use them. Fix handrails that are broken or loose. Make sure that handrails are as long as the stairways.  Check any carpeting to make sure that it is firmly attached to the stairs. Fix any carpet that is loose or worn.  Avoid having throw rugs at the top or bottom of the stairs. If you do have throw rugs, attach them to the floor with carpet tape.  Make sure that you have a light switch at the top of the stairs and the bottom of the stairs. If you do not have them, ask someone to add them for you. What else can I do to help prevent falls?  Wear shoes that:  Do not have high heels.  Have rubber bottoms.  Are comfortable and fit you well.  Are closed at the toe. Do not wear sandals.  If you use a stepladder:  Make sure that it is fully opened. Do not climb a closed stepladder.  Make sure that both sides of the stepladder are locked into place.  Ask someone to hold it for you, if possible.  Clearly mark and  make sure that you can see:  Any grab bars or handrails.  First and last steps.  Where the edge of each step is.  Use tools that help you move around (mobility aids) if they are needed. These include:  Canes.  Walkers.  Scooters.  Crutches.  Turn on the lights when you go into a dark area. Replace any light bulbs as soon as they burn out.  Set up your furniture so you have a clear path. Avoid moving your furniture around.  If any of your floors are uneven, fix them.  If there are any pets around you, be aware of where they are.  Review your medicines with your doctor. Some medicines can make you feel dizzy. This can increase your chance of falling. Ask your doctor what other things that you can do to help prevent falls. This information is not intended to replace advice given to you by your health care provider. Make sure you discuss any questions you have with your health care provider. Document Released: 01/17/2009 Document Revised: 08/29/2015 Document Reviewed: 04/27/2014 Elsevier Interactive Patient Education  2017 [employer].

## 2020-06-17 NOTE — Progress Notes (Signed)
This service is provided via telemedicine  No vital signs collected/recorded due to the encounter was a telemedicine visit.   Location of patient (ex: home, work):  Home  Patient consents to a telephone visit: Yes, see telephone visit dated 06/17/2020   Location of the provider (ex: office, home):  [facility], Office   Name of any referring provider:  N/A  Names of all persons participating in the telemedicine service and their role in the encounter:  S.Chrae B/CMA, Sherrie Mustache, NP, and Patient   Time spent on call:  11 min with medical assistant    Subjective:   Kimberly [name] is a 75 y.o. female who presents for Medicare Annual (Subsequent) preventive examination.  Review of Systems     Cardiac Risk Factors include: advanced age (>1men, >28 women);hypertension;diabetes mellitus     Objective:    There were no vitals filed for this visit. There is no height or weight on file to calculate BMI.  Advanced Directives 06/17/2020 02/28/2020 01/03/2020 11/01/2019 08/30/2019 05/31/2019 04/14/2018  Does Patient Have a Medical Advance Directive? Yes Yes Yes Yes Yes Yes Yes  Type of Paramedic of [place];Living will Healthcare Power of [place] of [place];Out of facility DNR (pink MOST or yellow form);Living will [place];Out of facility DNR (pink MOST or yellow form);Living will - -  Does patient want to make changes to medical advance directive? - No - Patient declined No - Patient declined No - Patient declined No - Patient declined No - Patient declined No - Patient declined  Copy of [place] in Chart? No - copy requested Yes - validated most recent copy scanned in chart (See row information) Yes - validated most recent copy scanned in chart (See row information) Yes - validated most recent copy scanned in chart (See row information) Yes - validated  most recent copy scanned in chart (See row information) - -  Pre-existing out of facility DNR order (yellow form or pink MOST form) - - - Pink MOST/Yellow Form most recent copy in chart - Physician notified to receive inpatient order Pink MOST/Yellow Form most recent copy in chart - Physician notified to receive inpatient order - -    Current Medications (verified) Outpatient Encounter Medications as of 06/17/2020  Medication Sig  . acetaminophen (TYLENOL) 500 MG tablet Take 1 tablet (500 mg total) by mouth every 6 (six) hours as needed.  . ALPRAZolam (XANAX) 0.5 MG tablet Take 0.25 mg by mouth in the morning, at noon, and at bedtime.  Marland Kitchen atorvastatin (LIPITOR) 20 MG tablet TAKE 1 TABLET BY MOUTH  DAILY  . augmented betamethasone dipropionate (DIPROLENE) 0.05 % ointment Apply to affected area after bathing x 4 weeks . Do not use on face or folds.  . cholecalciferol (VITAMIN D3) 25 MCG (1000 UNIT) tablet Take 1,000 Units by mouth 2 (two) times daily.  . dapagliflozin propanediol (FARXIGA) 5 MG TABS tablet Take 1 tablet (5 mg total) by mouth daily before breakfast.  . esomeprazole (NEXIUM) 20 MG capsule Take 20 mg by mouth daily.  Marland Kitchen glucose blood (ONETOUCH VERIO) test strip Use to test blood sugar daily. Dx: E11.9  . ketoconazole (NIZORAL) 2 % cream Apply 1 application topically 2 (two) times daily as needed (groin rash).  . LORazepam (ATIVAN) 0.5 MG tablet 1 tab q am, 1 tab at noon, 1 tab midafternoon and 1 tab qhs  . losartan (COZAAR)  100 MG tablet Take 100 mg by mouth daily.  . Magnesium 400 MG CAPS Take by mouth.  . metFORMIN (GLUCOPHAGE) 1000 MG tablet TAKE 1 TABLET BY MOUTH TWO  TIMES DAILY WITH A MEAL  . Multiple Vitamins-Minerals (WOMENS 50+ MULTI VITAMIN/MIN PO) Take by mouth.  . nortriptyline (PAMELOR) 25 MG capsule Take 50 mg by mouth at bedtime.  . traMADol (ULTRAM) 50 MG tablet Take 1 tablet (50 mg total) by mouth every 6 (six) hours as needed.  . vitamin k 100 MCG tablet Take 100 mcg  by mouth daily.  . [DISCONTINUED] omeprazole (PRILOSEC) 20 MG capsule Take 20 mg by mouth daily.  . [DISCONTINUED] promethazine (PHENERGAN) 25 MG tablet Take 25 mg by mouth 2 (two) times daily.   No facility-administered encounter medications on file as of 06/17/2020.    Allergies (verified) Keflex [cephalexin]   History: Past Medical History:  Diagnosis Date  . Allergic rhinitis   . Allergy   . Anemia    as child   . Anxiety   . Arthritis   . Atrophic vaginitis 2017  . Cancer ([place])    MOHS nose- skin cancer- basal cell   . Cataract    removed both eyes   . Depression   . Diabetes mellitus without complication ([place])   . GERD (gastroesophageal reflux disease)   . Heart murmur    as child- outgrown   . Hematuria 05/09/2015   had two nonobstructing renal calculi  . Hepatitis A 1970s  . Hyperlipidemia   . Hypertension   . Osteopenia   . PONV (postoperative nausea and vomiting)   . Shingles 06/04/2015  . SUI (stress urinary incontinence, female) 2017  . Thrombocytopenia ([place])    Past Surgical History:  Procedure Laterality Date  . BACK SURGERY  11/2018  . CATARACT EXTRACTION W/ INTRAOCULAR LENS  IMPLANT, BILATERAL Bilateral   . COLONOSCOPY    . KNEE ARTHROSCOPY  2004, 1990s  . TONSILLECTOMY AND ADENOIDECTOMY  1952   Family History  Problem Relation Age of Onset  . Heart disease Father        heart attack  1980  . Stroke Father        min- stroke  24  . Colon polyps Sister   . Cancer Brother        colon (2004)  . Colon cancer Brother   . Esophageal cancer Neg Hx   . Rectal cancer Neg Hx   . Stomach cancer Neg Hx    Social History   Socioeconomic History  . Marital status: Married    Spouse name: Not on file  . Number of children: Not on file  . Years of education: Not on file  . Highest education level: Not on file  Occupational History  . Not on file  Tobacco Use  . Smoking status: Never Smoker  . Smokeless tobacco: Never Used  Vaping Use  .  Vaping Use: Never used  Substance and Sexual Activity  . Alcohol use: Never  . Drug use: Never  . Sexual activity: Not on file  Other Topics Concern  . Not on file  Social History Narrative   Social History     Marital status: Married, 1963           Spouse name: Elenore Rota                      Years of education:  Number of children:   3              Occupational History     None on file: Admin, Music Teacher      Social History Main Topics     Smoking status: Never Smoker                                                                  Smokeless tobacco: Never Used                         Alcohol use: Yes                 Comment: 1-2     Drug use: Not on file      Sexual activity: Not on file            Other Topics            Concern     None on file      Social History Narrative: Low fat & Diabetic diet, lives in a [place] (one story), 2 people and 2 pets( dog and cat), exercise occasional, Has living will, DNR form, POA/HPOA form.     None on file         Social Determinants of Health   Financial Resource Strain: Not on file  Food Insecurity: Not on file  Transportation Needs: Not on file  Physical Activity: Not on file  Stress: Not on file  Social Connections: Not on file    Tobacco Counseling Counseling given: Not Answered   Clinical Intake:  Pre-visit preparation completed: Yes  Pain : No/denies pain     BMI - recorded: 18.9 Nutritional Status: BMI <19  Underweight Nutritional Risks: Unintentional weight loss,Failure to thrive Diabetes: Yes  How often do you need to have someone help you when you read instructions, pamphlets, or other written materials from your doctor or pharmacy?: 1 - Never  Diabetic?no         Activities of Daily Living In your present state of health, do you have any difficulty performing the following activities: 06/17/2020  Hearing? N  Vision? N  Difficulty concentrating or making decisions? N  Walking or  climbing stairs? N  Dressing or bathing? N  Doing errands, shopping? N  Preparing Food and eating ? N  Using the Toilet? N  In the past six months, have you accidently leaked urine? Y  Do you have problems with loss of bowel control? N  Managing your Medications? N  Managing your Finances? N  Housekeeping or managing your Housekeeping? N  Some recent data might be hidden    Patient Care Team: Gayland Curry, DO as PCP - General (Geriatric Medicine) Alanson Aly, MD as Referring Physician (Internal Medicine) Lavonna Monarch, MD as Consulting Physician (Dermatology)  Indicate any recent Medical Services you may have received from other than [facility] providers in the past year (date may be approximate).     Assessment:   This is a routine wellness examination for Kimberly [name].  Hearing/Vision screen  Hearing Screening   125Hz  250Hz  500Hz  1000Hz  2000Hz  3000Hz  4000Hz  6000Hz  8000Hz   Right ear:           Left ear:  Comments: No hearing issues   Vision Screening Comments: Last eye exam less 12 months   Dietary issues and exercise activities discussed: Current Exercise Habits: The patient does not participate in regular exercise at present  Goals    . Patient Stated     To get control of her anxiety and depression       Depression Screen PHQ 2/9 Scores 06/17/2020 02/28/2020 01/03/2020 08/30/2019 05/31/2019 10/26/2018 08/24/2018  PHQ - 2 Score 0 2 - - - 0 0  PHQ- 9 Score - - - - - - -  Exception Documentation Other- indicate reason in comment box Other- indicate reason in comment box Other- indicate reason in comment box Other- indicate reason in comment box Other- indicate reason in comment box - -  Not completed Already under the care of Psych, will see today - - - Patient would like to talk to someone - -    Fall Risk Fall Risk  06/17/2020 02/28/2020 01/03/2020 11/01/2019 08/30/2019  Falls in the past year? 1 0 0 1 0  Number falls in past yr: 1 0 0 1 1  Injury with Fall? 0 0 0 - 0   Risk for fall due to : History of fall(s) - - - -    FALL RISK PREVENTION PERTAINING TO THE HOME:  Any stairs in or around the home? No  If so, are there any without handrails? No  Home free of loose throw rugs in walkways, pet beds, electrical cords, etc? Yes  Adequate lighting in your home to reduce risk of falls? Yes   ASSISTIVE DEVICES UTILIZED TO PREVENT FALLS:  Life alert? No  Use of a cane, walker or w/c? No  Grab bars in the bathroom? Yes  Shower chair or bench in shower? Yes  Elevated toilet seat or a handicapped toilet? No   TIMED UP AND GO:  Was the test performed? No .    Cognitive Function: MMSE - Mini Mental State Exam 09/21/2017 05/20/2016  Orientation to time 5 5  Orientation to Place 5 5  Registration 3 3  Attention/ Calculation 5 5  Recall 3 3  Language- name 2 objects 2 2  Language- repeat 1 1  Language- follow 3 step command 3 3  Language- read & follow direction 1 1  Write a sentence 1 1  Copy design 1 1  Total score 30 30     6CIT Screen 06/17/2020  What Year? 0 points  What month? 0 points  What time? 0 points  Count back from 20 0 points  Months in reverse 0 points  Repeat phrase 0 points  Total Score 0    Immunizations Immunization History  Administered Date(s) Administered  . Influenza, High Dose Seasonal PF 01/19/2019, 02/02/2020  . Influenza,inj,Quad PF,6+ Mos 02/04/2016, 01/28/2018  . Moderna Sars-Covid-2 Vaccination 04/18/2019, 05/16/2019, 02/20/2020  . Pneumococcal Conjugate-13 12/20/2013  . Pneumococcal Polysaccharide-23 08/01/2007  . Pneumococcal-Unspecified 07/31/2009  . Tdap 04/07/2007, 11/30/2019  . Zoster 04/07/2007, 07/31/2009  . Zoster Recombinat (Shingrix) 04/13/2018    TDAP status: Up to date  Flu Vaccine status: Up to date  Pneumococcal vaccine status: Up to date  Covid-19 vaccine status: Completed vaccines  Qualifies for Shingles Vaccine? Yes   Zostavax completed No   Shingrix Completed?:  Yes  Screening Tests Health Maintenance  Topic Date Due  . FOOT EXAM  04/16/2020  . OPHTHALMOLOGY EXAM  07/04/2020  . COVID-19 Vaccine (4 - Booster for Moderna series) 08/19/2020  . HEMOGLOBIN A1C  11/13/2020  . TETANUS/TDAP  11/29/2029  . INFLUENZA VACCINE  Completed  . DEXA SCAN  Completed  . Hepatitis C Screening  Completed  . PNA vac Low Risk Adult  Completed  . HPV VACCINES  Aged Out    Health Maintenance  Health Maintenance Due  Topic Date Due  . FOOT EXAM  04/16/2020    Colorectal cancer screening: No longer required.   Mammogram status: Completed 2021.Marland Kitchen Repeat every year  Bone Density status: Completed 12/21. Results reflect: Bone density results: OSTEOPENIA. Repeat every 2 years.  Lung Cancer Screening: (Low Dose CT Chest recommended if Age 36-80 years, 30 pack-year currently smoking OR have quit w/in 15years.) does not qualify.     Additional Screening:  Hepatitis C Screening: does qualify; Completed na  Vision Screening: Recommended annual ophthalmology exams for early detection of glaucoma and other disorders of the eye. Is the patient up to date with their annual eye exam?  Yes  Who is the provider or what is the name of the office in which the patient attends annual eye exams? [facility] If pt is not established with a provider, would they like to be referred to a provider to establish care? No .   Dental Screening: Recommended annual dental exams for proper oral hygiene  Community Resource Referral / Chronic Care Management: CRR required this visit?  No   CCM required this visit?  No      Plan:     I have personally reviewed and noted the following in the patient's chart:   . Medical and social history . Use of alcohol, tobacco or illicit drugs  . Current medications and supplements . Functional ability and status . Nutritional status . Physical activity . Advanced directives . List of other physicians . Hospitalizations, surgeries, and  ER visits in previous 12 months . Vitals . Screenings to include cognitive, depression, and falls . Referrals and appointments  In addition, I have reviewed and discussed with patient certain preventive protocols, quality metrics, and best practice recommendations. A written personalized care plan for preventive services as well as general preventive health recommendations were provided to patient.     Lauree Chandler, NP   06/17/2020    Virtual Visit via Telephone Note  I connected with@ on 06/17/20 at 11:30 AM EDT by telephone and verified that I am speaking with the correct person using two identifiers.  Location: Patient: home Provider: [facility]   I discussed the limitations, risks, security and privacy concerns of performing an evaluation and management service by telephone and the availability of in person appointments. I also discussed with the patient that there may be a patient responsible charge related to this service. The patient expressed understanding and agreed to proceed.   I discussed the assessment and treatment plan with the patient. The patient was provided an opportunity to ask questions and all were answered. The patient agreed with the plan and demonstrated an understanding of the instructions.   The patient was advised to call back or seek an in-person evaluation if the symptoms worsen or if the condition fails to improve as anticipated.  I provided 18 minutes of non-face-to-face time during this encounter.  Carlos American. Harle Battiest Avs printed and mailed

## 2020-06-17 NOTE — Telephone Encounter (Signed)
[name], harding are scheduled for a virtual visit with your provider today.    Just as we do with appointments in the office, we must obtain your consent to participate.  Your consent will be active for this visit and any virtual visit you may have with one of our providers in the next 365 days.    If you have a MyChart account, I can also send a copy of this consent to you electronically.  All virtual visits are billed to your insurance company just like a traditional visit in the office.  As this is a virtual visit, video technology does not allow for your provider to perform a traditional examination.  This may limit your provider's ability to fully assess your condition.  If your provider identifies any concerns that need to be evaluated in person or the need to arrange testing such as labs, EKG, etc, we will make arrangements to do so.    Although advances in technology are sophisticated, we cannot ensure that it will always work on either your end or our end.  If the connection with a video visit is poor, we may have to switch to a telephone visit.  With either a video or telephone visit, we are not always able to ensure that we have a secure connection.   I need to obtain your verbal consent now.   Are you willing to proceed with your visit today?   Kimberly [name] has provided verbal consent on 06/17/2020 for a virtual visit (video or telephone).   [name] Aurora V[place], [place] 06/17/2020  11:30 am

## 2020-06-18 ENCOUNTER — Encounter: Payer: Self-pay | Admitting: Internal Medicine

## 2020-06-18 NOTE — Telephone Encounter (Signed)
This has been canceledl already

## 2020-06-19 DIAGNOSIS — M4722 Other spondylosis with radiculopathy, cervical region: Secondary | ICD-10-CM | POA: Diagnosis not present

## 2020-06-19 DIAGNOSIS — Z6823 Body mass index (BMI) 23.0-23.9, adult: Secondary | ICD-10-CM | POA: Diagnosis not present

## 2020-06-19 DIAGNOSIS — M7551 Bursitis of right shoulder: Secondary | ICD-10-CM | POA: Diagnosis not present

## 2020-06-19 DIAGNOSIS — M5412 Radiculopathy, cervical region: Secondary | ICD-10-CM | POA: Diagnosis not present

## 2020-07-01 ENCOUNTER — Encounter: Payer: Self-pay | Admitting: Dermatology

## 2020-07-01 DIAGNOSIS — M5412 Radiculopathy, cervical region: Secondary | ICD-10-CM | POA: Diagnosis not present

## 2020-07-01 DIAGNOSIS — M7551 Bursitis of right shoulder: Secondary | ICD-10-CM | POA: Diagnosis not present

## 2020-07-01 DIAGNOSIS — M25512 Pain in left shoulder: Secondary | ICD-10-CM | POA: Diagnosis not present

## 2020-07-01 NOTE — Progress Notes (Signed)
   Follow-Up Visit   Subjective  Kimberly [name] is a 75 y.o. female who presents for the following: Rash (X 1 WEEK ITCH IF SHE TOUCHES IT, ALSO POST NECK TX OTC ANTIBIOTIC CREAM. PATIENT STATED SHE HAD A NERVOUS BREAKDOWN AND SHE WAS ADDED A LOT OF MEDS, HUSBAND IS IN SKILLED CARE SHE NOW LIVES ALONE).  Rashes Location:  Duration:  Quality:  Associated Signs/Symptoms: Modifying Factors: Under tremendous personal/familial stress. Severity:  Timing: Context:   Objective  Well appearing patient in no apparent distress; mood and affect are within normal limits. Objective  Left Lower Leg - Posterior, Neck - Posterior: Patches of nonmarginated chronic dermatitis.  Feet clear.  This fits of nummular eczema perhaps with a component of stasis on legs.    A focused examination was performed including Head, neck, back, arms, legs.. Relevant physical exam findings are noted in the Assessment and Plan.   Assessment & Plan    Eczema, unspecified type (2) Neck - Posterior; Left Lower Leg - Posterior  Patient will start using new topical ointment (generic Diprolene) daily after bathing for 1 month.  This should not be used on the face or body folds.  Follow-up at that time by MyChart or telephone.  Ordered Medications: augmented betamethasone dipropionate (DIPROLENE) 0.05 % ointment      I, Lavonna Monarch, MD, have reviewed all documentation for this visit.  The documentation on 07/01/20 for the exam, diagnosis, procedures, and orders are all accurate and complete.

## 2020-07-02 ENCOUNTER — Other Ambulatory Visit: Payer: Self-pay | Admitting: Internal Medicine

## 2020-09-18 ENCOUNTER — Other Ambulatory Visit: Payer: Self-pay | Admitting: Internal Medicine

## 2020-09-18 ENCOUNTER — Telehealth: Payer: Self-pay

## 2020-09-18 MED ORDER — LORAZEPAM 0.5 MG PO TABS: ORAL_TABLET | ORAL | 1 refills | Status: DC

## 2020-09-20 NOTE — Telephone Encounter (Signed)
NA

## 2020-09-26 LAB — BASIC METABOLIC PANEL
BUN: 19 (ref 4–21)
CO2: 21 (ref 13–22)
Chloride: 101 (ref 99–108)
Creatinine: 0.7 (ref 0.5–1.1)
Glucose: 129
Potassium: 4.5 (ref 3.4–5.3)
Sodium: 137 (ref 137–147)

## 2020-09-26 LAB — TSH: TSH: 1.67 (ref 0.41–5.90)

## 2020-09-26 LAB — HEMOGLOBIN A1C: Hemoglobin A1C: 7

## 2020-09-26 LAB — CBC: RBC: 4.11 (ref 3.87–5.11)

## 2020-09-26 LAB — CBC AND DIFFERENTIAL
HCT: 37 (ref 36–46)
Hemoglobin: 12.4 (ref 12.0–16.0)
Platelets: 381 (ref 150–399)
WBC: 7.3

## 2020-09-26 LAB — COMPREHENSIVE METABOLIC PANEL: Calcium: 9.1 (ref 8.7–10.7)

## 2020-10-02 ENCOUNTER — Other Ambulatory Visit: Payer: Self-pay

## 2020-10-02 ENCOUNTER — Encounter: Payer: Self-pay | Admitting: Internal Medicine

## 2020-10-02 ENCOUNTER — Other Ambulatory Visit: Payer: Self-pay | Admitting: *Deleted

## 2020-10-02 ENCOUNTER — Non-Acute Institutional Stay: Payer: Medicare Other | Admitting: Internal Medicine

## 2020-10-02 VITALS — BP 122/90 | HR 108 | Temp 97.9°F | Ht 61.0 in | Wt 108.8 lb

## 2020-10-02 DIAGNOSIS — E119 Type 2 diabetes mellitus without complications: Secondary | ICD-10-CM | POA: Diagnosis not present

## 2020-10-02 DIAGNOSIS — R000 Tachycardia, unspecified: Secondary | ICD-10-CM | POA: Diagnosis not present

## 2020-10-02 DIAGNOSIS — E1169 Type 2 diabetes mellitus with other specified complication: Secondary | ICD-10-CM

## 2020-10-02 DIAGNOSIS — F332 Major depressive disorder, recurrent severe without psychotic features: Secondary | ICD-10-CM | POA: Diagnosis not present

## 2020-10-02 DIAGNOSIS — E785 Hyperlipidemia, unspecified: Secondary | ICD-10-CM

## 2020-10-02 DIAGNOSIS — F411 Generalized anxiety disorder: Secondary | ICD-10-CM

## 2020-10-02 MED ORDER — ATORVASTATIN CALCIUM 20 MG PO TABS: 20.0000 mg | ORAL_TABLET | Freq: Every day | ORAL | 3 refills | Status: AC

## 2020-10-02 NOTE — Telephone Encounter (Signed)
[facility] requested refill.  ? ?

## 2020-10-03 NOTE — Progress Notes (Signed)
Location:  [place] of Service:  Clinic (12)  Provider:   Code Status:  Goals of Care:  Advanced Directives 10/02/2020  Does Patient Have a Medical Advance Directive? No  Type of Advance Directive -  Does patient want to make changes to medical advance directive? -  Copy of [place] in Chart? -  Would patient like information on creating a medical advance directive? No - Patient declined  Pre-existing out of facility DNR order (yellow form or pink MOST form) -     Chief Complaint  Patient presents with   Medical Management of Chronic Issues    Patient returns to the clinic for follow up.    Health Maintenance    Patient had shingles in her eye. Seeing an eye doctor. Due for foot exam. Due for #4 covid vaccine.     HPI: Patient is a 75 y.o. female seen today for medical management of chronic diseases.    Patient has h/o Hypertension, Type 2 Diabetes, and Cervical Radiculopathy  But her main active issue is Anxiety with Major depression Patient lost her husband 7 weeks ago.It was sudden death but he was declining for few months  She is very upset. Was crying She is seeing Dr Casimiro Needle and another therapist Her sons are helping her specially at night They have cremated him but funeral service is pending and she feels like she did not get closure yet No Other issues today Could not get much history due to her being upset Has lost some weight From last visit with Dr Mariea Clonts but she says she has gained some back. Eating and sleeping well She has had few Falls Lately more due ti her missing her footing No Injuries Denies any Dizziness  Past Medical History:  Diagnosis Date   Allergic rhinitis    Allergy    Anemia    as child    Anxiety    Arthritis    Atrophic vaginitis 2017   Cancer ([place])    MOHS nose- skin cancer- basal cell    Cataract    removed both eyes    Depression    Diabetes mellitus without complication ([place])     GERD (gastroesophageal reflux disease)    Heart murmur    as child- outgrown    Hematuria 05/09/2015   had two nonobstructing renal calculi   Hepatitis A 1970s   Hyperlipidemia    Hypertension    Osteopenia    PONV (postoperative nausea and vomiting)    Shingles 06/04/2015   SUI (stress urinary incontinence, female) 2017   Thrombocytopenia ([place])     Past Surgical History:  Procedure Laterality Date   BACK SURGERY  11/2018   CATARACT EXTRACTION W/ INTRAOCULAR LENS  IMPLANT, BILATERAL Bilateral    COLONOSCOPY     KNEE ARTHROSCOPY  2004, 1990s   TONSILLECTOMY AND ADENOIDECTOMY  1952    Allergies  Allergen Reactions   Keflex [Cephalexin]     Outpatient Encounter Medications as of 10/02/2020  Medication Sig   ALPRAZolam (XANAX) 0.5 MG tablet Take 0.5 mg by mouth in the morning, at noon, and at bedtime.   atorvastatin (LIPITOR) 20 MG tablet Take 1 tablet (20 mg total) by mouth daily.   augmented betamethasone dipropionate (DIPROLENE) 0.05 % ointment Apply to affected area after bathing x 4 weeks . Do not use on face or folds.   calcium carbonate (OSCAL) 1500 (600 Ca) MG TABS tablet Take 600 mg of elemental  calcium by mouth daily.   cholecalciferol (VITAMIN D3) 25 MCG (1000 UNIT) tablet Take 1,000 Units by mouth 2 (two) times daily.   dapagliflozin propanediol (FARXIGA) 5 MG TABS tablet Take 1 tablet (5 mg total) by mouth daily before breakfast.   glucose blood (ONETOUCH VERIO) test strip Use to test blood sugar daily. Dx: E11.9   ketoconazole (NIZORAL) 2 % cream Apply 1 application topically 2 (two) times daily as needed (groin rash).   LORazepam (ATIVAN) 0.5 MG tablet 1 tab q am, 1 tab at noon, 1 tab midafternoon and 1 tab qhs   losartan (COZAAR) 100 MG tablet Take 100 mg by mouth daily.   Magnesium 400 MG CAPS Take by mouth.   metFORMIN (GLUCOPHAGE) 1000 MG tablet TAKE 1 TABLET BY MOUTH  TWICE DAILY WITH  MEALS   Multiple Vitamins-Minerals (WOMENS 50+ MULTI VITAMIN/MIN PO)  Take by mouth.   nortriptyline (PAMELOR) 25 MG capsule Take 50 mg by mouth at bedtime.   vitamin k 100 MCG tablet Take 100 mcg by mouth daily.   [DISCONTINUED] acetaminophen (TYLENOL) 500 MG tablet Take 1 tablet (500 mg total) by mouth every 6 (six) hours as needed.   [DISCONTINUED] atorvastatin (LIPITOR) 20 MG tablet TAKE 1 TABLET BY MOUTH  DAILY   [DISCONTINUED] esomeprazole (NEXIUM) 20 MG capsule Take 20 mg by mouth daily.   [DISCONTINUED] traMADol (ULTRAM) 50 MG tablet Take 1 tablet (50 mg total) by mouth every 6 (six) hours as needed.   No facility-administered encounter medications on file as of 10/02/2020.    Review of Systems:  Review of Systems  Constitutional:  Positive for activity change.  HENT: Negative.    Respiratory: Negative.    Cardiovascular: Negative.   Gastrointestinal: Negative.   Genitourinary: Negative.   Musculoskeletal:  Positive for gait problem.  Skin: Negative.   Neurological:  Positive for weakness.  Psychiatric/Behavioral:  Positive for dysphoric mood and sleep disturbance. The patient is nervous/anxious.    Health Maintenance  Topic Date Due   Zoster Vaccines- Shingrix (2 of 2) 06/08/2018   FOOT EXAM  04/16/2020   COVID-19 Vaccine (4 - Booster for Moderna series) 05/22/2020   OPHTHALMOLOGY EXAM  07/04/2020   INFLUENZA VACCINE  11/04/2020   HEMOGLOBIN A1C  03/28/2021   TETANUS/TDAP  11/29/2029   DEXA SCAN  Completed   Hepatitis C Screening  Completed   PNA vac Low Risk Adult  Completed   HPV VACCINES  Aged Out    Physical Exam: Vitals:   10/02/20 1054  BP: 122/90  Pulse: (!) 108  Temp: 97.9 F (36.6 C)  SpO2: 95%  Weight: 108 lb 12.8 oz (49.4 kg)  Height: 5\' 1"  (1.549 m)   Body mass index is 20.56 kg/m. Physical Exam Constitutional: Oriented to person, place, and time. Well-developed and well-nourished.  HENT:  Head: Normocephalic.  Mouth/Throat: Oropharynx is clear and moist.  Eyes: Pupils are equal, round, and reactive to light.   Neck: Neck supple.  Cardiovascular: Tachycardia Present and normal heart sounds.  No murmur heard. Pulmonary/Chest: Effort normal and breath sounds normal. No respiratory distress. No wheezes. She has no rales.  Abdominal: Soft. Bowel sounds are normal. No distension. There is no tenderness. There is no rebound.  Musculoskeletal: No edema. Diabetic Foot exam done Lymphadenopathy: none Neurological: Alert and oriented to person, place, and time. Skin: Skin is warm and dry.  Psychiatric: Very Upset Crying and Anxious.    Labs reviewed: Basic Metabolic Panel: Recent Labs    05/16/20 0000  09/26/20 0000  NA 141 137  K 4.3 4.5  CL 104 101  CO2 104* 21  BUN 19 19  CREATININE 0.7 0.7  CALCIUM 9.1 9.1  TSH  --  1.67   Liver Function Tests: No results for input(s): AST, ALT, ALKPHOS, BILITOT, PROT, ALBUMIN in the last 8760 hours. No results for input(s): LIPASE, AMYLASE in the last 8760 hours. No results for input(s): AMMONIA in the last 8760 hours. CBC: Recent Labs    05/16/20 0000 09/26/20 0000  WBC 8.0 7.3  HGB 12.6 12.4  HCT 36 37  PLT 533* 381   Lipid Panel: Recent Labs    05/16/20 0000  CHOL 130  HDL 56  LDLCALC 48  TRIG 129   Lab Results  Component Value Date   HGBA1C 7.0 09/26/2020    Procedures since last visit: No results found.  Assessment/Plan 1. Type 2 diabetes mellitus without complication, without long-term current use of insulin (HCC) A1C much Improved On Farxiga and Metformin Side effects discussed Diabetic Foot exam Done NO change  Does see Opthalmology  2. Severe episode of recurrent major depressive disorder, without psychotic features ([place]) On Pamelor Also see Dr Casimiro Needle  3. Tachycardia Most likely due to her Anxiety Will review next visit  4. GAD (generalized anxiety disorder) She is on High doses of Benzos per [employer] with these falls I discussed her risk Would not change due to situational anxiety   5.  Hyperlipidemia associated with type 2 diabetes mellitus ([place]) LDL good limits On Statin    Labs/tests ordered:  * No order type specified * Next appt:  01/22/2021

## 2020-10-09 ENCOUNTER — Encounter: Payer: Self-pay | Admitting: Internal Medicine

## 2020-10-17 ENCOUNTER — Other Ambulatory Visit: Payer: Self-pay | Admitting: Internal Medicine

## 2020-10-17 NOTE — Telephone Encounter (Signed)
Refill request received for Alprazolam take 1/2 tablet every morning, at non and at bedtime. Medication pended and sent to [place], NP for approval

## 2020-10-22 ENCOUNTER — Telehealth: Payer: Self-pay | Admitting: Family Medicine

## 2020-10-22 ENCOUNTER — Other Ambulatory Visit: Payer: Self-pay | Admitting: Orthopedic Surgery

## 2020-10-22 DIAGNOSIS — M1712 Unilateral primary osteoarthritis, left knee: Secondary | ICD-10-CM

## 2020-10-22 NOTE — Telephone Encounter (Signed)
Her last Synvisc One was on 05/24/20. Please advise.

## 2020-10-22 NOTE — Telephone Encounter (Signed)
Requesting approval for gel injections for left knee OA.

## 2020-10-22 NOTE — Telephone Encounter (Signed)
Noted.  Duplicate.

## 2020-10-22 NOTE — Telephone Encounter (Signed)
I called and left voice message that approval has been requested. She will be called once we obtain approval.

## 2020-10-22 NOTE — Telephone Encounter (Signed)
Pt is wondering if she can get another gel injection next month. She wanted to go ahead and see if it will be approved by her insurance?   CB [phone]

## 2020-10-22 NOTE — Telephone Encounter (Signed)
Submitted VOB for SynviscOne, left knee. Pending BV.

## 2020-10-23 NOTE — Telephone Encounter (Signed)
Good Morning Dr. Lyndel Safe, medication is still pending for approval.   Message routed back to Dr. Veleta Miners for approval.

## 2020-10-29 ENCOUNTER — Telehealth: Payer: Self-pay

## 2020-10-29 NOTE — Telephone Encounter (Signed)
Approved for SynviscOne, left knee. Buy & Bill Covered at 100% after Co-pay Co-pay of $35.00 No PA required  Appt. 11/22/2020 with Dr. Junius Roads

## 2020-10-31 ENCOUNTER — Ambulatory Visit: Payer: Medicare Other | Admitting: Family Medicine

## 2020-11-14 ENCOUNTER — Other Ambulatory Visit: Payer: Self-pay | Admitting: Adult Health

## 2020-11-14 MED ORDER — VITAMIN K 100 MCG PO TABS: 100.0000 ug | ORAL_TABLET | Freq: Every day | ORAL | 11 refills | Status: DC

## 2020-11-22 ENCOUNTER — Encounter: Payer: Self-pay | Admitting: Family Medicine

## 2020-11-22 ENCOUNTER — Ambulatory Visit: Payer: Medicare Other | Admitting: Family Medicine

## 2020-11-22 ENCOUNTER — Other Ambulatory Visit: Payer: Self-pay

## 2020-11-22 DIAGNOSIS — M1712 Unilateral primary osteoarthritis, left knee: Secondary | ICD-10-CM

## 2020-11-22 NOTE — Progress Notes (Signed)
Office Visit Note   Patient: Kimberly [name]           Date of Birth: 18-Jun-1945           MRN: [id number] Visit Date: 11/22/2020 Requested by: Virgie Dad, MD [address] [place],  [place] [id number] PCP: Virgie Dad, MD  Subjective: Chief Complaint  Patient presents with   Left Knee - Pain    HPI: She is here for planned Synvisc 1 injection for left knee osteoarthritis.  The last injection helped, it has worn off in the past couple months.  Unfortunately her husband of 29 years died a few months ago.  She is struggling with depression related to that.                ROS:   All other systems were reviewed and are negative.  Objective: Vital Signs: There were no vitals taken for this visit.  Physical Exam:  General:  Alert and oriented, in no acute distress. Pulm:  Breathing unlabored. Psy:  Normal mood, congruent affect.  Left knee: No significant effusion today, no warmth or erythema.  Imaging: No results found.  Assessment & Plan: Left knee osteoarthritis -Synvisc 1 today.  Follow-up as needed.     Procedures: After sterile prep with Betadine, injected 3 cc 1% lidocaine without epinephrine and Synvisc 1 from lateral midpatellar approach.       PMFS History: Patient Active Problem List   Diagnosis Date Noted   Depression, major, single episode, severe ([place]) 01/03/2020   GAD (generalized anxiety disorder) 01/03/2020   Chronic right-sided low back pain with right-sided sciatica 07/21/2018   Cervical radiculopathy 07/21/2018   Caregiver stress syndrome 11/22/2016   Uncontrolled hypertension 11/22/2016   Adjustment disorder with anxious mood 02/04/2016   Hyperlipidemia associated with type 2 diabetes mellitus ([place]) 02/04/2016   Type 2 diabetes mellitus without complication, without long-term current use of insulin ([place]) 02/04/2016   Basal cell carcinoma of left nasal sidewall 10/22/2011   Past Medical History:  Diagnosis Date   Allergic rhinitis     Allergy    Anemia    as child    Anxiety    Arthritis    Atrophic vaginitis 2017   Cancer ([place])    MOHS nose- skin cancer- basal cell    Cataract    removed both eyes    Depression    Diabetes mellitus without complication (HCC)    GERD (gastroesophageal reflux disease)    Heart murmur    as child- outgrown    Hematuria 05/09/2015   had two nonobstructing renal calculi   Hepatitis A 1970s   Hyperlipidemia    Hypertension    Osteopenia    PONV (postoperative nausea and vomiting)    Shingles 06/04/2015   SUI (stress urinary incontinence, female) 2017   Thrombocytopenia ([place])     Family History  Problem Relation Age of Onset   Heart disease Father        heart attack  1980   Stroke Father        min- stroke  1980   Colon polyps Sister    Cancer Brother        colon (2004)   Colon cancer Brother    Esophageal cancer Neg Hx    Rectal cancer Neg Hx    Stomach cancer Neg Hx     Past Surgical History:  Procedure Laterality Date   BACK SURGERY  11/2018   CATARACT EXTRACTION W/ INTRAOCULAR  LENS  IMPLANT, BILATERAL Bilateral    COLONOSCOPY     KNEE ARTHROSCOPY  2004, [place]   Social History   Occupational History   Not on file  Tobacco Use   Smoking status: Never   Smokeless tobacco: Never  Vaping Use   Vaping Use: Never used  Substance and Sexual Activity   Alcohol use: Never   Drug use: Never   Sexual activity: Not on file

## 2020-12-10 ENCOUNTER — Other Ambulatory Visit: Payer: Self-pay | Admitting: Internal Medicine

## 2020-12-18 ENCOUNTER — Other Ambulatory Visit (HOSPITAL_BASED_OUTPATIENT_CLINIC_OR_DEPARTMENT_OTHER): Payer: Self-pay

## 2020-12-18 ENCOUNTER — Other Ambulatory Visit: Payer: Self-pay | Admitting: *Deleted

## 2020-12-18 MED ORDER — ONETOUCH VERIO VI STRP: ORAL_STRIP | 3 refills | Status: AC

## 2020-12-18 NOTE — Telephone Encounter (Signed)
[facility] Requested refill.

## 2021-01-14 ENCOUNTER — Other Ambulatory Visit: Payer: Self-pay | Admitting: *Deleted

## 2021-01-14 DIAGNOSIS — E119 Type 2 diabetes mellitus without complications: Secondary | ICD-10-CM

## 2021-01-14 MED ORDER — DAPAGLIFLOZIN PROPANEDIOL 5 MG PO TABS: 5.0000 mg | ORAL_TABLET | Freq: Every day | ORAL | 3 refills | Status: DC

## 2021-01-14 NOTE — Telephone Encounter (Signed)
Pharmacy requested refill

## 2021-01-21 LAB — LIPID PANEL
Cholesterol: 118 (ref 0–200)
HDL: 60 (ref 35–70)
LDL Cholesterol: 36
LDl/HDL Ratio: 2
Triglycerides: 109 (ref 40–160)

## 2021-01-21 LAB — BASIC METABOLIC PANEL
BUN: 22 — AB (ref 4–21)
CO2: 24 — AB (ref 13–22)
Chloride: 107 (ref 99–108)
Creatinine: 0.9 (ref 0.5–1.1)
Glucose: 105
Potassium: 4.9 (ref 3.4–5.3)
Sodium: 143 (ref 137–147)

## 2021-01-21 LAB — CBC AND DIFFERENTIAL
HCT: 38 (ref 36–46)
Hemoglobin: 12.5 (ref 12.0–16.0)
Platelets: 366 (ref 150–399)
WBC: 5.8

## 2021-01-21 LAB — HEPATIC FUNCTION PANEL
ALT: 13 (ref 7–35)
AST: 14 (ref 13–35)
Alkaline Phosphatase: 87 (ref 25–125)
Bilirubin, Total: 0.2

## 2021-01-21 LAB — COMPREHENSIVE METABOLIC PANEL
Albumin: 4.1 (ref 3.5–5.0)
Calcium: 9.5 (ref 8.7–10.7)
Globulin: 1.9

## 2021-01-21 LAB — CBC: RBC: 4.02 (ref 3.87–5.11)

## 2021-01-21 LAB — HEMOGLOBIN A1C: Hemoglobin A1C: 6.6

## 2021-01-22 ENCOUNTER — Non-Acute Institutional Stay: Payer: Medicare Other | Admitting: Internal Medicine

## 2021-01-22 ENCOUNTER — Other Ambulatory Visit: Payer: Self-pay

## 2021-01-22 ENCOUNTER — Encounter: Payer: Self-pay | Admitting: Internal Medicine

## 2021-01-22 VITALS — BP 136/88 | HR 94 | Temp 98.0°F | Ht 61.0 in | Wt 112.8 lb

## 2021-01-22 DIAGNOSIS — E785 Hyperlipidemia, unspecified: Secondary | ICD-10-CM

## 2021-01-22 DIAGNOSIS — F332 Major depressive disorder, recurrent severe without psychotic features: Secondary | ICD-10-CM | POA: Diagnosis not present

## 2021-01-22 DIAGNOSIS — E119 Type 2 diabetes mellitus without complications: Secondary | ICD-10-CM | POA: Diagnosis not present

## 2021-01-22 DIAGNOSIS — F411 Generalized anxiety disorder: Secondary | ICD-10-CM | POA: Diagnosis not present

## 2021-01-22 DIAGNOSIS — E1169 Type 2 diabetes mellitus with other specified complication: Secondary | ICD-10-CM | POA: Diagnosis not present

## 2021-01-22 DIAGNOSIS — E1142 Type 2 diabetes mellitus with diabetic polyneuropathy: Secondary | ICD-10-CM

## 2021-01-22 DIAGNOSIS — M5412 Radiculopathy, cervical region: Secondary | ICD-10-CM

## 2021-01-22 NOTE — Progress Notes (Signed)
Location:  [place] of Service:  Clinic (12)  Provider:   Code Status:  Goals of Care:  Advanced Directives 01/22/2021  Does Patient Have a Medical Advance Directive? Yes  Type of Advance Directive [place]  Does patient want to make changes to medical advance directive? No - Patient declined  Copy of [place] in Chart? -  Would patient like information on creating a medical advance directive? -  Pre-existing out of facility DNR order (yellow form or pink MOST form) -     Chief Complaint  Patient presents with   Medical Management of Chronic Issues    Patient returns to the clinic for her 4 month follow up.    Quality Metric Gaps    Patient had shingles virus previously in her eyes. No foot exam, due for #4 covid vaccine, eye exam schedued for 04/06/21, flu shot tomorrow     HPI: Patient is a 75 y.o. female seen today for medical management of chronic diseases.    Patient has h/o Hypertension, Type 2 Diabetes, and Cervical Radiculopathy   But her main active issue is Anxiety with Major depression Patient lost her husband Few months ago here in [place] It was Acute Since then she had been struggling with Anxiety and depression Is seeing Dr Casimiro Needle On Both Ativan and Xanax She did have one fall with no injuries few days ago.  Trying to wean herself off Xanax Mood is better. Sleeping good. Her sons are helping a lot. Weigh is better No Other complains today  Past Medical History:  Diagnosis Date   Allergic rhinitis    Allergy    Anemia    as child    Anxiety    Arthritis    Atrophic vaginitis 2017   Cancer ([place])    MOHS nose- skin cancer- basal cell    Cataract    removed both eyes    Depression    Diabetes mellitus without complication ([place])    GERD (gastroesophageal reflux disease)    Heart murmur    as child- outgrown    Hematuria 05/09/2015   had two nonobstructing renal calculi    Hepatitis A 1970s   Hyperlipidemia    Hypertension    Osteopenia    PONV (postoperative nausea and vomiting)    Shingles 06/04/2015   SUI (stress urinary incontinence, female) 2017   Thrombocytopenia ([place])     Past Surgical History:  Procedure Laterality Date   BACK SURGERY  11/2018   CATARACT EXTRACTION W/ INTRAOCULAR LENS  IMPLANT, BILATERAL Bilateral    COLONOSCOPY     KNEE ARTHROSCOPY  2004, 1990s   TONSILLECTOMY AND ADENOIDECTOMY  1952    Allergies  Allergen Reactions   Keflex [Cephalexin]     Outpatient Encounter Medications as of 01/22/2021  Medication Sig   acetaminophen (TYLENOL) 500 MG tablet Take 500 mg by mouth every 6 (six) hours as needed.   ALPRAZolam (XANAX) 0.5 MG tablet TAKE 1/2 TABLET (0.25MG ) BY MOUTH EVERY MORNING, AT NOON AND AT BEDTIME (CONTROL) (Patient taking differently: 0.5 mg 3 (three) times daily as needed.)   atorvastatin (LIPITOR) 20 MG tablet Take 1 tablet (20 mg total) by mouth daily.   buPROPion (WELLBUTRIN XL) 150 MG 24 hr tablet Take 150 mg by mouth daily.   dapagliflozin propanediol (FARXIGA) 5 MG TABS tablet Take 1 tablet (5 mg total) by mouth daily before breakfast.   esomeprazole (NEXIUM) 20 MG capsule Take  20 mg by mouth daily at 12 noon.   glucose blood (ONETOUCH VERIO) test strip Use to test blood sugar daily. Dx: E11.9   ketoconazole (NIZORAL) 2 % cream Apply 1 application topically 2 (two) times daily as needed (groin rash).   LORazepam (ATIVAN) 0.5 MG tablet 1 tab q am, 1 tab at noon, 1 tab midafternoon and 1 tab qhs   losartan (COZAAR) 100 MG tablet Take 100 mg by mouth daily.   Magnesium 400 MG CAPS Take by mouth.   metFORMIN (GLUCOPHAGE) 1000 MG tablet TAKE 1 TABLET BY MOUTH  TWICE DAILY WITH  MEALS   Multiple Vitamins-Minerals (WOMENS 50+ MULTI VITAMIN/MIN PO) Take by mouth.   nortriptyline (PAMELOR) 25 MG capsule Take 50 mg by mouth at bedtime.   traMADol (ULTRAM) 50 MG tablet Take by mouth every 6 (six) hours as needed.    vitamin k 100 MCG tablet Take 1 tablet (100 mcg total) by mouth daily.   [DISCONTINUED] Multiple Vitamins-Minerals (ONE-A-DAY WOMENS 50+) TABS TAKE 1 TABLET BY MOUTH ONCE DAILY   [DISCONTINUED] augmented betamethasone dipropionate (DIPROLENE) 0.05 % ointment Apply to affected area after bathing x 4 weeks . Do not use on face or folds.   [DISCONTINUED] calcium carbonate (OSCAL) 1500 (600 Ca) MG TABS tablet Take 600 mg of elemental calcium by mouth daily.   [DISCONTINUED] cholecalciferol (VITAMIN D3) 25 MCG (1000 UNIT) tablet Take 1,000 Units by mouth 2 (two) times daily.   [DISCONTINUED] dapagliflozin propanediol (FARXIGA) 5 MG TABS tablet Take 1 tablet (5 mg total) by mouth daily before breakfast.   [DISCONTINUED] glucose blood (ONETOUCH VERIO) test strip Use to test blood sugar daily. Dx: E11.9   No facility-administered encounter medications on file as of 01/22/2021.    Review of Systems:  Review of Systems Review of Systems  Constitutional: Negative for activity change, appetite change, chills, diaphoresis, fatigue and fever.  HENT: Negative for mouth sores, postnasal drip, rhinorrhea, sinus pain and sore throat.   Respiratory: Negative for apnea, cough, chest tightness, shortness of breath and wheezing.   Cardiovascular: Negative for chest pain, palpitations and leg swelling.  Gastrointestinal: Negative for abdominal distention, abdominal pain, constipation, diarrhea, nausea and vomiting.  Genitourinary: Negative for dysuria and frequency.  Musculoskeletal: Negative for arthralgias, joint swelling and myalgias.  Skin: Negative for rash.  Neurological: Negative for dizziness, syncope, weakness, light-headedness and numbness.  Psychiatric/Behavioral: Negative for behavioral problems, confusion and sleep disturbance.   Health Maintenance  Topic Date Due   Pneumonia Vaccine 14+ Years old (3 - PPSV23 if available, else PCV20) 12/21/2014   Zoster Vaccines- Shingrix (2 of 2) 06/08/2018    FOOT EXAM  04/16/2020   COVID-19 Vaccine (4 - Booster for Moderna series) 04/16/2020   OPHTHALMOLOGY EXAM  07/04/2020   INFLUENZA VACCINE  11/04/2020   HEMOGLOBIN A1C  07/22/2021   TETANUS/TDAP  11/29/2029   DEXA SCAN  Completed   Hepatitis C Screening  Completed   HPV VACCINES  Aged Out    Physical Exam: Vitals:   01/22/21 0902  BP: 136/88  Pulse: 94  Temp: 98 F (36.7 C)  SpO2: 98%  Weight: 112 lb 12.8 oz (51.2 kg)  Height: 5\' 1"  (1.549 m)   Body mass index is 21.31 kg/m. Physical Exam Constitutional: Oriented to person, place, and time. Well-developed and well-nourished.  HENT:  Head: Normocephalic.  Ears TM normal Mouth/Throat: Oropharynx is clear and moist.  Eyes: Pupils are equal, round, and reactive to light.  Neck: Neck supple.  Cardiovascular: Normal  rate and normal heart sounds.  No murmur heard. Pulmonary/Chest: Effort normal and breath sounds normal. No respiratory distress. No wheezes. She has no rales.  Abdominal: Soft. Bowel sounds are normal. No distension. There is no tenderness. There is no rebound.  Musculoskeletal: No edema.  Lymphadenopathy: none Neurological: Alert and oriented to person, place, and time.  Gait stable  Skin: Skin is warm and dry.  Psychiatric: Normal mood and affect. Behavior is normal. Thought content normal.   Labs reviewed: Basic Metabolic Panel: Recent Labs    05/16/20 0000 09/26/20 0000 01/21/21 0000  NA 141 137 143  K 4.3 4.5 4.9  CL 104 101 107  CO2 104* 21 24*  BUN 19 19 22*  CREATININE 0.7 0.7 0.9  CALCIUM 9.1 9.1 9.5  TSH  --  1.67  --    Liver Function Tests: Recent Labs    01/21/21 0000  AST 14  ALT 13  ALKPHOS 87  ALBUMIN 4.1   No results for input(s): LIPASE, AMYLASE in the last 8760 hours. No results for input(s): AMMONIA in the last 8760 hours. CBC: Recent Labs    05/16/20 0000 09/26/20 0000 01/21/21 0000  WBC 8.0 7.3 5.8  HGB 12.6 12.4 12.5  HCT 36 37 38  PLT 533* 381 366   Lipid  Panel: Recent Labs    05/16/20 0000 01/21/21 0000  CHOL 130 118  HDL 56 60  LDLCALC 48 36  TRIG 129 109   Lab Results  Component Value Date   HGBA1C 6.6 01/21/2021    Procedures since last visit: No results found.  Assessment/Plan  Type 2 diabetes mellitus without complication, without long-term current use of insulin (HCC) A1C is good Continue Metformin and Farixga Ophthalmologist in Jan 2023 Can back off on some of her meds If any more drop in her weight or A1C Severe episode of recurrent major depressive disorder, without psychotic features ([place]) Doing better On Pamelor and Wellbutrin Also on Xanax and Ativan Discusses again the side effects Also increased risk of falls Follows with Dr Casimiro Needle GAD (generalized anxiety disorder) Follow with Psych Hyperlipidemia associated with type 2 diabetes mellitus ([place]) LDL good limits Hypertension On Cozaar Cervical Radiculopathy Takes PRN tramadol  Labs/tests ordered:  * No order type specified * Next appt:  Visit date not found

## 2021-01-22 NOTE — Patient Instructions (Addendum)
Start taking Senokot Plus One tablet at night first and then increase to 2 if needed. Add Prune juice in the morning. If all this doesn't help. You can also take Miralax half Cup in the morning to keep you regular  Your due for Mammogram, And DEXA scan Also need Shingrix new Shingles shot.  Kimberly [name] will see you in 4 months

## 2021-05-07 ENCOUNTER — Encounter: Payer: Medicare Other | Admitting: Internal Medicine

## 2021-05-19 ENCOUNTER — Encounter: Payer: Medicare Other | Admitting: Adult Health

## 2021-05-19 ENCOUNTER — Other Ambulatory Visit: Payer: Self-pay

## 2021-05-20 DIAGNOSIS — I10 Essential (primary) hypertension: Secondary | ICD-10-CM | POA: Diagnosis not present

## 2021-05-20 DIAGNOSIS — E119 Type 2 diabetes mellitus without complications: Secondary | ICD-10-CM | POA: Diagnosis not present

## 2021-05-20 LAB — COMPREHENSIVE METABOLIC PANEL: Calcium: 9.3 (ref 8.7–10.7)

## 2021-05-20 LAB — BASIC METABOLIC PANEL
CO2: 27 — AB (ref 13–22)
Chloride: 105 (ref 99–108)
Creatinine: 1 (ref 0.5–1.1)
Glucose: 133
Potassium: 5 (ref 3.4–5.3)
Sodium: 140 (ref 137–147)

## 2021-05-20 LAB — HEMOGLOBIN A1C: Hemoglobin A1C: 6.8

## 2021-05-21 ENCOUNTER — Other Ambulatory Visit: Payer: Self-pay

## 2021-05-21 ENCOUNTER — Non-Acute Institutional Stay: Payer: Medicare Other | Admitting: Internal Medicine

## 2021-05-21 ENCOUNTER — Encounter: Payer: Self-pay | Admitting: Internal Medicine

## 2021-05-21 VITALS — BP 116/88 | HR 101 | Temp 98.1°F | Ht 61.0 in | Wt 115.0 lb

## 2021-05-21 DIAGNOSIS — R1319 Other dysphagia: Secondary | ICD-10-CM | POA: Diagnosis not present

## 2021-05-21 DIAGNOSIS — E119 Type 2 diabetes mellitus without complications: Secondary | ICD-10-CM | POA: Diagnosis not present

## 2021-05-21 DIAGNOSIS — M25551 Pain in right hip: Secondary | ICD-10-CM | POA: Diagnosis not present

## 2021-05-21 DIAGNOSIS — E785 Hyperlipidemia, unspecified: Secondary | ICD-10-CM | POA: Diagnosis not present

## 2021-05-21 DIAGNOSIS — F411 Generalized anxiety disorder: Secondary | ICD-10-CM | POA: Diagnosis not present

## 2021-05-21 DIAGNOSIS — M5412 Radiculopathy, cervical region: Secondary | ICD-10-CM | POA: Diagnosis not present

## 2021-05-21 DIAGNOSIS — E1169 Type 2 diabetes mellitus with other specified complication: Secondary | ICD-10-CM

## 2021-05-21 DIAGNOSIS — F332 Major depressive disorder, recurrent severe without psychotic features: Secondary | ICD-10-CM

## 2021-05-21 NOTE — Progress Notes (Signed)
Location:  Occupational psychologist of Service:     Provider:   Code Status:  Goals of Care:  Advanced Directives 05/21/2021  Does Patient Have a Medical Advance Directive? Yes  Type of Advance Directive [place]  Does patient want to make changes to medical advance directive? No - Patient declined  Copy of [place] in Chart? Yes - validated most recent copy scanned in chart (See row information)  Would patient like information on creating a medical advance directive? -  Pre-existing out of facility DNR order (yellow form or pink MOST form) -     Chief Complaint  Patient presents with   Medical Management of Chronic Issues    Patient returns to the clinic for 4 month follow up.    Quality Metric Gaps    Pneumonia Vaccine 15+ Years old (61) FOOT EXAM (Yearly) COVID-19 Vaccine (4 - Booster for Moderna series) OPHTHALMOLOGY EXAM       HPI: Patient is a 76 y.o. female seen today for medical management of chronic diseases.   Patient has h/o Hypertension, Type 2 Diabetes, and Cervical Radiculopathy   But her main active issue is Anxiety with Major depression Patient lost her husband  here in [place] It was Acute Since then she had been struggling with Anxiety and depression Is seeing Dr Casimiro Needle  Per patient she is doing well with her anxiety and Dr Casimiro Needle was able to taper her Meds some She is eating well and has gained some weight  Was c/o Some Numbness in her feet Also saw Ortho and her Neck injection Says she feels much better since then but now has Right Hip pain and wants that injected  Dysphagia Seems to be the new issue She say she had it many years ago in [facility] and had EGD with dilatation but symptoms are coming back and she feels Food getting stuck in her chest    Past Medical History:  Diagnosis Date   Allergic rhinitis    Allergy    Anemia    as child    Anxiety    Arthritis    Atrophic vaginitis 2017    Cancer ([place])    MOHS nose- skin cancer- basal cell    Cataract    removed both eyes    Depression    Diabetes mellitus without complication ([place])    GERD (gastroesophageal reflux disease)    Heart murmur    as child- outgrown    Hematuria 05/09/2015   had two nonobstructing renal calculi   Hepatitis A 1970s   Hyperlipidemia    Hypertension    Osteopenia    PONV (postoperative nausea and vomiting)    Shingles 06/04/2015   SUI (stress urinary incontinence, female) 2017   Thrombocytopenia ([place])     Past Surgical History:  Procedure Laterality Date   BACK SURGERY  11/2018   CATARACT EXTRACTION W/ INTRAOCULAR LENS  IMPLANT, BILATERAL Bilateral    COLONOSCOPY     KNEE ARTHROSCOPY  2004, 1990s   TONSILLECTOMY AND ADENOIDECTOMY  1952    Allergies  Allergen Reactions   Keflex [Cephalexin]     Outpatient Encounter Medications as of 05/21/2021  Medication Sig   acetaminophen (TYLENOL) 500 MG tablet Take 500 mg by mouth every 6 (six) hours as needed.   ALPRAZolam (XANAX) 0.5 MG tablet TAKE 1/2 TABLET (0.25MG ) BY MOUTH EVERY MORNING, AT NOON AND AT BEDTIME (CONTROL)   atorvastatin (LIPITOR) 20 MG tablet Take  1 tablet (20 mg total) by mouth daily.   buPROPion (WELLBUTRIN XL) 150 MG 24 hr tablet Take 150 mg by mouth daily.   dapagliflozin propanediol (FARXIGA) 5 MG TABS tablet Take 1 tablet (5 mg total) by mouth daily before breakfast.   esomeprazole (NEXIUM) 20 MG capsule Take 20 mg by mouth daily at 12 noon.   glucose blood (ONETOUCH VERIO) test strip Use to test blood sugar daily. Dx: E11.9   ketoconazole (NIZORAL) 2 % cream Apply 1 application topically 2 (two) times daily as needed (groin rash).   LORazepam (ATIVAN) 0.5 MG tablet Take 0.5 mg by mouth 2 (two) times daily.   losartan (COZAAR) 100 MG tablet Take 100 mg by mouth daily.   Magnesium 400 MG CAPS Take by mouth.   metFORMIN (GLUCOPHAGE) 1000 MG tablet TAKE 1 TABLET BY MOUTH  TWICE DAILY WITH  MEALS   Multiple  Vitamins-Minerals (WOMENS 50+ MULTI VITAMIN/MIN PO) Take by mouth.   nortriptyline (PAMELOR) 25 MG capsule Take 50 mg by mouth at bedtime.   vitamin k 100 MCG tablet Take 1 tablet (100 mcg total) by mouth daily.   [DISCONTINUED] LORazepam (ATIVAN) 0.5 MG tablet 1 tab q am, 1 tab at noon, 1 tab midafternoon and 1 tab qhs   [DISCONTINUED] traMADol (ULTRAM) 50 MG tablet Take by mouth every 6 (six) hours as needed.   No facility-administered encounter medications on file as of 05/21/2021.    Review of Systems:  Review of Systems  Constitutional:  Negative for activity change and appetite change.  HENT: Negative.    Respiratory:  Negative for cough and shortness of breath.   Cardiovascular:  Negative for leg swelling.  Gastrointestinal:  Negative for constipation.  Genitourinary: Negative.   Musculoskeletal:  Positive for arthralgias. Negative for gait problem and myalgias.  Skin: Negative.   Neurological:  Negative for dizziness and weakness.  Psychiatric/Behavioral:  Positive for dysphoric mood. Negative for confusion and sleep disturbance. The patient is nervous/anxious.    Health Maintenance  Topic Date Due   Pneumonia Vaccine 93+ Years old (3) 12/21/2014   COVID-19 Vaccine (5 - Booster for Moderna series) 02/19/2021   HEMOGLOBIN A1C  07/22/2021   OPHTHALMOLOGY EXAM  05/05/2022   FOOT EXAM  05/21/2022   TETANUS/TDAP  11/29/2029   INFLUENZA VACCINE  Completed   DEXA SCAN  Completed   Hepatitis C Screening  Completed   Zoster Vaccines- Shingrix  Completed   HPV VACCINES  Aged Out   COLONOSCOPY (Pts 45-12yrs Insurance coverage will need to be confirmed)  Discontinued    Physical Exam: Vitals:   05/21/21 1103  BP: 116/88  Pulse: (!) 101  Temp: 98.1 F (36.7 C)  SpO2: 98%  Weight: 115 lb (52.2 kg)  Height: 5\' 1"  (1.549 m)   Body mass index is 21.73 kg/m. Physical Exam Vitals reviewed.  Constitutional:      Appearance: Normal appearance.  HENT:     Head: Normocephalic.      Nose: Nose normal.     Mouth/Throat:     Mouth: Mucous membranes are moist.     Pharynx: Oropharynx is clear.  Eyes:     Pupils: Pupils are equal, round, and reactive to light.  Cardiovascular:     Rate and Rhythm: Normal rate and regular rhythm.     Pulses: Normal pulses.     Heart sounds: Normal heart sounds. No murmur heard. Pulmonary:     Effort: Pulmonary effort is normal.     Breath sounds:  Normal breath sounds.  Abdominal:     General: Abdomen is flat. Bowel sounds are normal.     Palpations: Abdomen is soft.  Musculoskeletal:        General: No swelling.     Cervical back: Neck supple.     Comments: Focal Tenderness in Right Hip area Hip Joint Moving well with no tenderness  Foot Exam done No Sensory loss Skin intact with no Open Wounds Good Pulses  Skin:    General: Skin is warm.  Neurological:     General: No focal deficit present.     Mental Status: She is alert and oriented to person, place, and time.  Psychiatric:        Mood and Affect: Mood normal.        Thought Content: Thought content normal.    Labs reviewed: Basic Metabolic Panel: Recent Labs    09/26/20 0000 01/21/21 0000  NA 137 143  K 4.5 4.9  CL 101 107  CO2 21 24*  BUN 19 22*  CREATININE 0.7 0.9  CALCIUM 9.1 9.5  TSH 1.67  --    Liver Function Tests: Recent Labs    01/21/21 0000  AST 14  ALT 13  ALKPHOS 87  ALBUMIN 4.1   No results for input(s): LIPASE, AMYLASE in the last 8760 hours. No results for input(s): AMMONIA in the last 8760 hours. CBC: Recent Labs    09/26/20 0000 01/21/21 0000  WBC 7.3 5.8  HGB 12.4 12.5  HCT 37 38  PLT 381 366   Lipid Panel: Recent Labs    01/21/21 0000  CHOL 118  HDL 60  LDLCALC 36  TRIG 109   Lab Results  Component Value Date   HGBA1C 6.6 01/21/2021    Procedures since last visit: No results found.  Assessment/Plan 1. Esophageal dysphagia Already ion Nexium - Ambulatory referral to Gastroenterology  2. Type 2  diabetes mellitus without complication, without long-term current use of insulin ([place]) Foot Exam done today Saw Ophthalmologist few weeks ago JAN 23 A1C good 6.8 in 02/23 On Metformin and Farxiga 3. GAD (generalized anxiety disorder) Follows with Dr Casimiro Needle On Ativan Xanax and Wellbutrin  4. Hyperlipidemia associated with type 2 diabetes mellitus (HCC) Continue statin LDL 36 Will repeat if that low can back off on Lipitor dose  5. Severe episode of recurrent major depressive disorder, without psychotic features ([place]) On Wellbutrin and Pamelor to help her sleep  6. Right hip pain Has follow up with Ortho  7. Cervical radiculopathy Doing well 8 Hypertension On Cozaar    Labs/tests ordered:  * No order type specified * Next appt:  Visit date not found

## 2021-05-26 ENCOUNTER — Encounter: Payer: Medicare Other | Admitting: Adult Health

## 2021-05-29 DIAGNOSIS — M7061 Trochanteric bursitis, right hip: Secondary | ICD-10-CM | POA: Diagnosis not present

## 2021-05-29 DIAGNOSIS — M5412 Radiculopathy, cervical region: Secondary | ICD-10-CM | POA: Diagnosis not present

## 2021-06-09 DIAGNOSIS — Z6820 Body mass index (BMI) 20.0-20.9, adult: Secondary | ICD-10-CM | POA: Diagnosis not present

## 2021-06-09 DIAGNOSIS — M5412 Radiculopathy, cervical region: Secondary | ICD-10-CM | POA: Diagnosis not present

## 2021-06-09 DIAGNOSIS — M5416 Radiculopathy, lumbar region: Secondary | ICD-10-CM | POA: Diagnosis not present

## 2021-06-09 DIAGNOSIS — M545 Low back pain, unspecified: Secondary | ICD-10-CM | POA: Diagnosis not present

## 2021-06-10 DIAGNOSIS — Q6689 Other  specified congenital deformities of feet: Secondary | ICD-10-CM | POA: Diagnosis not present

## 2021-06-10 DIAGNOSIS — E1142 Type 2 diabetes mellitus with diabetic polyneuropathy: Secondary | ICD-10-CM | POA: Diagnosis not present

## 2021-06-13 ENCOUNTER — Other Ambulatory Visit: Payer: Self-pay

## 2021-06-16 ENCOUNTER — Encounter: Payer: Self-pay | Admitting: Nurse Practitioner

## 2021-06-16 ENCOUNTER — Ambulatory Visit: Payer: Medicare Other | Admitting: Nurse Practitioner

## 2021-06-16 VITALS — BP 120/68 | HR 111 | Ht 59.0 in | Wt 112.6 lb

## 2021-06-16 DIAGNOSIS — R634 Abnormal weight loss: Secondary | ICD-10-CM

## 2021-06-16 DIAGNOSIS — R1310 Dysphagia, unspecified: Secondary | ICD-10-CM

## 2021-06-16 DIAGNOSIS — Z800 Family history of malignant neoplasm of digestive organs: Secondary | ICD-10-CM | POA: Diagnosis not present

## 2021-06-16 DIAGNOSIS — K219 Gastro-esophageal reflux disease without esophagitis: Secondary | ICD-10-CM

## 2021-06-16 MED ORDER — NA SULFATE-K SULFATE-MG SULF 17.5-3.13-1.6 GM/177ML PO SOLN: 1.0000 | ORAL | 0 refills | Status: DC

## 2021-06-16 MED ORDER — PANTOPRAZOLE SODIUM 40 MG PO TBEC: 40.0000 mg | DELAYED_RELEASE_TABLET | Freq: Every day | ORAL | 2 refills | Status: DC

## 2021-06-16 NOTE — Patient Instructions (Signed)
You have been scheduled for an EGD and Colonoscopy. Please follow the written instructions given to you at your visit today. ?Please pick up your prep supplies at the pharmacy within the next 1-3 days. ?If you use inhalers (even only as needed), please bring them with you on the day of your procedure. ? ?RECOMMENDATIONS: ? ?Stop Nexium. ?Start Pantoprazole 40 MG once a day. This has been sent to your pharmacy. ?Drink 1-2 cans of Ensure or Boost daily. ? ?Thank you for trusting me with your gastrointestinal care!   ? ?Noralyn Pick, CRNP ? ? ? ?BMI: ? ?If you are age 19 or older, your body mass index should be between 23-30. Your Body mass index is 22.74 kg/m?Marland Kitchen If this is out of the aforementioned range listed, please consider follow up with your Primary Care Provider. ? ?If you are age 59 or younger, your body mass index should be between 19-25. Your Body mass index is 22.74 kg/m?Marland Kitchen If this is out of the aformentioned range listed, please consider follow up with your Primary Care Provider.  ? ?MY CHART: ? ?The [facility] providers would like to encourage you to use [facility] to communicate with providers for non-urgent requests or questions.  Due to long hold times on the telephone, sending your provider a message by [facility] may be a faster and more efficient way to get a response.  Please allow 48 business hours for a response.  Please remember that this is for non-urgent requests.  ? ?

## 2021-06-16 NOTE — Progress Notes (Deleted)
Patient left without getting her procedure instructions or AVS due to her ride being here. ?

## 2021-06-16 NOTE — Progress Notes (Signed)
06/16/2021 Kimberly [name] [id number] October 13, 1945   CHIEF COMPLAINT: Acid reflux, food gets stuck in the mid esophagus   HISTORY OF PRESENT ILLNESS: Jocie Meroney. Geurin is a 76 year old female with a past medical history of anxiety, depression, hypertension, hyperlipidemia, diabetes mellitus type 2, arthritis, cervical radiculopathy and dysphagia.  She presents to our office today as referred by Dr. Veleta Miners for further evaluation regarding dysphagia.  She complains of having heartburn most days and intermittent dysphagia for the past 10 months which started after her husband died.  She continues to grieve the loss of her husband.  She has frequent heartburn, no specific food triggers.  She describes having food which gets stuck to the upper mid esophagus which sits there for 2 to 3 hours which sometimes eventually passes down into the stomach and other times she self induces vomiting to expel the stuck food.  She is able to drink sips of water during this time without the water backing up.  She started taking Nexium 20 mg daily a few weeks ago without improvement.  She is taking Gaviscon liquid which is comforting.  She denies having any upper or lower abdominal pain.  She reported undergoing an EGD 25 to 30 years ago due to having similar difficulty swallowing which resolved after her esophagus was stretched.  She typically passes a normal formed bowel movement most days.  She ate eggs salad last week which resulted in a day of diarrhea which abated.  No rectal bleeding or black stools.  She has lost 30 pounds over the past 6 to 12 months.  She is highly anxious.  She lives alone in independent living.  She underwent a screening colonoscopy by Dr. Yong Channel in [facility] 09/29/2013 which was reported as normal.  She was advised to repeat a colonoscopy in 5 years due a positive family history of colon cancer in a first-degree relative.  She verified her brother was diagnosed with  colon cancer in his 33s.  She has not been seen in our office previously, however, a direct colonoscopy was considered 09/2019.  At that time, Dr. Loletha Carrow reviewed her colonoscopy 09/29/2013 records and he recommended for the patient to schedule colonoscopy if she indeed had a first-degree relative with colon cancer if not no further colon screening colonoscopies were recommended due to age.   She completed a Cologuard test 02/10/2020 which was negative.   CBC Latest Ref Rng & Units 01/21/2021 09/26/2020 05/16/2020  WBC - 5.8 7.3 8.0  Hemoglobin 12.0 - 16.0 12.5 12.4 12.6  Hematocrit 36 - 46 38 37 36  Platelets 150 - 399 366 381 533(A)    CMP Latest Ref Rng & Units 05/20/2021 01/21/2021 09/26/2020  BUN 4 - 21 - 22(A) 19  Creatinine 0.5 - 1.1 1.0 0.9 0.7  Sodium 137 - 147 140 143 137  Potassium 3.4 - 5.3 5.0 4.9 4.5  Chloride 99 - 108 105 107 101  CO2 13 - 22 27(A) 24(A) 21  Calcium 8.7 - 10.7 9.3 9.5 9.1  Alkaline Phos 25 - 125 - 87 -  AST 13 - 35 - 14 -  ALT 7 - 35 - 13 -    Past Medical History:  Diagnosis Date   Allergic rhinitis    Allergy    Anemia    as child    Anxiety    Arthritis    Atrophic vaginitis 2017   Cancer (HCC)    MOHS nose- skin cancer- basal  cell    Cataract    removed both eyes    Depression    Diabetes mellitus without complication ([place])    GERD (gastroesophageal reflux disease)    Heart murmur    as child- outgrown    Hematuria 05/09/2015   had two nonobstructing renal calculi   Hepatitis A 1970s   Hyperlipidemia    Hypertension    Osteopenia    PONV (postoperative nausea and vomiting)    Shingles 06/04/2015   SUI (stress urinary incontinence, female) 2017   Thrombocytopenia [facility])    Past Surgical History:  Procedure Laterality Date   BACK SURGERY  11/2018   CATARACT EXTRACTION W/ INTRAOCULAR LENS  IMPLANT, BILATERAL Bilateral    COLONOSCOPY     KNEE ARTHROSCOPY  2004, [place]   Social History: She is  widowed.  She has 3 sons.  Non-smoker.  No alcohol use.  Limited caffeine intake.  Family History: Brother was diagnosed with colon cancer in his 47s.  Sister with history of colon polyps.  Father with history of heart disease and stroke.  Allergies  Allergen Reactions   Keflex [Cephalexin]       Outpatient Encounter Medications as of 06/16/2021  Medication Sig   acetaminophen (TYLENOL) 500 MG tablet Take 500 mg by mouth every 6 (six) hours as needed.   ALPRAZolam (XANAX) 0.5 MG tablet TAKE 1/2 TABLET (0.'25MG'$ ) BY MOUTH EVERY MORNING, AT NOON AND AT BEDTIME (CONTROL)   atorvastatin (LIPITOR) 20 MG tablet Take 1 tablet (20 mg total) by mouth daily.   buPROPion (WELLBUTRIN XL) 150 MG 24 hr tablet Take 150 mg by mouth daily.   dapagliflozin propanediol (FARXIGA) 5 MG TABS tablet Take 1 tablet (5 mg total) by mouth daily before breakfast.   glucose blood (ONETOUCH VERIO) test strip Use to test blood sugar daily. Dx: E11.9   ketoconazole (NIZORAL) 2 % cream Apply 1 application topically 2 (two) times daily as needed (groin rash).   LORazepam (ATIVAN) 0.5 MG tablet Take 0.5 mg by mouth 2 (two) times daily.   losartan (COZAAR) 100 MG tablet Take 100 mg by mouth daily.   Magnesium 400 MG CAPS Take by mouth.   metFORMIN (GLUCOPHAGE) 1000 MG tablet TAKE 1 TABLET BY MOUTH  TWICE DAILY WITH  MEALS   Multiple Vitamins-Minerals (WOMENS 50+ MULTI VITAMIN/MIN PO) Take by mouth.   nortriptyline (PAMELOR) 25 MG capsule Take 50 mg by mouth at bedtime.   vitamin k 100 MCG tablet Take 1 tablet (100 mcg total) by mouth daily.   No facility-administered encounter medications on file as of 06/16/2021.    REVIEW OF SYSTEMS:  Gen: + 30 lb weigh loss. Denies fever, sweats or chills.  CV: Denies chest pain, palpitations or edema. Resp: Denies cough, shortness of breath of hemoptysis.  GI: See HPI. GU : Denies urinary burning, blood in urine, increased urinary frequency or incontinence. MS: Denies joint pain,  muscles aches or weakness. Derm: Denies rash, itchiness, skin lesions or unhealing ulcers. Psych: + Anxiety an depression.  Heme: Denies bruising, easy bleeding. Neuro:  Denies headaches, dizziness or paresthesias. Endo:  + DM II.   PHYSICAL EXAM: BP 120/68    Pulse (!) 111    Ht '4\' 11"'$  (1.499 m)    Wt 112 lb 9.6 oz (51.1 kg)    SpO2 97%    BMI 22.74 kg/m   General: 76 year old female anxious, thin in no acute distress. Head: Normocephalic and atraumatic. Eyes:  Sclerae non-icteric, conjunctive pink. Ears: Normal auditory acuity. Mouth: Dentition intact. No ulcers or lesions.  Neck: Supple, no lymphadenopathy or thyromegaly.  Lungs: Clear bilaterally to auscultation without wheezes, crackles or rhonchi. Heart: Regular rate and rhythm. No murmur, rub or gallop appreciated.  Abdomen: Soft, nontender, non distended. No masses. No hepatosplenomegaly. Normoactive bowel sounds x 4 quadrants.  Rectal: Deferred. Musculoskeletal: Symmetrical with no gross deformities. Skin: Warm and dry. No rash or lesions on visible extremities. Extremities: No edema. Neurological: Alert oriented x 4, no focal deficits.  Psychological:  Alert and cooperative. Normal mood and affect.  ASSESSMENT AND PLAN:  60) 76 year old female with GERD and dysphagia.  Remote history of EGD with esophageal dilatation. -EGD with possible esophageal dilatation benefits and risks discussed including risk with sedation, risk of bleeding, perforation and infection  -Stop Nexium OTC -Pantoprazole 40 mg 1 p.o. daily -Patient instructed to avoid eating large pieces of meat, dry bread or rice.  Cut food into small pieces, chew food thoroughly.  2) Colon cancer screening.  Brother with history of colon cancer diagnosed in his 53s.  History of possibly had colon polyps.  Normal colonoscopy 09/2013.  Negative Cologuard test 02/2020. -Colonoscopy benefits and risks discussed including risk with sedation, risk of bleeding, perforation  and infection   3) Weight loss secondary dysphagia, grieving process and anxiety  -EGD and colonoscopy as ordered above -Recommended Ensure or boost 1 to 2 cans daily -Consider chest/abdominal/pelvic CT if EGD and colonoscopy unrevealing  Further recommendations to be determined after the above evaluation completed      CC:  Virgie Dad, MD

## 2021-06-24 ENCOUNTER — Other Ambulatory Visit: Payer: Self-pay

## 2021-06-24 MED ORDER — MAGNESIUM 400 MG PO CAPS: 1.0000 | ORAL_CAPSULE | Freq: Every day | ORAL | 3 refills | Status: DC

## 2021-06-24 NOTE — Telephone Encounter (Signed)
Incoming call received from Spencer with [employer] requesting refill for Magnesium to be sent to [facility] versus [facility]. I asked if [place] should be removed from patients account and was advised to leave it as a back-up (in the event that patient needs something short-term)  ? ?I asked if patients has received magnesium as a rx in the past as it is an OTC supplement, and Heather replied yes- she gets it as a rx. ? ?RX sent as requested.  ?

## 2021-06-24 NOTE — Progress Notes (Signed)
____________________________________________________________ ? ?Attending physician addendum: ? ?Thank you for sending this case to me. ?I have reviewed the entire note and agree with the plan. ? ?She is scheduled with me on April 28, most likely because that was the next available date for double length slot to do EGD and colonoscopy. ?Although she had a negative Cologuard test in 2021, a family history of colorectal cancer in a first-degree relative would be screened better with colonoscopy. ? ?I am particularly concerned about the dysphagia and weight loss.  If she wishes to proceed with the upper endoscopy alone at a sooner date, then that may be feasible with more single slot appointments available.  Depending on the day, I might also be able to make accommodations to start earlier. ? ?[place], please contact this patient and ask if she would like to do the EGD sooner, even if that means coming back at a later date for the colonoscopy. ? ?Wilfrid Lund, MD ? ?____________________________________________________________ ? ?

## 2021-06-25 ENCOUNTER — Telehealth: Payer: Self-pay

## 2021-06-25 NOTE — Telephone Encounter (Addendum)
____________________________________________________________ ?  ?Attending physician addendum: ?  ?Thank you for sending this case to me. ?I have reviewed the entire note and agree with the plan. ?  ?She is scheduled with me on April 28, most likely because that was the next available date for double length slot to do EGD and colonoscopy. ?Although she had a negative Cologuard test in 2021, a family history of colorectal cancer in a first-degree relative would be screened better with colonoscopy. ?  ?I am particularly concerned about the dysphagia and weight loss.  If she wishes to proceed with the upper endoscopy alone at a sooner date, then that may be feasible with more single slot appointments available.  Depending on the day, I might also be able to make accommodations to start earlier. ?  ?Shemica Meath, please contact this patient and ask if she would like to do the EGD sooner, even if that means coming back at a later date for the colonoscopy. ?  ?Wilfrid Lund, MD ? ?----- Message from [place], MD sent at 06/24/2021  5:18 PM EDT ----- ? ? ? ?----- Message ----- ?From: Noralyn Pick, NP ?Sent: 06/16/2021   5:16 PM EDT ?To: Doran Stabler, MD ? ?Dr. Loletha Carrow, the patient was anxious but she wishes to proceed with an EGD and colonoscopy at the age of 54.  Let me know if you have other recommendations.  Thank you ?

## 2021-06-25 NOTE — Progress Notes (Signed)
Attempted to reach pt, her phone goes straight to vm. I left a vm for patient to return call. ?

## 2021-06-25 NOTE — Telephone Encounter (Signed)
Attempted to reach pt, her phone goes straight to vm. I left a vm for patient to return call. ?

## 2021-06-27 NOTE — Telephone Encounter (Signed)
2nd attempt to reach pt. Pt's phone goes straight to vm. I was able to leave a vm for pt to return call. ? ?My chart message sent to patient as well. ?

## 2021-06-27 NOTE — Telephone Encounter (Signed)
Pt has responded. See 06/27/21 my chart message. ?

## 2021-06-27 NOTE — Telephone Encounter (Signed)
I could do it on Thursday, April 6 at 7:30 AM. ?Please let me know what she decides so I can plan accordingly. ? ?- HD ?

## 2021-06-27 NOTE — Telephone Encounter (Signed)
Dr. Loletha Carrow, your next available single slot appt is not until Thursday, 07/17/21. Would you like to do an early morning case sooner? Please advise, thanks. ?

## 2021-06-30 ENCOUNTER — Telehealth: Payer: Self-pay | Admitting: Gastroenterology

## 2021-06-30 DIAGNOSIS — K219 Gastro-esophageal reflux disease without esophagitis: Secondary | ICD-10-CM

## 2021-06-30 DIAGNOSIS — R1310 Dysphagia, unspecified: Secondary | ICD-10-CM

## 2021-06-30 DIAGNOSIS — R634 Abnormal weight loss: Secondary | ICD-10-CM

## 2021-06-30 NOTE — Addendum Note (Signed)
Addended by: Yevette Edwards on: 06/30/2021 11:02 AM ? ? Modules accepted: Orders ? ?

## 2021-06-30 NOTE — Telephone Encounter (Signed)
See 06/30/21 telephone encounter for details ?

## 2021-06-30 NOTE — Telephone Encounter (Signed)
Patient arrived at the office today to discuss sooner EGD. Per previous patient message Dr. Loletha Carrow is able to perform her endoscopy on Thursday, 07/10/21 at 7:30 am. Pt is aware that she will need to arrive in the [facility] by 7 am with a care partner. Pt is aware that I will send her endoscopy instructions via my chart. Pt verbalized understanding and had no other concerns.  ? ?I sent a message to [facility] to let her know about appt change.   ?

## 2021-07-07 ENCOUNTER — Telehealth: Payer: Self-pay

## 2021-07-07 NOTE — Telephone Encounter (Signed)
Kimberly [name] from the front desk at [facility] states patient called stating Dr. Pincus Sanes scheduled her colonoscopy for the 28th and wants to be sure its neccessary since she did cologaurd last year.  ?To Dr. Lyndel Safe ?

## 2021-07-08 ENCOUNTER — Telehealth: Payer: Self-pay | Admitting: *Deleted

## 2021-07-08 NOTE — Telephone Encounter (Signed)
[facility] Nurse with [facility], called and stated that patient came to her office and stated that she has a Endoscopy scheduled for 07/10/2021 and a Colonoscopy scheduled for 08/01/2021. ? ?Patient DOES NOT want the Colonoscopy due to her age she doesn't think she needs it done.  ? ?Please Advise.  ?

## 2021-07-08 NOTE — Telephone Encounter (Signed)
Tell her that she has to discuss this with Dr Simona Huh and tell him that she has already had Negative cologuard a year ago. ?I cant make that decision for her. ?

## 2021-07-08 NOTE — Telephone Encounter (Signed)
DWP, no further questions. ?

## 2021-07-09 ENCOUNTER — Telehealth: Payer: Self-pay | Admitting: Gastroenterology

## 2021-07-09 NOTE — Telephone Encounter (Signed)
Patient called to state she does not wish to proceed with a colonoscopy at this time.  Had recently lost her husband and does not wish to proceed.  She states she had Cologuard last year that was negative, has never had any polyps in the past.  She is aware that she has a hx of colon cancer in her brother, but states that was 30 years ago.  Procedure has been cancelled.   ?

## 2021-07-10 ENCOUNTER — Encounter: Payer: Self-pay | Admitting: Internal Medicine

## 2021-07-10 ENCOUNTER — Encounter: Payer: Medicare Other | Admitting: Gastroenterology

## 2021-07-10 ENCOUNTER — Ambulatory Visit (AMBULATORY_SURGERY_CENTER): Payer: Medicare Other | Admitting: Internal Medicine

## 2021-07-10 VITALS — BP 106/53 | HR 76 | Temp 96.9°F | Resp 12 | Ht 59.0 in | Wt 112.0 lb

## 2021-07-10 DIAGNOSIS — K222 Esophageal obstruction: Secondary | ICD-10-CM | POA: Diagnosis not present

## 2021-07-10 DIAGNOSIS — K317 Polyp of stomach and duodenum: Secondary | ICD-10-CM

## 2021-07-10 DIAGNOSIS — R1310 Dysphagia, unspecified: Secondary | ICD-10-CM

## 2021-07-10 DIAGNOSIS — R12 Heartburn: Secondary | ICD-10-CM | POA: Diagnosis not present

## 2021-07-10 DIAGNOSIS — K449 Diaphragmatic hernia without obstruction or gangrene: Secondary | ICD-10-CM

## 2021-07-10 DIAGNOSIS — K219 Gastro-esophageal reflux disease without esophagitis: Secondary | ICD-10-CM

## 2021-07-10 MED ORDER — SODIUM CHLORIDE 0.9 % IV SOLN: 500.0000 mL | Freq: Once | INTRAVENOUS | Status: DC

## 2021-07-10 NOTE — Op Note (Signed)
[place] ?Patient Name: Kimberly [name] ?Procedure Date: 07/10/2021 9:54 AM ?MRN: [id number] ?Endoscopist: Gatha Mayer , MD ?Age: 76 ?Referring MD:  ?Date of Birth: 1945-08-06 ?Gender: Female ?Account #: [id number] ?Procedure:                Upper GI endoscopy ?Indications:              Dysphagia, Heartburn ?Medicines:                Monitored Anesthesia Care ?Procedure:                Pre-Anesthesia Assessment: ?                          - Prior to the procedure, a History and Physical  ?                          was performed, and patient medications and  ?                          allergies were reviewed. The patient's tolerance of  ?                          previous anesthesia was also reviewed. The risks  ?                          and benefits of the procedure and the sedation  ?                          options and risks were discussed with the patient.  ?                          All questions were answered, and informed consent  ?                          was obtained. Prior Anticoagulants: The patient has  ?                          taken no previous anticoagulant or antiplatelet  ?                          agents. ASA Grade Assessment: II - A patient with  ?                          mild systemic disease. After reviewing the risks  ?                          and benefits, the patient was deemed in  ?                          satisfactory condition to undergo the procedure. ?                          After obtaining informed consent, the endoscope was  ?  passed under direct vision. Throughout the  ?                          procedure, the patient's blood pressure, pulse, and  ?                          oxygen saturations were monitored continuously. The  ?                          GIF [id number] #[id number] was introduced through the  ?                          mouth, and advanced to the second part of duodenum.  ?                          The upper GI endoscopy was accomplished  without  ?                          difficulty. The patient tolerated the procedure  ?                          well. ?Scope In: ?Scope Out: ?Findings:                 One benign-appearing, intrinsic moderate  ?                          (circumferential scarring or stenosis; an endoscope  ?                          may pass) stenosis was found at the  ?                          gastroesophageal junction. This stenosis measured  ?                          less than one cm (in length). The stenosis was  ?                          traversed. A TTS dilator was passed through the  ?                          scope. Dilation with a 16-17-18 mm balloon and an  ?                          18-19-20 mm balloon dilator was performed to 20 mm.  ?                          The dilation site was examined and showed minimal  ?                          tear of stricture so biopsy forceps used to disrupt  ?  more. Estimated blood loss was minimal. Esophagus  ?                          was also somewhat tortuous. ?                          The gastroesophageal flap valve was visualized  ?                          endoscopically and classified as Hill Grade IV (no  ?                          fold, wide open lumen, hiatal hernia present). ?                          A 5 cm (30-35 cm) hiatal hernia was present. ?                          A few diminutive semi-sessile polyps were found in  ?                          the gastric fundus. ?                          The exam was otherwise without abnormality. ?                          The cardia and gastric fundus were otherwise normal  ?                          on retroflexion. ?Complications:            No immediate complications. ?Estimated Blood Loss:     Estimated blood loss was minimal. ?Impression:               - Benign-appearing esophageal stenosis. Dilated. ?                          - Gastroesophageal flap valve classified as Hill  ?                           Grade IV (no fold, wide open lumen, hiatal hernia  ?                          present). ?                          - 5 cm (30-35 cm) hiatal hernia. ?                          - A few gastric polyps. Classic appearance of  ?                          benign fundic gland polyps. ?                          -  The examination was otherwise normal except for  ?                          somewhat tortuous esophagus. ? some dysmotility. ?                          - No specimens collected. ?Recommendation:           - Patient has a contact number available for  ?                          emergencies. The signs and symptoms of potential  ?                          delayed complications were discussed with the  ?                          patient. Return to normal activities tomorrow.  ?                          Written discharge instructions were provided to the  ?                          patient. ?                          - Clear liquids x 1 hour then soft foods rest of  ?                          day. Start prior diet tomorrow. ?                          - Continue present medications. Stay on recently  ?                          prescribed pantoprazole. ?                          - Follow an antireflux regimen. This includes: ?                          - Do not lie down for at least 3 to 4 hours after  ?                          meals. ?                          - Raise the head of the bed 4 to 6 inches. ?                          - Decrease excess weight. ?                          - Avoid citrus juices and other acidic foods,  ?  alcohol, chocolate, mints, coffee and other  ?                          caffeinated beverages, carbonated beverages, fatty  ?                          and fried foods. ?                          - Avoid tight-fitting clothing. ?                          - Avoid cigarettes and other tobacco products. ?                          - Schedule f/u w/ Jehan Ranganathan Best or Dr.   ?                          Danis in about 4-6 weeks ?Gatha Mayer, MD ?07/10/2021 10:21:25 AM ?This report has been signed electronically. ?

## 2021-07-10 NOTE — Progress Notes (Signed)
Pt in recovery with monitors in place, VSS. Report given to receiving RN. Bite guard was placed with pt awake to ensure comfort. No dental or soft tissue damage noted. 

## 2021-07-10 NOTE — Patient Instructions (Addendum)
Handouts given for hiatal hernia, stricture and dilation diet. ? ?YOU HAD AN ENDOSCOPIC PROCEDURE TODAY AT [facility]:   Refer to the procedure report that was given to you for any specific questions about what was found during the examination.  If the procedure report does not answer your questions, please call your gastroenterologist to clarify.  If you requested that your care partner not be given the details of your procedure findings, then the procedure report has been included in a sealed envelope for you to review at your convenience later. ? ?YOU SHOULD EXPECT: Some feelings of bloating in the abdomen. Passage of more gas than usual.  Walking can help get rid of the air that was put into your GI tract during the procedure and reduce the bloating. If you had a lower endoscopy (such as a colonoscopy or flexible sigmoidoscopy) you may notice spotting of blood in your stool or on the toilet paper. If you underwent a bowel prep for your procedure, you may not have a normal bowel movement for a few days. ? ?Please Note:  You might notice some irritation and congestion in your nose or some drainage.  This is from the oxygen used during your procedure.  There is no need for concern and it should clear up in a day or so. ? ?SYMPTOMS TO REPORT IMMEDIATELY: ? ?Following upper endoscopy (EGD) ? Vomiting of blood or coffee ground material ? New chest pain or pain under the shoulder blades ? Painful or persistently difficult swallowing ? New shortness of breath ? Fever of 100?F or higher ? Black, tarry-looking stools ? ?For urgent or emergent issues, a gastroenterologist can be reached at any hour by calling [phone]. ?Do not use MyChart messaging for urgent concerns.  ? ? ?DIET:  We do recommend a small meal at first, but then you may proceed to your regular diet.  Drink plenty of fluids but you should avoid alcoholic beverages for 24 hours. ? ?ACTIVITY:  You should plan to take it easy for the  rest of today and you should NOT DRIVE or use heavy machinery until tomorrow (because of the sedation medicines used during the test).   ? ?FOLLOW UP: ?Our staff will call the number listed on your records 48-72 hours following your procedure to check on you and address any questions or concerns that you may have regarding the information given to you following your procedure. If we do not reach you, we will leave a message.  We will attempt to reach you two times.  During this call, we will ask if you have developed any symptoms of COVID 19. If you develop any symptoms (ie: fever, flu-like symptoms, shortness of breath, cough etc.) before then, please call [phone].  If you test positive for Covid 19 in the 2 weeks post procedure, please call and report this information to [place].   ? ?If any biopsies were taken you will be contacted by phone or by letter within the next 1-3 weeks.  Please call us at [phone] if you have not heard about the biopsies in 3 weeks.  ? ? ?SIGNATURES/CONFIDENTIALITY: ?You and/or your care partner have signed paperwork which will be entered into your electronic medical record.  These signatures attest to the fact that that the information above on your After Visit Summary has been reviewed and is understood.  Full responsibility of the confidentiality of this discharge information lies with you and/or your care-partner. There was a narrow area  called a stricture where the esophagus and stomach meet. I believe this is scar tissue from acid reflux and food stops here. I opened it up so it should help you swallow better. ? ?Stay on pantoprazole to reduce acid and treat heartburn, this can reduce chance of stricture becoming a problem again. ? ?There was a hiatal hernia (read handout) and a few innocent polyps. ? ?We will arrange a follow-up visit in the office to review and make sure you are doing better. You will be called about this next week. ? ?This includes:  ?    - Do not lie  down for at least 3 to 4 hours after meals.   ?    - Raise the head of the bed 4 to 6 inches.    ?    - Avoid citrus juices and other acidic foods, alcohol, chocolate, mints, coffee and other caffeinated beverages, carbonated beverages, fatty and fried foods.   ?    - Avoid tight-fitting clothing.   ?    - Avoid cigarettes and other tobacco products.  ? ?I appreciate the opportunity to care for you. ?Gatha Mayer, MD, [facility] ? ?

## 2021-07-10 NOTE — Progress Notes (Signed)
History and Physical Interval Note: ? ?07/10/2021 ?9:58 AM ? ?JOUD PETTINATO  has presented today for endoscopic procedure(s), with the diagnosis of  ?Encounter Diagnosis  ?Name Primary?  ? Dysphagia, unspecified type Yes  ?Marland Kitchen  The various methods of evaluation and treatment have been discussed with the patient and/or family. After consideration of risks, benefits and other options for treatment, the patient has consented to  the endoscopic procedure(s). ? ? The patient's history has been reviewed, patient examined, no change in status, stable for endoscopic procedure(s).  I have reviewed the patient's chart and labs.  Questions were answered to the patient's satisfaction.   ? ? ?Gatha Mayer, MD, [facility] ? ?

## 2021-07-10 NOTE — Progress Notes (Signed)
Cell phone off per pt  

## 2021-07-10 NOTE — Telephone Encounter (Signed)
Thank you for the note, Yesi. ? ?Glendell Docker,  I think this patient was put on your schedule while I am out of the office this week. ? ?- HD ?

## 2021-07-10 NOTE — Progress Notes (Signed)
Called to room to assist during endoscopic procedure.  Patient ID and intended procedure confirmed with present staff. Received instructions for my participation in the procedure from the performing physician.  

## 2021-07-13 ENCOUNTER — Encounter: Payer: Self-pay | Admitting: Internal Medicine

## 2021-07-13 DIAGNOSIS — R634 Abnormal weight loss: Secondary | ICD-10-CM

## 2021-07-13 DIAGNOSIS — R112 Nausea with vomiting, unspecified: Secondary | ICD-10-CM

## 2021-07-14 ENCOUNTER — Telehealth: Payer: Self-pay

## 2021-07-14 ENCOUNTER — Other Ambulatory Visit: Payer: Self-pay | Admitting: Internal Medicine

## 2021-07-14 DIAGNOSIS — M5412 Radiculopathy, cervical region: Secondary | ICD-10-CM | POA: Diagnosis not present

## 2021-07-14 DIAGNOSIS — M5416 Radiculopathy, lumbar region: Secondary | ICD-10-CM | POA: Diagnosis not present

## 2021-07-14 MED ORDER — ONDANSETRON HCL 4 MG PO TABS: 4.0000 mg | ORAL_TABLET | Freq: Four times a day (QID) | ORAL | 1 refills | Status: DC | PRN

## 2021-07-14 NOTE — Telephone Encounter (Signed)
Per recent EGD recommendations pt was scheduled for a follow up appointment to see Dr. Loletha Carrow on 08/28/2021 at 10:40: ?Left message for pt to call back:  ?

## 2021-07-15 ENCOUNTER — Telehealth: Payer: Self-pay | Admitting: *Deleted

## 2021-07-15 ENCOUNTER — Telehealth: Payer: Self-pay

## 2021-07-15 NOTE — Telephone Encounter (Signed)
Left message for pt to call back  °

## 2021-07-15 NOTE — Telephone Encounter (Signed)
?  Follow up Call- ? ? ?  07/10/2021  ?  8:45 AM  ?Call back number  ?Post procedure Call Back phone  # [phone]  ?Permission to leave phone message Yes  ?  ? ? ?2nd follow up call made.  NALM ?

## 2021-07-15 NOTE — Telephone Encounter (Signed)
Attempted to call patient for their post-procedure follow-up call. No answer. Left voicemail.   

## 2021-07-15 NOTE — Telephone Encounter (Signed)
Colleen, ? ?  She has an appointment with me on 08/28/21 to follow-up after her recent upper endoscopy with dilation performed by Dr. Carlean Purl. ? ?- HD ?

## 2021-07-16 NOTE — Telephone Encounter (Signed)
Left message for pt to call back  °

## 2021-07-17 ENCOUNTER — Encounter: Payer: Medicare Other | Admitting: Gastroenterology

## 2021-07-17 NOTE — Telephone Encounter (Signed)
Left message for pt to call back  °

## 2021-07-18 NOTE — Telephone Encounter (Signed)
Left message for pt to call back  °

## 2021-07-21 NOTE — Telephone Encounter (Signed)
Left message for pt to call back  °

## 2021-07-22 ENCOUNTER — Other Ambulatory Visit: Payer: Self-pay | Admitting: Orthopedic Surgery

## 2021-07-22 DIAGNOSIS — K5901 Slow transit constipation: Secondary | ICD-10-CM

## 2021-07-22 MED ORDER — MAGNESIUM 400 MG PO CAPS: 1.0000 | ORAL_CAPSULE | Freq: Every day | ORAL | 3 refills | Status: DC

## 2021-07-22 NOTE — Telephone Encounter (Signed)
Left message for pt to call back  °

## 2021-07-23 ENCOUNTER — Encounter: Payer: Medicare Other | Admitting: Internal Medicine

## 2021-07-24 NOTE — Telephone Encounter (Signed)
After Multiple unsuccessful attempts to reach pt by phone a letter was created and sent to pt via my chart and via mail notifying pt of upcoming office visit with Dr. Loletha Carrow on 08/28/2021 at 10:40: ?  ?

## 2021-07-30 ENCOUNTER — Non-Acute Institutional Stay: Payer: Medicare Other | Admitting: Internal Medicine

## 2021-07-30 VITALS — BP 118/88 | HR 96 | Temp 97.7°F | Ht 59.0 in | Wt 119.4 lb

## 2021-07-30 DIAGNOSIS — R1319 Other dysphagia: Secondary | ICD-10-CM | POA: Diagnosis not present

## 2021-07-30 DIAGNOSIS — E785 Hyperlipidemia, unspecified: Secondary | ICD-10-CM

## 2021-07-30 DIAGNOSIS — E1169 Type 2 diabetes mellitus with other specified complication: Secondary | ICD-10-CM

## 2021-07-30 DIAGNOSIS — I10 Essential (primary) hypertension: Secondary | ICD-10-CM | POA: Diagnosis not present

## 2021-07-30 DIAGNOSIS — M5412 Radiculopathy, cervical region: Secondary | ICD-10-CM

## 2021-07-30 DIAGNOSIS — E119 Type 2 diabetes mellitus without complications: Secondary | ICD-10-CM

## 2021-07-30 DIAGNOSIS — F332 Major depressive disorder, recurrent severe without psychotic features: Secondary | ICD-10-CM

## 2021-07-30 DIAGNOSIS — F411 Generalized anxiety disorder: Secondary | ICD-10-CM | POA: Diagnosis not present

## 2021-07-30 DIAGNOSIS — M25551 Pain in right hip: Secondary | ICD-10-CM | POA: Diagnosis not present

## 2021-07-31 NOTE — Progress Notes (Signed)
? ?Location: [place] ?  ?Place of Service:  Clinic (12) ? ?Provider:  ? ?Code Status:  ?Goals of Care:  ? ?  05/21/2021  ? 10:53 AM  ?Advanced Directives  ?Does Patient Have a Medical Advance Directive? Yes  ?Type of Advance Directive Healthcare Power of Attorney  ?Does patient want to make changes to medical advance directive? No - Patient declined  ?Copy of [place] in Chart? Yes - validated most recent copy scanned in chart (See row information)  ? ? ? ?Chief Complaint  ?Patient presents with  ? Acute Visit  ?  Patient returns to the clinic to discuss medications  ? ? ?HPI: Patient is a 76 y.o. female seen today for an acute visit for Reviews her meds and her anxiety ? ?Patient has h/o Hypertension, Type 2 Diabetes, and Cervical Radiculopathy ? ?Depression and Anxiety ?Continues ot be issue ?Patient was crying again today ?Lost her husband suddenly one year ago ?It was Acute ?Since then she had been struggling with Anxiety and depression ?Is seeing Dr Casimiro Needle ?She lost almost 30 lbs at that time ?But is gaining it back now ?Dysphagia ?Underwent EGD ?Was found to have stenosis in GE junction needing Dilatation ?On Protonix now ?Eating well now ?Does not want to go through Colonoscopy ? ?Past Medical History:  ?Diagnosis Date  ? Allergic rhinitis   ? Allergy   ? Anemia   ? as child   ? Anxiety   ? Arthritis   ? Atrophic vaginitis 2017  ? Cancer [facility])   ? MOHS nose- skin cancer- basal cell   ? Cataract   ? removed both eyes   ? Depression   ? Diabetes mellitus without complication ([place])   ? GERD (gastroesophageal reflux disease)   ? Heart murmur   ? as child- outgrown   ? Hematuria 05/09/2015  ? had two nonobstructing renal calculi  ? Hepatitis A 1970s  ? Hyperlipidemia   ? Hypertension   ? Osteopenia   ? Osteoporosis   ? PONV (postoperative nausea and vomiting)   ? Shingles 06/04/2015  ? SUI (stress urinary incontinence, female) 2017  ? Thrombocytopenia ([place])   ? ? ?Past  Surgical History:  ?Procedure Laterality Date  ? BACK SURGERY  11/2018  ? CATARACT EXTRACTION W/ INTRAOCULAR LENS  IMPLANT, BILATERAL Bilateral   ? COLONOSCOPY    ? KNEE ARTHROSCOPY Left 2004, 1990s  ? [place]  ? UPPER GASTROINTESTINAL ENDOSCOPY    ? ? ?Allergies  ?Allergen Reactions  ? Keflex [Cephalexin]   ?  rash  ? ? ?Outpatient Encounter Medications as of 07/30/2021  ?Medication Sig  ? acetaminophen (TYLENOL) 500 MG tablet Take 500 mg by mouth every 6 (six) hours as needed.  ? ALPRAZolam (XANAX) 0.5 MG tablet Take 0.25 mg by mouth 2 (two) times daily.  ? atorvastatin (LIPITOR) 20 MG tablet Take 1 tablet (20 mg total) by mouth daily.  ? buPROPion (WELLBUTRIN XL) 150 MG 24 hr tablet Take 150 mg by mouth daily.  ? dapagliflozin propanediol (FARXIGA) 5 MG TABS tablet Take 1 tablet (5 mg total) by mouth daily before breakfast.  ? glucose blood (ONETOUCH VERIO) test strip Use to test blood sugar daily. Dx: E11.9  ? ketoconazole (NIZORAL) 2 % cream Apply 1 application topically 2 (two) times daily as needed (groin rash).  ? LORazepam (ATIVAN) 0.5 MG tablet Take 0.5 mg by mouth 3 (three) times daily.  ? losartan (COZAAR) 100 MG  tablet Take 100 mg by mouth daily.  ? MAGNESIUM PO Take 1,000 mg by mouth daily.  ? metFORMIN (GLUCOPHAGE) 1000 MG tablet TAKE 1 TABLET BY MOUTH  TWICE DAILY WITH MEALS  ? nortriptyline (PAMELOR) 25 MG capsule Take 50 mg by mouth at bedtime.  ? pantoprazole (PROTONIX) 40 MG tablet Take 40 mg by mouth daily.  ? [DISCONTINUED] esomeprazole (NEXIUM) 20 MG capsule Take 20 mg by mouth daily at 12 noon.  ? [DISCONTINUED] traMADol (ULTRAM) 50 MG tablet Take 50 mg by mouth every 6 (six) hours as needed.  ? [DISCONTINUED] ALPRAZolam (XANAX) 0.5 MG tablet TAKE 1/2 TABLET (0.'25MG'$ ) BY MOUTH EVERY MORNING, AT NOON AND AT BEDTIME (CONTROL)  ? [DISCONTINUED] Alum Hydroxide-Mag Carbonate (GAVISCON EXTRA STRENGTH) [phone] MG/10ML SUSP Take by mouth 2 (two) times daily as needed.  (Patient not taking: Reported on 07/10/2021)  ? [DISCONTINUED] Magnesium 400 MG CAPS Take 1 capsule by mouth daily.  ? [DISCONTINUED] Multiple Vitamins-Minerals (WOMENS 50+ MULTI VITAMIN/MIN PO) Take by mouth.  ? [DISCONTINUED] ondansetron (ZOFRAN) 4 MG tablet Take 1 tablet (4 mg total) by mouth every 6 (six) hours as needed for nausea or vomiting.  ? [DISCONTINUED] pantoprazole (PROTONIX) 40 MG tablet Take 1 tablet (40 mg total) by mouth daily. Take 30 minutes before breakfast.  ? [DISCONTINUED] vitamin k 100 MCG tablet Take 1 tablet (100 mcg total) by mouth daily.  ? ?No facility-administered encounter medications on file as of 07/30/2021.  ? ? ?Review of Systems:  ?Review of Systems  ?Constitutional:  Negative for activity change and appetite change.  ?HENT: Negative.    ?Respiratory:  Negative for cough and shortness of breath.   ?Cardiovascular:  Negative for leg swelling.  ?Gastrointestinal:  Negative for constipation.  ?Genitourinary: Negative.   ?Musculoskeletal:  Negative for arthralgias, gait problem and myalgias.  ?Skin: Negative.   ?Neurological:  Negative for dizziness and weakness.  ?Psychiatric/Behavioral:  Positive for dysphoric mood. Negative for confusion and sleep disturbance. The patient is nervous/anxious.   ? ?Health Maintenance  ?Topic Date Due  ? Pneumonia Vaccine 43+ Years old (20) 12/21/2014  ? COVID-19 Vaccine (5 - Booster for Moderna series) 02/19/2021  ? INFLUENZA VACCINE  11/04/2021  ? HEMOGLOBIN A1C  11/17/2021  ? OPHTHALMOLOGY EXAM  05/05/2022  ? FOOT EXAM  05/21/2022  ? TETANUS/TDAP  11/29/2029  ? DEXA SCAN  Completed  ? Hepatitis C Screening  Completed  ? Zoster Vaccines- Shingrix  Completed  ? HPV VACCINES  Aged Out  ? COLONOSCOPY (Pts 45-39yr Insurance coverage will need to be confirmed)  Discontinued  ? ? ?Physical Exam: ?Vitals:  ? 07/30/21 1015  ?BP: 118/88  ?Pulse: 96  ?Temp: 97.7 ?F (36.5 ?C)  ?SpO2: 96%  ?Weight: 119 lb 6.4 oz (54.2 kg)  ?Height: '4\' 11"'$  (1.499 m)  ? ?Body mass  index is 24.12 kg/m?.Marland Kitchen?Physical Exam ?Vitals reviewed.  ?Constitutional:   ?   Appearance: Normal appearance.  ?HENT:  ?   Head: Normocephalic.  ?   Nose: Nose normal.  ?   Mouth/Throat:  ?   Mouth: Mucous membranes are moist.  ?   Pharynx: Oropharynx is clear.  ?Eyes:  ?   Pupils: Pupils are equal, round, and reactive to light.  ?Cardiovascular:  ?   Rate and Rhythm: Normal rate and regular rhythm.  ?   Pulses: Normal pulses.  ?   Heart sounds: Normal heart sounds. No murmur heard. ?Pulmonary:  ?   Effort: Pulmonary effort is normal.  ?  Breath sounds: Normal breath sounds.  ?Abdominal:  ?   General: Abdomen is flat. Bowel sounds are normal.  ?   Palpations: Abdomen is soft.  ?Musculoskeletal:     ?   General: No swelling.  ?   Cervical back: Neck supple.  ?Skin: ?   General: Skin is warm.  ?Neurological:  ?   General: No focal deficit present.  ?   Mental Status: She is alert and oriented to person, place, and time.  ?Psychiatric:     ?   Mood and Affect: Mood normal.     ?   Thought Content: Thought content normal.  ? ? ?Labs reviewed: ?Basic Metabolic Panel: ?Recent Labs  ?  09/26/20 ?0000 01/21/21 ?0000 05/20/21 ?0000  ?NA 137 143 140  ?K 4.5 4.9 5.0  ?CL 101 107 105  ?CO2 21 24* 27*  ?BUN 19 22*  --   ?CREATININE 0.7 0.9 1.0  ?CALCIUM 9.1 9.5 9.3  ?TSH 1.67  --   --   ? ?Liver Function Tests: ?Recent Labs  ?  01/21/21 ?0000  ?AST 14  ?ALT 13  ?ALKPHOS 87  ?ALBUMIN 4.1  ? ?No results for input(s): LIPASE, AMYLASE in the last 8760 hours. ?No results for input(s): AMMONIA in the last 8760 hours. ?CBC: ?Recent Labs  ?  09/26/20 ?0000 01/21/21 ?0000  ?WBC 7.3 5.8  ?HGB 12.4 12.5  ?HCT 37 38  ?PLT 381 366  ? ?Lipid Panel: ?Recent Labs  ?  01/21/21 ?0000  ?CHOL 118  ?HDL 60  ?[place] 36  ?TRIG 109  ? ?Lab Results  ?Component Value Date  ? HGBA1C 6.8 05/20/2021  ? ? ?Procedures since last visit: ?No results found. ? ?Assessment/Plan ?1. Type 2 diabetes mellitus without complication, without long-term current use of  insulin ([place]) ?On Farxiga and Metformin ?A1C 6.8 ?[place] is costing her $ 500/Month ?Wants to know if she can try something else when she runs out of supply ?Will discuss Amaryl next visit ? ?2. GAD (gene

## 2021-08-01 ENCOUNTER — Encounter: Payer: Medicare Other | Admitting: Gastroenterology

## 2021-08-16 ENCOUNTER — Encounter: Payer: Self-pay | Admitting: Internal Medicine

## 2021-08-28 ENCOUNTER — Encounter: Payer: Self-pay | Admitting: Gastroenterology

## 2021-08-28 ENCOUNTER — Ambulatory Visit: Payer: Medicare Other | Admitting: Gastroenterology

## 2021-08-28 VITALS — BP 112/64 | HR 68 | Ht 59.0 in | Wt 119.0 lb

## 2021-08-28 DIAGNOSIS — Z800 Family history of malignant neoplasm of digestive organs: Secondary | ICD-10-CM

## 2021-08-28 DIAGNOSIS — K219 Gastro-esophageal reflux disease without esophagitis: Secondary | ICD-10-CM

## 2021-08-28 DIAGNOSIS — K449 Diaphragmatic hernia without obstruction or gangrene: Secondary | ICD-10-CM

## 2021-08-28 DIAGNOSIS — K222 Esophageal obstruction: Secondary | ICD-10-CM | POA: Diagnosis not present

## 2021-08-28 DIAGNOSIS — R1319 Other dysphagia: Secondary | ICD-10-CM | POA: Diagnosis not present

## 2021-08-28 DIAGNOSIS — E785 Hyperlipidemia, unspecified: Secondary | ICD-10-CM | POA: Diagnosis not present

## 2021-08-28 DIAGNOSIS — E1169 Type 2 diabetes mellitus with other specified complication: Secondary | ICD-10-CM | POA: Diagnosis not present

## 2021-08-28 DIAGNOSIS — E119 Type 2 diabetes mellitus without complications: Secondary | ICD-10-CM | POA: Diagnosis not present

## 2021-08-28 LAB — BASIC METABOLIC PANEL
BUN: 17 (ref 4–21)
CO2: 23 — AB (ref 13–22)
Chloride: 108 (ref 99–108)
Creatinine: 0.8 (ref 0.5–1.1)
Glucose: 124
Potassium: 4.8 mEq/L (ref 3.5–5.1)
Sodium: 143 (ref 137–147)

## 2021-08-28 LAB — HEPATIC FUNCTION PANEL
ALT: 15 U/L (ref 7–35)
AST: 15 (ref 13–35)
Alkaline Phosphatase: 83 (ref 25–125)
Bilirubin, Total: 0.2

## 2021-08-28 LAB — CBC AND DIFFERENTIAL
HCT: 38 (ref 36–46)
Hemoglobin: 12.6 (ref 12.0–16.0)
Platelets: 409 10*3/uL — AB (ref 150–400)
WBC: 6.2

## 2021-08-28 LAB — LIPID PANEL
Cholesterol: 139 (ref 0–200)
HDL: 62 (ref 35–70)
LDL Cholesterol: 52
LDl/HDL Ratio: 2.2
Triglycerides: 126 (ref 40–160)

## 2021-08-28 LAB — COMPREHENSIVE METABOLIC PANEL
Albumin: 4.4 (ref 3.5–5.0)
Calcium: 9.2 (ref 8.7–10.7)

## 2021-08-28 LAB — HEMOGLOBIN A1C: Hemoglobin A1C: 6.9

## 2021-08-28 LAB — CBC: RBC: 4.08 (ref 3.87–5.11)

## 2021-08-28 NOTE — Progress Notes (Signed)
[facility] Progress Note  Chief Complaint: Esophageal dysphagia and family history colon cancer  Subjective  History: Kimberly [name] was seen in the office for consultation March 3 with dysphagia and a family history of colon cancer in her brother. She had a negative Cologuard in 2021, but was advised to have both EGD for the dysphagia and colonoscopy as a better screening test in a patient with family history.  Schedule adjustments were made to accommodate both procedures, and but she decided to cancel the colonoscopy.  Dr. Carlean Purl perform the upper endoscopy and report is on file.  5 cm hiatal hernia, fundic gland polyps, and distal esophageal stricture.  20 mm balloon caused minimal mucosal disruption of the stricture, so biopsy forcep ring disruption performed.  Kimberly [name] reports that her reflux symptoms are improved on Nexium 20 mg daily, and her swallowing is much improved after the recent endoscopic intervention. She denies abdominal pain, altered bowel habits or rectal bleeding. The last year has been difficult for her since losing her beloved husband Kimberly [name] 2 complications of Parkinson's. ROS: Cardiovascular:  no chest pain Respiratory: no dyspnea  The patient's Past Medical, Family and Social History were reviewed and are on file in the EMR.  Objective:  Med list reviewed  Current Outpatient Medications:    ALPRAZolam (XANAX) 0.5 MG tablet, Take 0.25 mg by mouth 2 (two) times daily., Disp: , Rfl:    atorvastatin (LIPITOR) 20 MG tablet, Take 1 tablet (20 mg total) by mouth daily., Disp: 90 tablet, Rfl: 3   buPROPion (WELLBUTRIN XL) 150 MG 24 hr tablet, Take 150 mg by mouth daily., Disp: , Rfl:    esomeprazole (NEXIUM) 20 MG capsule, Take 20 mg by mouth daily at 12 noon., Disp: , Rfl:    glucose blood (ONETOUCH VERIO) test strip, Use to test blood sugar daily. Dx: E11.9, Disp: 100 each, Rfl: 3   ketoconazole (NIZORAL) 2 % cream, Apply 1 application topically 2 (two) times daily as  needed (groin rash)., Disp: 30 g, Rfl: 1   LORazepam (ATIVAN) 0.5 MG tablet, Take 0.5 mg by mouth 3 (three) times daily., Disp: , Rfl:    metFORMIN (GLUCOPHAGE) 1000 MG tablet, TAKE 1 TABLET BY MOUTH  TWICE DAILY WITH MEALS, Disp: 180 tablet, Rfl: 3   nortriptyline (PAMELOR) 25 MG capsule, Take 50 mg by mouth at bedtime., Disp: , Rfl:    Vital signs in last 24 hrs: Vitals:   08/28/21 1022  BP: 112/64  Pulse: 68   Wt Readings from Last 3 Encounters:  08/28/21 119 lb (54 kg)  07/30/21 119 lb 6.4 oz (54.2 kg)  07/10/21 112 lb (50.8 kg)    Physical Exam  Well-appearing.  No additional exam, entire visit spent in result review and discussion  Labs:   ___________________________________________ Radiologic studies:   ____________________________________________ Other:   _____________________________________________ Assessment & Plan  Assessment: Encounter Diagnoses  Name Primary?   Esophageal dysphagia Yes   Gastroesophageal reflux disease without esophagitis    Hiatal hernia    Esophageal stricture    Family history of colon cancer     Much improved on Nexium and recent endoscopic intervention. She will stay on Nexium 20 mg daily long-term and engage in diet and lifestyle antireflux measures. Kimberly [name] is also considering having a screening colonoscopy done, so we discussed that some and she will contact me when if she feels ready to do so   20 minutes were spent on this encounter (including chart review, history/exam, counseling/coordination of  care, and documentation) > 50% of that time was spent on counseling and coordination of care.   Kimberly [name]

## 2021-08-28 NOTE — Patient Instructions (Signed)
If you are age 78 or older, your body mass index should be between 23-30. Your Body mass index is 24.04 kg/m. If this is out of the aforementioned range listed, please consider follow up with your Primary Care Provider.  If you are age 88 or younger, your body mass index should be between 19-25. Your Body mass index is 24.04 kg/m. If this is out of the aformentioned range listed, please consider follow up with your Primary Care Provider.   ________________________________________________________  The [facility] providers would like to encourage you to use [facility] to communicate with providers for non-urgent requests or questions.  Due to long hold times on the telephone, sending your provider a message by [facility] may be a faster and more efficient way to get a response.  Please allow 48 business hours for a response.  Please remember that this is for non-urgent requests.  _______________________________________________________  It was a pleasure to see you today!  Thank you for trusting me with your gastrointestinal care!

## 2021-08-29 ENCOUNTER — Other Ambulatory Visit: Payer: Self-pay | Admitting: Adult Health

## 2021-08-29 MED ORDER — DAPAGLIFLOZIN PROPANEDIOL 5 MG PO TABS: 5.0000 mg | ORAL_TABLET | Freq: Every day | ORAL | 0 refills | Status: DC

## 2021-08-29 MED ORDER — LOSARTAN POTASSIUM 100 MG PO TABS: 100.0000 mg | ORAL_TABLET | Freq: Every day | ORAL | 11 refills | Status: DC

## 2021-09-03 ENCOUNTER — Other Ambulatory Visit: Payer: Self-pay | Admitting: Internal Medicine

## 2021-09-03 ENCOUNTER — Other Ambulatory Visit (HOSPITAL_COMMUNITY): Payer: Self-pay

## 2021-09-03 MED ORDER — ALPRAZOLAM 0.5 MG PO TABS
0.2500 mg | ORAL_TABLET | Freq: Two times a day (BID) | ORAL | 1 refills | Status: DC
  Filled 2021-09-03: qty 30, 30d supply, fill #0

## 2021-09-03 MED ORDER — LORAZEPAM 0.5 MG PO TABS
0.5000 mg | ORAL_TABLET | Freq: Three times a day (TID) | ORAL | 1 refills | Status: DC
  Filled 2021-09-03: qty 90, 30d supply, fill #0

## 2021-09-03 NOTE — Telephone Encounter (Signed)
Patient has request refill on medications Alprazolam, and Lorazepam. Patient had medications added to med list this year but medications werent filled. Patient has NO ( Non Opioid Contract ) on file. Patient has upcoming appointment 09/08/2021 with Royal Hawthorn, NP. Sign Contract added to patient appointment notes. Medication pend and sent to provider Melvyn Novas, NP for approval.

## 2021-09-05 ENCOUNTER — Encounter: Payer: Self-pay | Admitting: Adult Health

## 2021-09-08 ENCOUNTER — Non-Acute Institutional Stay: Payer: Medicare Other | Admitting: Adult Health

## 2021-09-08 VITALS — BP 122/86 | HR 108 | Temp 98.8°F | Ht 59.0 in | Wt 120.6 lb

## 2021-09-08 DIAGNOSIS — F411 Generalized anxiety disorder: Secondary | ICD-10-CM

## 2021-09-08 DIAGNOSIS — F322 Major depressive disorder, single episode, severe without psychotic features: Secondary | ICD-10-CM | POA: Diagnosis not present

## 2021-09-08 DIAGNOSIS — I10 Essential (primary) hypertension: Secondary | ICD-10-CM | POA: Diagnosis not present

## 2021-09-08 DIAGNOSIS — K219 Gastro-esophageal reflux disease without esophagitis: Secondary | ICD-10-CM | POA: Diagnosis not present

## 2021-09-08 DIAGNOSIS — E1169 Type 2 diabetes mellitus with other specified complication: Secondary | ICD-10-CM | POA: Diagnosis not present

## 2021-09-08 DIAGNOSIS — E1142 Type 2 diabetes mellitus with diabetic polyneuropathy: Secondary | ICD-10-CM

## 2021-09-08 DIAGNOSIS — E785 Hyperlipidemia, unspecified: Secondary | ICD-10-CM | POA: Diagnosis not present

## 2021-09-08 DIAGNOSIS — K449 Diaphragmatic hernia without obstruction or gangrene: Secondary | ICD-10-CM | POA: Diagnosis not present

## 2021-09-08 MED ORDER — EMPAGLIFLOZIN 10 MG PO TABS: 10.0000 mg | ORAL_TABLET | Freq: Every day | ORAL | 3 refills | Status: DC

## 2021-09-08 NOTE — Progress Notes (Signed)
Location:  [facility]  POS: clinic  Provider:  Cindi Carbon, [place] [phone]   Code Status:  Goals of Care:     09/05/2021    4:34 PM  Advanced Directives  Does Patient Have a Medical Advance Directive? No  Would patient like information on creating a medical advance directive? No - Patient declined     Chief Complaint  Patient presents with   Medical Management of Chronic Issues    Patient returns to the clinic for 3 month follow up.    Quality Metric Gaps    Verified [facility] and [facility] patient is due for PCV    HPI: Patient is a 76 y.o. female seen today for medical management of chronic diseases.    A1c 6.9, on farxiga and metformin  Lost 30 lbs unintentionally since her husband passed away. She is trying to eat more and gain weight  She reports the farxiga is costing $400 per month  Has some tingling in her toes been there for two years no pain. This started after back surgery Denies any foot pain.  Ambulating well.   Continues with depression and anxiety and followed by Dr. Casimiro Needle. Was taking ativan bid but now is tid and she says this has really helped.   GERD/dysphagia EGD April of 2023 showed a hiatal hernia and moderate stenosis which was dilated Past Medical History:  Diagnosis Date   Allergic rhinitis    Allergy    Anemia    as child    Anxiety    Arthritis    Atrophic vaginitis 2017   Cancer ([place])    MOHS nose- skin cancer- basal cell    Cataract    removed both eyes    Depression    Diabetes mellitus without complication ([place])    GERD (gastroesophageal reflux disease)    Heart murmur    as child- outgrown    Hematuria 05/09/2015   had two nonobstructing renal calculi   Hepatitis A 1970s   Hyperlipidemia    Hypertension    Osteopenia    Osteoporosis    PONV (postoperative nausea and vomiting)    Shingles 06/04/2015   SUI (stress urinary incontinence, female) 2017   Thrombocytopenia ([place])     Past Surgical  History:  Procedure Laterality Date   BACK SURGERY  11/2018   CATARACT EXTRACTION W/ INTRAOCULAR LENS  IMPLANT, BILATERAL Bilateral    COLONOSCOPY     KNEE ARTHROSCOPY Left 2004, 1990s   TONSILLECTOMY AND ADENOIDECTOMY  1952   UPPER GASTROINTESTINAL ENDOSCOPY      Allergies  Allergen Reactions   Keflex [Cephalexin]     rash    Outpatient Encounter Medications as of 09/08/2021  Medication Sig   ALPRAZolam (XANAX) 0.5 MG tablet Take 1/2 tablet (0.25 mg total) by mouth 2 (two) times daily.   atorvastatin (LIPITOR) 20 MG tablet Take 1 tablet (20 mg total) by mouth daily.   buPROPion (WELLBUTRIN XL) 150 MG 24 hr tablet Take 150 mg by mouth daily.   dapagliflozin propanediol (FARXIGA) 5 MG TABS tablet Take 1 tablet (5 mg total) by mouth daily.   esomeprazole (NEXIUM) 20 MG capsule Take 20 mg by mouth daily at 12 noon.   glucose blood (ONETOUCH VERIO) test strip Use to test blood sugar daily. Dx: E11.9   ketoconazole (NIZORAL) 2 % cream Apply 1 application topically 2 (two) times daily as needed (groin rash).   LORazepam (ATIVAN) 0.5 MG tablet Take 1 tablet (0.5 mg  total) by mouth 3 (three) times daily.   losartan (COZAAR) 100 MG tablet Take 1 tablet (100 mg total) by mouth daily.   metFORMIN (GLUCOPHAGE) 1000 MG tablet TAKE 1 TABLET BY MOUTH  TWICE DAILY WITH MEALS   nortriptyline (PAMELOR) 25 MG capsule Take 50 mg by mouth at bedtime.   No facility-administered encounter medications on file as of 09/08/2021.    Review of Systems:  Review of Systems  Constitutional:  Positive for unexpected weight change. Negative for activity change, appetite change, chills, diaphoresis, fatigue and fever.  HENT:  Negative for congestion.   Respiratory:  Negative for cough, shortness of breath and wheezing.   Cardiovascular:  Negative for chest pain, palpitations and leg swelling.  Gastrointestinal:  Negative for abdominal distention, abdominal pain, constipation and diarrhea.  Genitourinary:  Negative  for difficulty urinating and dysuria.  Musculoskeletal:  Negative for arthralgias, back pain, gait problem, joint swelling and myalgias.  Neurological:  Negative for dizziness, tremors, seizures, syncope, facial asymmetry, speech difficulty, weakness, light-headedness, numbness and headaches.       Tingling in feet  Psychiatric/Behavioral:  Positive for decreased concentration and dysphoric mood. Negative for agitation, behavioral problems and confusion. The patient is nervous/anxious.    Health Maintenance  Topic Date Due   Pneumonia Vaccine 65+ Years old (3) 12/21/2014   COVID-19 Vaccine (6 - Booster for Moderna series) 09/24/2021   INFLUENZA VACCINE  11/04/2021   HEMOGLOBIN A1C  02/28/2022   OPHTHALMOLOGY EXAM  05/05/2022   FOOT EXAM  05/21/2022   TETANUS/TDAP  11/29/2029   DEXA SCAN  Completed   Hepatitis C Screening  Completed   Zoster Vaccines- Shingrix  Completed   HPV VACCINES  Aged Out   COLONOSCOPY (Pts 45-42yr Insurance coverage will need to be confirmed)  Discontinued    Physical Exam: Vitals:   09/08/21 1342  BP: 122/86  Pulse: (!) 108  Temp: 98.8 F (37.1 C)  SpO2: 95%  Weight: 120 lb 9.6 oz (54.7 kg)  Height: '4\' 11"'$  (1.499 m)   Body mass index is 24.36 kg/m. Physical Exam Vitals and nursing note reviewed.  Constitutional:      General: She is not in acute distress.    Appearance: She is not diaphoretic.  HENT:     Head: Normocephalic and atraumatic.  Neck:     Vascular: No JVD.  Cardiovascular:     Rate and Rhythm: Normal rate and regular rhythm.     Pulses:          Dorsalis pedis pulses are 2+ on the right side and 2+ on the left side.     Heart sounds: No murmur heard. Pulmonary:     Effort: Pulmonary effort is normal. No respiratory distress.     Breath sounds: Normal breath sounds. No wheezing.  Musculoskeletal:     Right lower leg: No edema.     Left lower leg: No edema.     Right foot: Normal range of motion. No deformity.     Left foot:  Normal range of motion. No deformity.  Feet:     Right foot:     Protective Sensation: 4 sites tested.  4 sites sensed.     Skin integrity: Skin integrity normal.     Left foot:     Protective Sensation: 4 sites tested.  4 sites sensed.     Skin integrity: Skin integrity normal.  Skin:    General: Skin is warm and dry.  Neurological:     Mental  Status: She is alert and oriented to person, place, and time.    Labs reviewed: Basic Metabolic Panel: Recent Labs    09/26/20 0000 01/21/21 0000 05/20/21 0000 08/28/21 0000  NA 137 143 140 143  K 4.5 4.9 5.0 4.8  CL 101 107 105 108  CO2 21 24* 27* 23*  BUN 19 22*  --  17  CREATININE 0.7 0.9 1.0 0.8  CALCIUM 9.1 9.5 9.3 9.2  TSH 1.67  --   --   --    Liver Function Tests: Recent Labs    01/21/21 0000 08/28/21 0000  AST 14 15  ALT 13 15  ALKPHOS 87 83  ALBUMIN 4.1 4.4   No results for input(s): LIPASE, AMYLASE in the last 8760 hours. No results for input(s): AMMONIA in the last 8760 hours. CBC: Recent Labs    09/26/20 0000 01/21/21 0000 08/28/21 0000  WBC 7.3 5.8 6.2  HGB 12.4 12.5 12.6  HCT 37 38 38  PLT 381 366 409*   Lipid Panel: Recent Labs    01/21/21 0000 08/28/21 0000  CHOL 118 139  HDL 60 62  LDLCALC 36 52  TRIG 109 126   Lab Results  Component Value Date   HGBA1C 6.9 08/28/2021    Procedures since last visit: No results found.  Assessment/Plan  1. Type 2 diabetes mellitus with diabetic polyneuropathy, without long-term current use of insulin (HCC) D/C farxiga due to cost and try jardiance Continue metformin A1C in 3 months  Foot exam done - empagliflozin (JARDIANCE) 10 MG TABS tablet; Take 1 tablet (10 mg total) by mouth daily before breakfast.  Dispense: 30 tablet; Refill: 3  2. Hyperlipidemia associated with type 2 diabetes mellitus [facility]) Lab Results  Component Value Date   LDLCALC 52 08/28/2021  Continue lipitor   3. GAD (generalized anxiety disorder) Improved per pt Continue  ativan and xanax recommended by Dr. Casimiro Needle Signed controlled substance contrat  4. Depression, major, single episode, severe ([place]) On Pamelor and wellbutrin  Improving over time but continues with periods of anxiety   5. Essential hypertension Controlled Continue current meds.   6. Gastroesophageal reflux disease without esophagitis Continue nexium   7. Hiatal hernia On EGD 07/2021   Labs/tests ordered:  * No order type specified * BMP A1C prior to apt Next appt:  3 months with Dr Lyndel Safe      Total time 46mn:  time greater than 50% of total time spent doing pt counseling and coordination of care

## 2021-09-17 ENCOUNTER — Other Ambulatory Visit: Payer: Self-pay | Admitting: Internal Medicine

## 2021-09-29 ENCOUNTER — Other Ambulatory Visit: Payer: Self-pay | Admitting: Internal Medicine

## 2021-09-29 MED ORDER — LORAZEPAM 0.5 MG PO TABS
0.5000 mg | ORAL_TABLET | Freq: Three times a day (TID) | ORAL | 1 refills | Status: DC
Start: 2021-09-29 — End: 2022-01-27

## 2021-10-06 DIAGNOSIS — M7061 Trochanteric bursitis, right hip: Secondary | ICD-10-CM | POA: Diagnosis not present

## 2021-10-06 DIAGNOSIS — M5416 Radiculopathy, lumbar region: Secondary | ICD-10-CM | POA: Diagnosis not present

## 2021-10-31 ENCOUNTER — Encounter: Payer: Self-pay | Admitting: Internal Medicine

## 2021-11-03 ENCOUNTER — Telehealth: Payer: Self-pay | Admitting: Internal Medicine

## 2021-11-03 NOTE — Telephone Encounter (Unsigned)
Left message for pt to call back  °

## 2021-11-04 NOTE — Telephone Encounter (Unsigned)
Left message for pt to call back  °

## 2021-11-05 NOTE — Telephone Encounter (Signed)
Left message for pt to call back: My Chart Message sent to pt

## 2021-11-06 NOTE — Telephone Encounter (Signed)
Pt called again. Left message for pt to call back

## 2021-11-06 NOTE — Telephone Encounter (Signed)
Tried to contact pt. Pt left a voice mail stating that I will call her at 11:00 AM to discuss her symptoms

## 2021-11-07 ENCOUNTER — Telehealth: Payer: Self-pay | Admitting: Internal Medicine

## 2021-11-07 NOTE — Telephone Encounter (Signed)
Left message for pt to call back  °

## 2021-11-07 NOTE — Telephone Encounter (Signed)
Patient has called again.  She took another pill today and it is still making her vomit.  She needs to know what to do.  Please call patient as soon as you can and advise.  Thank you.

## 2021-11-07 NOTE — Telephone Encounter (Signed)
PT is calling in about ondansetron (ZOFRAN) 4 MG. It is making her vomit. Was supposed to take after procedure in April 2023 and wants to know if she should take the rest. Please reach out to advise. Thank you.

## 2021-11-07 NOTE — Telephone Encounter (Signed)
Patient returned your call, stated that she has been vomiting and does not know what is wrong and is requesting a call back. Please advise.

## 2021-11-10 ENCOUNTER — Non-Acute Institutional Stay: Payer: Medicare Other | Admitting: Adult Health

## 2021-11-10 ENCOUNTER — Encounter: Payer: Self-pay | Admitting: Adult Health

## 2021-11-10 VITALS — BP 142/96 | HR 110 | Temp 98.4°F | Ht 59.0 in | Wt 119.6 lb

## 2021-11-10 DIAGNOSIS — R1313 Dysphagia, pharyngeal phase: Secondary | ICD-10-CM

## 2021-11-10 DIAGNOSIS — K219 Gastro-esophageal reflux disease without esophagitis: Secondary | ICD-10-CM | POA: Diagnosis not present

## 2021-11-10 DIAGNOSIS — E1169 Type 2 diabetes mellitus with other specified complication: Secondary | ICD-10-CM

## 2021-11-10 MED ORDER — FAMOTIDINE 20 MG PO TABS: 20.0000 mg | ORAL_TABLET | Freq: Every day | ORAL | 0 refills | Status: DC

## 2021-11-10 MED ORDER — ESOMEPRAZOLE MAGNESIUM 40 MG PO CPDR: 40.0000 mg | DELAYED_RELEASE_CAPSULE | Freq: Every day | ORAL | 3 refills | Status: DC

## 2021-11-10 NOTE — Telephone Encounter (Signed)
Left message for pt to call back  °

## 2021-11-10 NOTE — Patient Instructions (Signed)
Avoid caffeine and alcohol  Avoid acidic foods  Small frequent meals 6 x a day  Be sure you take nexium 30 min prior to a meal on an empty stomach  Follow up with GI if not improving.

## 2021-11-10 NOTE — Progress Notes (Signed)
Location:  [facility]  POS: Clinic  Provider: Royal Hawthorn, ANP  Code Status:  Goals of Care:     09/05/2021    4:34 PM  Advanced Directives  Does Patient Have a Medical Advance Directive? No  Would patient like information on creating a medical advance directive? No - Patient declined     Chief Complaint  Patient presents with   Acute Visit    Patient returns to the clinic after having endoscopy done but food is still getting stuck. She has been taking nexium but feels she need something stronger.     HPI: Patient is a 76 y.o. female seen today for vomiting.   Eats and then food gets stuck and she needs to throw up. Has been going on for a few weeks.  She had an EGD on 07/10/21 which showed some stenosis and a dilator was passed, also showed hiatal hernia. She is taking nexium but not on an empty stomach. She has been trying to get in touch with GI but they have been playing phone tag. I suggested that she needed a f/u visit and she started crying stating that she did not want to go back. She has a hard time doing small meals due to the way [facility] distributes meals in the dining area. She had a large weight loss after her husband died. Weight has been stable the past three months. There is no pain with swallowing. No blood in the vomit. No fever or abd pain.   Wt Readings from Last 3 Encounters:  11/10/21 119 lb 9.6 oz (54.3 kg)  09/08/21 120 lb 9.6 oz (54.7 kg)  08/28/21 119 lb (54 kg)    Past Medical History:  Diagnosis Date   Allergic rhinitis    Allergy    Anemia    as child    Anxiety    Arthritis    Atrophic vaginitis 2017   Cancer ([place])    MOHS nose- skin cancer- basal cell    Cataract    removed both eyes    Depression    Diabetes mellitus without complication ([place])    GERD (gastroesophageal reflux disease)    Heart murmur    as child- outgrown    Hematuria 05/09/2015   had two nonobstructing renal calculi   Hepatitis A 1970s   Hyperlipidemia     Hypertension    Osteopenia    Osteoporosis    PONV (postoperative nausea and vomiting)    Shingles 06/04/2015   SUI (stress urinary incontinence, female) 2017   Thrombocytopenia ([place])     Past Surgical History:  Procedure Laterality Date   BACK SURGERY  11/2018   CATARACT EXTRACTION W/ INTRAOCULAR LENS  IMPLANT, BILATERAL Bilateral    COLONOSCOPY     KNEE ARTHROSCOPY Left 2004, 1990s   TONSILLECTOMY AND ADENOIDECTOMY  1952   UPPER GASTROINTESTINAL ENDOSCOPY      Allergies  Allergen Reactions   Keflex [Cephalexin]     rash    Outpatient Encounter Medications as of 11/10/2021  Medication Sig   ALPRAZolam (XANAX) 0.5 MG tablet Take 1/2 tablet (0.25 mg total) by mouth 2 (two) times daily.   atorvastatin (LIPITOR) 20 MG tablet Take 1 tablet (20 mg total) by mouth daily.   buPROPion (WELLBUTRIN XL) 150 MG 24 hr tablet Take 150 mg by mouth daily.   empagliflozin (JARDIANCE) 10 MG TABS tablet Take 1 tablet (10 mg total) by mouth daily before breakfast.   esomeprazole (NEXIUM) 20 MG capsule Take 20  mg by mouth daily at 12 noon.   glucose blood (ONETOUCH VERIO) test strip Use to test blood sugar daily. Dx: E11.9   ketoconazole (NIZORAL) 2 % cream Apply 1 application topically 2 (two) times daily as needed (groin rash).   LORazepam (ATIVAN) 0.5 MG tablet Take 1 tablet (0.5 mg total) by mouth 3 (three) times daily.   losartan (COZAAR) 100 MG tablet TAKE 1 TABLET BY MOUTH ONCE DAILY   metFORMIN (GLUCOPHAGE) 1000 MG tablet TAKE 1 TABLET BY MOUTH  TWICE DAILY WITH MEALS   nortriptyline (PAMELOR) 25 MG capsule Take 50 mg by mouth at bedtime.   No facility-administered encounter medications on file as of 11/10/2021.    Review of Systems:  Review of Systems  Constitutional:  Positive for appetite change. Negative for activity change, chills, diaphoresis, fatigue, fever and unexpected weight change.  HENT:  Negative for congestion.   Respiratory:  Negative for cough, shortness of breath and  wheezing.   Cardiovascular:  Negative for chest pain, palpitations and leg swelling.  Gastrointestinal:  Positive for nausea and vomiting. Negative for abdominal distention, abdominal pain, constipation and diarrhea.  Genitourinary:  Negative for difficulty urinating and dysuria.  Musculoskeletal:  Negative for arthralgias, back pain, gait problem, joint swelling and myalgias.  Neurological:  Negative for dizziness, tremors, seizures, syncope, facial asymmetry, speech difficulty, weakness, light-headedness, numbness and headaches.  Psychiatric/Behavioral:  Positive for dysphoric mood. Negative for agitation, behavioral problems and confusion. The patient is nervous/anxious.     Health Maintenance  Topic Date Due   COVID-19 Vaccine (6 - Booster for Moderna series) 09/24/2021   INFLUENZA VACCINE  11/04/2021   Pneumonia Vaccine 22+ Years old (3 - PPSV23 or PCV20) 09/07/2022 (Originally 12/21/2014)   HEMOGLOBIN A1C  02/28/2022   OPHTHALMOLOGY EXAM  05/05/2022   FOOT EXAM  05/21/2022   TETANUS/TDAP  11/29/2029   DEXA SCAN  Completed   Hepatitis C Screening  Completed   Zoster Vaccines- Shingrix  Completed   HPV VACCINES  Aged Out   COLONOSCOPY (Pts 45-89yr Insurance coverage will need to be confirmed)  Discontinued    Physical Exam: Vitals:   11/10/21 1521  BP: (!) 142/96  Pulse: (!) 110  Temp: 98.4 F (36.9 C)  SpO2: 95%  Weight: 119 lb 9.6 oz (54.3 kg)  Height: '4\' 11"'$  (1.499 m)   Body mass index is 24.16 kg/m. Physical Exam Vitals and nursing note reviewed.  Constitutional:      General: She is not in acute distress.    Appearance: She is not diaphoretic.  HENT:     Head: Normocephalic and atraumatic.  Neck:     Vascular: No JVD.  Cardiovascular:     Rate and Rhythm: Normal rate and regular rhythm.     Heart sounds: No murmur heard. Pulmonary:     Effort: Pulmonary effort is normal. No respiratory distress.     Breath sounds: Normal breath sounds. No wheezing.   Abdominal:     General: Bowel sounds are normal. There is no distension.     Palpations: Abdomen is soft.     Tenderness: There is no abdominal tenderness. There is no right CVA tenderness or left CVA tenderness.  Skin:    General: Skin is warm and dry.  Neurological:     Mental Status: She is alert and oriented to person, place, and time.  Psychiatric:     Comments: anxious     Labs reviewed: Basic Metabolic Panel: Recent Labs    01/21/21 0000  05/20/21 0000 08/28/21 0000  NA 143 140 143  K 4.9 5.0 4.8  CL 107 105 108  CO2 24* 27* 23*  BUN 22*  --  17  CREATININE 0.9 1.0 0.8  CALCIUM 9.5 9.3 9.2   Liver Function Tests: Recent Labs    01/21/21 0000 08/28/21 0000  AST 14 15  ALT 13 15  ALKPHOS 87 83  ALBUMIN 4.1 4.4   No results for input(s): "LIPASE", "AMYLASE" in the last 8760 hours. No results for input(s): "AMMONIA" in the last 8760 hours. CBC: Recent Labs    01/21/21 0000 08/28/21 0000  WBC 5.8 6.2  HGB 12.5 12.6  HCT 38 38  PLT 366 409*   Lipid Panel: Recent Labs    01/21/21 0000 08/28/21 0000  CHOL 118 139  HDL 60 62  LDLCALC 36 52  TRIG 109 126   Lab Results  Component Value Date   HGBA1C 6.9 08/28/2021    Procedures since last visit: No results found.  Assessment/Plan  1. Gastroesophageal reflux disease without esophagitis  - esomeprazole (NEXIUM) 40 MG capsule; Take 1 capsule (40 mg total) by mouth daily.  Dispense: 30 capsule; Refill: 3 x 4 weeks then return to 20 mg  Be sure to take the PPI prior to meals by 30 min   - famotidine (PEPCID) 20 MG tablet; Take 1 tablet (20 mg total) by mouth at bedtime. X 4 weeks then stop  Dispense: 30 tablet; Refill: 0 Avoid caffeine and alcohol  Avoid acidic foods  Small frequent meals 6 x a day  Be sure you take nexium 30 min prior to a meal on an empty stomach  Follow up with GI if not improving. ? If she has gastroparesis and needs to try reglan   2. Pharyngeal dysphagia EGD in  April  Recommend f/u if not improving with GI   3. DM II Reduce metformin to 500 mg bid to see if this helps with vomiting, although her symptoms seem to be more of a pattern of dysphagia, also has lots of anxiety      Labs/tests ordered:  * No order type specified * Next appt:  12/17/2021   Total time 40mn:  time greater than 50% of total time spent doing pt counseling and coordination of care

## 2021-11-11 NOTE — Telephone Encounter (Signed)
Left message for pt to call back  °

## 2021-11-13 NOTE — Telephone Encounter (Unsigned)
Left message for pt to call back  °

## 2021-11-14 NOTE — Telephone Encounter (Unsigned)
Left message for pt to call back My chart message sent to pt

## 2021-11-14 NOTE — Telephone Encounter (Signed)
Left Message for pt to call back:  My Chart message sent to pt

## 2021-12-11 DIAGNOSIS — E1069 Type 1 diabetes mellitus with other specified complication: Secondary | ICD-10-CM | POA: Diagnosis not present

## 2021-12-11 LAB — BASIC METABOLIC PANEL
BUN: 20 (ref 4–21)
CO2: 21 (ref 13–22)
Chloride: 108 (ref 99–108)
Creatinine: 0.9 (ref 0.5–1.1)
Glucose: 144
Potassium: 4.7 mEq/L (ref 3.5–5.1)
Sodium: 142 (ref 137–147)

## 2021-12-11 LAB — COMPREHENSIVE METABOLIC PANEL
Calcium: 9.2 (ref 8.7–10.7)
eGFR: 64

## 2021-12-11 LAB — HEMOGLOBIN A1C: Hemoglobin A1C: 7.3

## 2021-12-12 ENCOUNTER — Encounter: Payer: Self-pay | Admitting: Internal Medicine

## 2021-12-16 ENCOUNTER — Other Ambulatory Visit (HOSPITAL_BASED_OUTPATIENT_CLINIC_OR_DEPARTMENT_OTHER): Payer: Self-pay

## 2021-12-16 MED ORDER — INFLUENZA VAC A&B SA ADJ QUAD 0.5 ML IM PRSY
PREFILLED_SYRINGE | INTRAMUSCULAR | 0 refills | Status: DC
  Filled 2021-12-16: qty 0.5, 1d supply, fill #0

## 2021-12-17 ENCOUNTER — Non-Acute Institutional Stay: Payer: Medicare Other | Admitting: Internal Medicine

## 2021-12-17 ENCOUNTER — Encounter: Payer: Medicare Other | Admitting: Internal Medicine

## 2021-12-17 ENCOUNTER — Encounter: Payer: Self-pay | Admitting: Internal Medicine

## 2021-12-17 VITALS — BP 118/80 | HR 83 | Temp 98.4°F | Ht 59.0 in | Wt 118.4 lb

## 2021-12-17 DIAGNOSIS — K219 Gastro-esophageal reflux disease without esophagitis: Secondary | ICD-10-CM | POA: Diagnosis not present

## 2021-12-17 DIAGNOSIS — F411 Generalized anxiety disorder: Secondary | ICD-10-CM

## 2021-12-17 DIAGNOSIS — E785 Hyperlipidemia, unspecified: Secondary | ICD-10-CM

## 2021-12-17 DIAGNOSIS — E1169 Type 2 diabetes mellitus with other specified complication: Secondary | ICD-10-CM

## 2021-12-17 DIAGNOSIS — F322 Major depressive disorder, single episode, severe without psychotic features: Secondary | ICD-10-CM

## 2021-12-17 DIAGNOSIS — R1319 Other dysphagia: Secondary | ICD-10-CM | POA: Diagnosis not present

## 2021-12-17 DIAGNOSIS — I10 Essential (primary) hypertension: Secondary | ICD-10-CM

## 2021-12-17 NOTE — Progress Notes (Signed)
Location:  [place] of Service:  Clinic (12)  Provider:   Code Status:  Goals of Care:     09/05/2021    4:34 PM  Advanced Directives  Does Patient Have a Medical Advance Directive? No  Would patient like information on creating a medical advance directive? No - Patient declined     Chief Complaint  Patient presents with   Medical Management of Chronic Issues    3 month follow-up and discuss recent labs. Discuss need for additional covid boosters or post pone if patient refuses.     HPI: Patient is a 76 y.o. female seen today for medical management of chronic diseases.    Patient has h/o Hypertension, Type 2 Diabetes, and Cervical Radiculopathy and Dysphagia  Depression and Anxiety Lost husband a year ago Since then struggling with depression Sees [facility] and in house Psychologist  Seems to be doing better  Dysphagia Much better since EGD and Dilatation Back on Nexium Symptoms of nausea resolved  Wt Readings from Last 3 Encounters:  12/17/21 118 lb 6.4 oz (53.7 kg)  11/10/21 119 lb 9.6 oz (54.3 kg)  09/08/21 120 lb 9.6 oz (54.7 kg)    Has 2 sons in town one out of state  Past Medical History:  Diagnosis Date   Allergic rhinitis    Allergy    Anemia    as child    Anxiety    Arthritis    Atrophic vaginitis 2017   Cancer ([place])    MOHS nose- skin cancer- basal cell    Cataract    removed both eyes    Depression    Diabetes mellitus without complication (HCC)    GERD (gastroesophageal reflux disease)    Heart murmur    as child- outgrown    Hematuria 05/09/2015   had two nonobstructing renal calculi   Hepatitis A 1970s   Hyperlipidemia    Hypertension    Osteopenia    Osteoporosis    PONV (postoperative nausea and vomiting)    Shingles 06/04/2015   SUI (stress urinary incontinence, female) 2017   Thrombocytopenia ([place])     Past Surgical History:  Procedure Laterality Date   BACK SURGERY  11/2018   CATARACT  EXTRACTION W/ INTRAOCULAR LENS  IMPLANT, BILATERAL Bilateral    COLONOSCOPY     KNEE ARTHROSCOPY Left 2004, 1990s   TONSILLECTOMY AND ADENOIDECTOMY  1952   UPPER GASTROINTESTINAL ENDOSCOPY      Allergies  Allergen Reactions   Keflex [Cephalexin]     rash    Outpatient Encounter Medications as of 12/17/2021  Medication Sig   acetaminophen (TYLENOL) 500 MG tablet Take 500 mg by mouth every 6 (six) hours as needed.   ALPRAZolam (XANAX) 0.5 MG tablet Take 1/2 tablet (0.25 mg total) by mouth 2 (two) times daily. (Patient taking differently: Take 0.5 mg by mouth 2 (two) times daily.)   atorvastatin (LIPITOR) 20 MG tablet Take 1 tablet (20 mg total) by mouth daily.   buPROPion (WELLBUTRIN XL) 150 MG 24 hr tablet Take 150 mg by mouth daily.   dapagliflozin propanediol (FARXIGA) 5 MG TABS tablet Take 5 mg by mouth daily.   esomeprazole (NEXIUM) 20 MG capsule Take 20 mg by mouth daily at 12 noon.   glucose blood (ONETOUCH VERIO) test strip Use to test blood sugar daily. Dx: E11.9   ketoconazole (NIZORAL) 2 % cream Apply 1 application topically 2 (two) times daily as needed (groin rash).   LORazepam (  ATIVAN) 0.5 MG tablet Take 1 tablet (0.5 mg total) by mouth 3 (three) times daily.   losartan (COZAAR) 100 MG tablet TAKE 1 TABLET BY MOUTH ONCE DAILY   metFORMIN (GLUCOPHAGE) 500 MG tablet Take 500 mg by mouth 2 (two) times daily with a meal.   nortriptyline (PAMELOR) 25 MG capsule Take 50 mg by mouth at bedtime.   [DISCONTINUED] influenza vaccine adjuvanted (FLUAD) 0.5 ML injection Inject into the muscle.   empagliflozin (JARDIANCE) 10 MG TABS tablet Take 1 tablet (10 mg total) by mouth daily before breakfast.   [DISCONTINUED] esomeprazole (NEXIUM) 40 MG capsule Take 1 capsule (40 mg total) by mouth daily. (Patient taking differently: Take 20 mg by mouth daily.)   [DISCONTINUED] famotidine (PEPCID) 20 MG tablet Take 1 tablet (20 mg total) by mouth at bedtime. X 4 weeks then stop   [DISCONTINUED]  metFORMIN (GLUCOPHAGE) 1000 MG tablet TAKE 1 TABLET BY MOUTH  TWICE DAILY WITH MEALS (Patient taking differently: Take 500 mg by mouth 2 (two) times daily with a meal.)   No facility-administered encounter medications on file as of 12/17/2021.    Review of Systems:  Review of Systems  Constitutional:  Negative for activity change and appetite change.  HENT: Negative.    Respiratory:  Negative for cough and shortness of breath.   Cardiovascular:  Negative for leg swelling.  Gastrointestinal:  Negative for constipation.  Genitourinary: Negative.   Musculoskeletal:  Negative for arthralgias, gait problem and myalgias.  Skin: Negative.   Neurological:  Negative for dizziness and weakness.  Psychiatric/Behavioral:  Positive for dysphoric mood. Negative for confusion and sleep disturbance. The patient is nervous/anxious.     Health Maintenance  Topic Date Due   Diabetic kidney evaluation - Urine ACR  02/02/2018   COVID-19 Vaccine (6 - Moderna risk series) 09/24/2021   Pneumonia Vaccine 81+ Years old (3 - PPSV23 or PCV20) 09/07/2022 (Originally 12/21/2014)   OPHTHALMOLOGY EXAM  05/05/2022   FOOT EXAM  05/21/2022   HEMOGLOBIN A1C  06/11/2022   Diabetic kidney evaluation - GFR measurement  12/12/2022   TETANUS/TDAP  11/29/2029   INFLUENZA VACCINE  Completed   DEXA SCAN  Completed   Hepatitis C Screening  Completed   Zoster Vaccines- Shingrix  Completed   HPV VACCINES  Aged Out   COLONOSCOPY (Pts 45-76yr Insurance coverage will need to be confirmed)  Discontinued    Physical Exam: Vitals:   12/17/21 0827  BP: 118/80  Pulse: 83  Temp: 98.4 F (36.9 C)  SpO2: 97%  Weight: 118 lb 6.4 oz (53.7 kg)  Height: '4\' 11"'$  (1.499 m)   Body mass index is 23.91 kg/m. Physical Exam Vitals reviewed.  Constitutional:      Appearance: Normal appearance.  HENT:     Head: Normocephalic.     Nose: Nose normal.     Mouth/Throat:     Mouth: Mucous membranes are moist.     Pharynx: Oropharynx  is clear.  Eyes:     Pupils: Pupils are equal, round, and reactive to light.  Cardiovascular:     Rate and Rhythm: Normal rate and regular rhythm.     Pulses: Normal pulses.     Heart sounds: Normal heart sounds. No murmur heard. Pulmonary:     Effort: Pulmonary effort is normal.     Breath sounds: Normal breath sounds.  Abdominal:     General: Abdomen is flat. Bowel sounds are normal.     Palpations: Abdomen is soft.  Musculoskeletal:  General: No swelling.     Cervical back: Neck supple.  Skin:    General: Skin is warm.  Neurological:     General: No focal deficit present.     Mental Status: She is alert and oriented to person, place, and time.  Psychiatric:        Mood and Affect: Mood normal.        Thought Content: Thought content normal.     Labs reviewed: Basic Metabolic Panel: Recent Labs    01/21/21 0000 05/20/21 0000 08/28/21 0000 12/11/21 0000  NA 143 140 143 142  K 4.9 5.0 4.8 4.7  CL 107 105 108 108  CO2 24* 27* 23* 21  BUN 22*  --  17 20  CREATININE 0.9 1.0 0.8 0.9  CALCIUM 9.5 9.3 9.2 9.2   Liver Function Tests: Recent Labs    01/21/21 0000 08/28/21 0000  AST 14 15  ALT 13 15  ALKPHOS 87 83  ALBUMIN 4.1 4.4   No results for input(s): "LIPASE", "AMYLASE" in the last 8760 hours. No results for input(s): "AMMONIA" in the last 8760 hours. CBC: Recent Labs    01/21/21 0000 08/28/21 0000  WBC 5.8 6.2  HGB 12.5 12.6  HCT 38 38  PLT 366 409*   Lipid Panel: Recent Labs    01/21/21 0000 08/28/21 0000  CHOL 118 139  HDL 60 62  LDLCALC 36 52  TRIG 109 126   Lab Results  Component Value Date   HGBA1C 7.3 12/11/2021    Procedures since last visit: No results found.  Assessment/Plan 1. Controlled type 2 diabetes mellitus with other specified complication, without long-term current use of insulin (HCC) A1C slightly high but Her sugars at home are less then 140 Will repeat A1C in 3 months Continue lower dose of  metformin Planning to switch to [place] instead of Farxiga due to cost  2. Gastroesophageal reflux disease without esophagitis On Nexium now Doing better with her nausea Take indefinite  3. Depression, major, single episode, severe (HCC) Wellbutrin per Psych Also on Nortriptyline   4. Essential hypertension On Cozaar  5. Hyperlipidemia associated with type 2 diabetes mellitus ([place]) On statin LDL good   6. GAD (generalized anxiety disorder) On Xanax and Ativan per Dr Casimiro Needle  7. Esophageal dysphagia Doing better since dilatation Procedure  Labs/tests ordered:  CBC,CMP,A1C Next appt:  03/23/2022

## 2021-12-27 ENCOUNTER — Other Ambulatory Visit: Payer: Self-pay

## 2021-12-27 ENCOUNTER — Emergency Department (HOSPITAL_BASED_OUTPATIENT_CLINIC_OR_DEPARTMENT_OTHER)
Admission: EM | Admit: 2021-12-27 | Discharge: 2021-12-27 | Disposition: A | Discharge status: Home / Self Care | Payer: Medicare Other | Attending: Emergency Medicine | Admitting: Emergency Medicine

## 2021-12-27 ENCOUNTER — Encounter (HOSPITAL_BASED_OUTPATIENT_CLINIC_OR_DEPARTMENT_OTHER): Payer: Self-pay | Admitting: Pediatrics

## 2021-12-27 ENCOUNTER — Emergency Department (HOSPITAL_BASED_OUTPATIENT_CLINIC_OR_DEPARTMENT_OTHER): Payer: Medicare Other | Admitting: Radiology

## 2021-12-27 DIAGNOSIS — R7309 Other abnormal glucose: Secondary | ICD-10-CM | POA: Insufficient documentation

## 2021-12-27 DIAGNOSIS — W01198A Fall on same level from slipping, tripping and stumbling with subsequent striking against other object, initial encounter: Secondary | ICD-10-CM | POA: Diagnosis not present

## 2021-12-27 DIAGNOSIS — S2232XA Fracture of one rib, left side, initial encounter for closed fracture: Secondary | ICD-10-CM | POA: Insufficient documentation

## 2021-12-27 DIAGNOSIS — Y93K1 Activity, walking an animal: Secondary | ICD-10-CM | POA: Insufficient documentation

## 2021-12-27 DIAGNOSIS — S299XXA Unspecified injury of thorax, initial encounter: Secondary | ICD-10-CM | POA: Diagnosis present

## 2021-12-27 LAB — CBG MONITORING, ED: Glucose-Capillary: 113 mg/dL — ABNORMAL HIGH (ref 70–99)

## 2021-12-27 NOTE — ED Provider Notes (Signed)
[facility] EMERGENCY DEPT Provider Note   CSN: [id number] Arrival date & time: 12/27/21  1801     History  Chief Complaint  Patient presents with   Rib Injury    Kimberly [name] is a 76 y.o. female.  HPI Patient reports approximately 1 week ago she tripped and fell while walking her dog.  Patient reports that she hit her left forehead.  She was not knocked out but developed a big bruise.  She also seem to hit her left chest and had pain in the left anterior chest.  Since then there is been a sharp pain with occasional position changes or movements.  She reports it stayed in the same place.  She is not having shortness of breath.  Reports tonight she was at the dining room table and she got the feeling of something moving her out of place under her left breast.  That time she determined to come and get it checked.  She reports that Tylenol is helping when she takes it.    Home Medications Prior to Admission medications   Medication Sig Start Date End Date Taking? Authorizing Provider  acetaminophen (TYLENOL) 500 MG tablet Take 500 mg by mouth every 6 (six) hours as needed.    [provider]  ALPRAZolam Duanne Moron) 0.5 MG tablet Take 1/2 tablet (0.25 mg total) by mouth 2 (two) times daily. Patient taking differently: Take 0.5 mg by mouth 2 (two) times daily. 09/03/21   Royal Hawthorn, NP  atorvastatin (LIPITOR) 20 MG tablet Take 1 tablet (20 mg total) by mouth daily. 10/02/20   Virgie Dad, MD  buPROPion (WELLBUTRIN XL) 150 MG 24 hr tablet Take 150 mg by mouth daily.    [provider]  dapagliflozin propanediol (FARXIGA) 5 MG TABS tablet Take 5 mg by mouth daily.    [provider]  empagliflozin (JARDIANCE) 10 MG TABS tablet Take 1 tablet (10 mg total) by mouth daily before breakfast. Patient taking differently: Take 10 mg by mouth daily before breakfast. Will start once she is done with [place] supply 09/08/21   Royal Hawthorn, NP  esomeprazole  (NEXIUM) 20 MG capsule Take 20 mg by mouth daily at 12 noon.    [provider]  glucose blood (ONETOUCH VERIO) test strip Use to test blood sugar daily. Dx: E11.9 12/18/20   Virgie Dad, MD  ketoconazole (NIZORAL) 2 % cream Apply 1 application topically 2 (two) times daily as needed (groin rash). 12/25/19   Reed, Tiffany L, DO  LORazepam (ATIVAN) 0.5 MG tablet Take 1 tablet (0.5 mg total) by mouth 3 (three) times daily. 09/29/21   Virgie Dad, MD  losartan (COZAAR) 100 MG tablet TAKE 1 TABLET BY MOUTH ONCE DAILY 09/17/21   Virgie Dad, MD  metFORMIN (GLUCOPHAGE) 500 MG tablet Take 500 mg by mouth 2 (two) times daily with a meal.    [provider]  nortriptyline (PAMELOR) 25 MG capsule Take 50 mg by mouth at bedtime. 04/29/20   [provider]      Allergies    Keflex [cephalexin]    Review of Systems   Review of Systems  Physical Exam Updated Vital Signs BP (!) 161/83 (BP Location: Right Arm)   Pulse 93   Temp 97.7 F (36.5 C) (Oral)   Resp 20   Ht '4\' 11"'$  (1.499 m)   Wt 53.7 kg   SpO2 98%   BMI 23.91 kg/m  Physical Exam Constitutional:  Comments: Patient is alert and nontoxic.  Sitting up at the edge of the stretcher.  No respiratory distress.  HENT:     Head:     Comments: Patient has a resolving hematoma on the left forehead.  It is nearly gone at this time with some faint yellowish-brown discoloration.    Mouth/Throat:     Pharynx: Oropharynx is clear.  Eyes:     Extraocular Movements: Extraocular movements intact.  Cardiovascular:     Rate and Rhythm: Normal rate and regular rhythm.  Pulmonary:     Effort: Pulmonary effort is normal.     Breath sounds: Normal breath sounds.     Comments: There is a focus of discomfort on the left anterior chest wall just below the breast.  No crepitus or soft tissue changes to palpation. Abdominal:     General: There is no distension.     Palpations: Abdomen is soft.     Tenderness: There is no  abdominal tenderness. There is no guarding.  Musculoskeletal:        General: No swelling or tenderness. Normal range of motion.     Right lower leg: No edema.     Left lower leg: No edema.     Comments: No edema of the lower extremities.  Calves are soft and nontender.  Skin:    General: Skin is warm and dry.  Neurological:     General: No focal deficit present.     Mental Status: She is oriented to person, place, and time.     Coordination: Coordination normal.     ED Results / Procedures / Treatments   Labs (all labs ordered are listed, but only abnormal results are displayed) Labs Reviewed  CBG MONITORING, ED - Abnormal; Notable for the following components:      Result Value   Glucose-Capillary 113 (*)    All other components within normal limits    EKG None  Radiology DG Ribs Unilateral W/Chest Left  Result Date: 12/27/2021 CLINICAL DATA:  Fall 1 week ago.  Left rib and chest wall pain. EXAM: LEFT RIBS AND CHEST - 3+ VIEW COMPARISON:  None Available. FINDINGS: Nondisplaced fracture is seen involving the left anterior 7th rib. There is no evidence of pneumothorax or pleural effusion. Mild atelectasis or scarring is noted in the left lung base. Tiny calcified granuloma seen in the left upper lobe. Lungs are otherwise clear. Heart size and mediastinal contours are within normal limits. IMPRESSION: Nondisplaced fracture of left anterior 7th rib. No evidence of pneumothorax or pleural effusion. Mild left basilar atelectasis versus scarring. Electronically Signed   By: Marlaine Hind M.D.   On: 12/27/2021 18:43    Procedures Procedures    Medications Ordered in ED Medications - No data to display  ED Course/ Medical Decision Making/ A&P                           Medical Decision Making Amount and/or Complexity of Data Reviewed Radiology: ordered.   Patient presents as outlined.  She reports a fall approximately a week ago.  Is a mechanical fall.  She has a focus of pain  on the left chest wall but no shortness of breath.  Chest x-ray reviewed by radiology shows a focal seventh rib fracture nondisplaced.  This is consistent with the patient's findings.  She has a resolving hematoma on the forehead but no complaints of headache or confusion.  She reports she does get  adequate pain relief with Tylenol.  She also reports she has been dealing with a lot of emotional grief and stress with her husband's death about a year and a half ago.  She reports her physicians are treating her and giving her medications for this.  Reports she just got worried when she continued to have this pain in her chest after the fall.  This time she feels stable for returning to [facility] and continuing home management.  We have extensively reviewed return precautions for fever, worsening pain, shortness of breath or other concerning changes.        Final Clinical Impression(s) / ED Diagnoses Final diagnoses:  Closed fracture of one rib of left side, initial encounter    Rx / DC Orders ED Discharge Orders     None         Charlesetta Shanks, MD 12/27/21 2240

## 2021-12-27 NOTE — Discharge Instructions (Signed)
1.  You may take extra strength Tylenol every 6 hours as needed for pain. 2.  1 you have a broken rib, it is good to continue moving and taking deep breaths.  Being sedentary and not taking deep breaths increases risk of developing pneumonia.  At this time your chest x-ray shows a single broken rib on the left side where you have pain.  There is no evidence of a pneumonia or collapsed lung at this time.  However, if you start developing shortness of breath, fever or other concerning symptoms, return to the emergency department immediately.

## 2021-12-27 NOTE — ED Notes (Signed)
PT anxious and tearful in room. Pt pacing in room. Dr. Johnney Killian made aware.

## 2021-12-27 NOTE — ED Triage Notes (Signed)
Reported she fell a week ago, last Thursday and has some rib pain;

## 2022-01-05 ENCOUNTER — Encounter: Payer: Self-pay | Admitting: Adult Health

## 2022-01-05 ENCOUNTER — Non-Acute Institutional Stay: Payer: Medicare Other | Admitting: Adult Health

## 2022-01-05 VITALS — BP 126/82 | HR 90 | Temp 98.1°F | Ht 59.0 in | Wt 121.0 lb

## 2022-01-05 DIAGNOSIS — S0003XA Contusion of scalp, initial encounter: Secondary | ICD-10-CM

## 2022-01-05 DIAGNOSIS — S2232XA Fracture of one rib, left side, initial encounter for closed fracture: Secondary | ICD-10-CM

## 2022-01-05 NOTE — Progress Notes (Signed)
Location:  [facility]  POS: clinic  Provider:  Cindi Carbon, [place] [phone]   Code Status: Full code Goals of Care:     12/27/2021    6:11 PM  Advanced Directives  Does Patient Have a Medical Advance Directive? No  Would patient like information on creating a medical advance directive? No - Patient declined     Chief Complaint  Patient presents with   Acute Visit    ER Follow up    HPI: Patient is a 76 y.o. [name] seen today for ER follow up   She tripped and fell while walking her dog two weeks ago. She was seen in the ER due to rib pain and found to have fractured the left 7 th rib which was non displaced on xray.  She reports that she hit the left side of her forehead and had a small lump. No weakness, dizziness ,etc.  Ambulating well. No other complaints.  No cough sob or fever. Wants to get checked out and see if she has pna due to risk with rib fracture.      Past Medical History:  Diagnosis Date   Allergic rhinitis    Allergy    Anemia    as child    Anxiety    Arthritis    Atrophic vaginitis 2017   Cancer ([place])    MOHS nose- skin cancer- basal cell    Cataract    removed both eyes    Depression    Diabetes mellitus without complication ([place])    GERD (gastroesophageal reflux disease)    Heart murmur    as child- outgrown    Hematuria 05/09/2015   had two nonobstructing renal calculi   Hepatitis A 1970s   Hyperlipidemia    Hypertension    Osteopenia    Osteoporosis    PONV (postoperative nausea and vomiting)    Shingles 06/04/2015   SUI (stress urinary incontinence, [name]) 2017   Thrombocytopenia ([place])     Past Surgical History:  Procedure Laterality Date   BACK SURGERY  11/2018   CATARACT EXTRACTION W/ INTRAOCULAR LENS  IMPLANT, BILATERAL Bilateral    COLONOSCOPY     KNEE ARTHROSCOPY Left 2004, 1990s   TONSILLECTOMY AND ADENOIDECTOMY  1952   UPPER GASTROINTESTINAL ENDOSCOPY      Allergies  Allergen  Reactions   Keflex [Cephalexin]     rash    Outpatient Encounter Medications as of 01/05/2022  Medication Sig   acetaminophen (TYLENOL) 500 MG tablet Take 500 mg by mouth every 6 (six) hours as needed.   ALPRAZolam (XANAX) 0.5 MG tablet Take 0.5 mg by mouth 2 (two) times daily.   atorvastatin (LIPITOR) 20 MG tablet Take 1 tablet (20 mg total) by mouth daily.   buPROPion (WELLBUTRIN XL) 150 MG 24 hr tablet Take 150 mg by mouth daily.   dapagliflozin propanediol (FARXIGA) 5 MG TABS tablet Take 5 mg by mouth daily.   esomeprazole (NEXIUM) 20 MG capsule Take 20 mg by mouth daily at 12 noon.   glucose blood (ONETOUCH VERIO) test strip Use to test blood sugar daily. Dx: E11.9   ketoconazole (NIZORAL) 2 % cream Apply 1 application topically 2 (two) times daily as needed (groin rash).   LORazepam (ATIVAN) 0.5 MG tablet Take 1 tablet (0.5 mg total) by mouth 3 (three) times daily.   losartan (COZAAR) 100 MG tablet TAKE 1 TABLET BY MOUTH ONCE DAILY   metFORMIN (GLUCOPHAGE) 500 MG tablet Take 500 mg  by mouth 2 (two) times daily with a meal.   nortriptyline (PAMELOR) 25 MG capsule Take 50 mg by mouth at bedtime.   [DISCONTINUED] ALPRAZolam (XANAX) 0.5 MG tablet Take 1/2 tablet (0.25 mg total) by mouth 2 (two) times daily. (Patient taking differently: Take 0.5 mg by mouth 2 (two) times daily.)   [DISCONTINUED] empagliflozin (JARDIANCE) 10 MG TABS tablet Take 1 tablet (10 mg total) by mouth daily before breakfast. (Patient taking differently: Take 10 mg by mouth daily before breakfast. Will start once she is done with [place] supply)   No facility-administered encounter medications on file as of 01/05/2022.    Review of Systems:  Review of Systems  Constitutional:  Negative for activity change, appetite change, chills, diaphoresis, fatigue, fever and unexpected weight change.  HENT:  Negative for congestion.   Respiratory:  Negative for cough, shortness of breath and wheezing.        Pain with  respiration on the left side  Cardiovascular:  Negative for chest pain, palpitations and leg swelling.  Gastrointestinal:  Negative for abdominal distention, abdominal pain, constipation and diarrhea.  Genitourinary:  Negative for difficulty urinating and dysuria.  Musculoskeletal:  Negative for back pain and gait problem.  Neurological:  Negative for dizziness, tremors, seizures, syncope, facial asymmetry, speech difficulty, weakness, light-headedness, numbness and headaches.    Health Maintenance  Topic Date Due   Diabetic kidney evaluation - Urine ACR  02/02/2018   COVID-19 Vaccine (6 - Moderna risk series) 09/24/2021   Pneumonia Vaccine 22+ Years old (3 - PPSV23 or PCV20) 09/07/2022 (Originally 12/21/2014)   OPHTHALMOLOGY EXAM  05/05/2022   FOOT EXAM  05/21/2022   HEMOGLOBIN A1C  06/11/2022   Diabetic kidney evaluation - GFR measurement  12/12/2022   TETANUS/TDAP  11/29/2029   INFLUENZA VACCINE  Completed   DEXA SCAN  Completed   Hepatitis C Screening  Completed   Zoster Vaccines- Shingrix  Completed   HPV VACCINES  Aged Out   COLONOSCOPY (Pts 45-83yr Insurance coverage will need to be confirmed)  Discontinued    Physical Exam: Vitals:   01/05/22 1439  BP: 126/82  Pulse: 90  Temp: 98.1 F (36.7 C)  SpO2: 99%  Weight: 121 lb (54.9 kg)  Height: 'Kimberly [name]\' 11"'$  (1.499 m)   Body mass index is 24.44 kg/m. Physical Exam Vitals and nursing note reviewed.  Constitutional:      General: She is not in acute distress.    Appearance: She is not diaphoretic.  HENT:     Head: Normocephalic and atraumatic.  Neck:     Vascular: No JVD.  Cardiovascular:     Rate and Rhythm: Normal rate and regular rhythm.     Heart sounds: No murmur heard. Pulmonary:     Effort: Pulmonary effort is normal. No respiratory distress.     Breath sounds: Normal breath sounds. No wheezing or rales.  Chest:     Chest wall: Tenderness (left, no crepitus) present.  Skin:    General: Skin is warm and dry.   Neurological:     Mental Status: She is alert and oriented to person, place, and time.     Labs reviewed: Basic Metabolic Panel: Recent Labs    01/21/21 0000 05/20/21 0000 08/28/21 0000 12/11/21 0000  NA 143 140 143 142  K Kimberly [name] [name] 5.0 Kimberly [name] [name] Kimberly [name] [name]  CL 107 105 108 108  CO2 24* 27* 23* 21  BUN 22*  --  17 20  CREATININE 0.9 1.0 0.8 0.9  CALCIUM 9.5 9.3 9.2  9.2   Liver Function Tests: Recent Labs    01/21/21 0000 08/28/21 0000  AST 14 15  ALT 13 15  ALKPHOS 87 83  ALBUMIN Kimberly [name].1 Kimberly [name].Kimberly [name]   No results for input(s): "LIPASE", "AMYLASE" in the last 8760 hours. No results for input(s): "AMMONIA" in the last 8760 hours. CBC: Recent Labs    01/21/21 0000 08/28/21 0000  WBC 5.8 6.2  HGB 12.5 12.6  HCT 38 38  PLT 366 409*   Lipid Panel: Recent Labs    01/21/21 0000 08/28/21 0000  CHOL 118 139  HDL 60 62  LDLCALC 36 52  TRIG 109 126   Lab Results  Component Value Date   HGBA1C 7.3 12/11/2021    Procedures since last visit: DG Ribs Unilateral W/Chest Left  Result Date: 12/27/2021 CLINICAL DATA:  Fall 1 week ago.  Left rib and chest wall pain. EXAM: LEFT RIBS AND CHEST - 3+ VIEW COMPARISON:  None Available. FINDINGS: Nondisplaced fracture is seen involving the left anterior 7th rib. There is no evidence of pneumothorax or pleural effusion. Mild atelectasis or scarring is noted in the left lung base. Tiny calcified granuloma seen in the left upper lobe. Lungs are otherwise clear. Heart size and mediastinal contours are within normal limits. IMPRESSION: Nondisplaced fracture of left anterior 7th rib. No evidence of pneumothorax or pleural effusion. Mild left basilar atelectasis versus scarring. Electronically Signed   By: Marlaine Hind M.D.   On: 12/27/2021 18:43    Assessment/Plan  1. Closed fracture of one rib of left side, initial encounter No crepitus and normal breath sounds Encourage deep breathing 3-Kimberly [name] times a day 10 breaths RTC if cough, sob, fever.   2. Contusion of  scalp, initial encounter Mild Neuro checks are normal  Labs/tests ordered:  * No order type specified * Next appt:  03/23/2022   Total time 59mn:  time greater than 50% of total time spent doing pt counseling and coordination of care

## 2022-01-19 ENCOUNTER — Other Ambulatory Visit: Payer: Self-pay | Admitting: Adult Health

## 2022-01-19 MED ORDER — METFORMIN HCL 500 MG PO TABS: 1000.0000 mg | ORAL_TABLET | Freq: Two times a day (BID) | ORAL | 1 refills | Status: DC

## 2022-01-20 ENCOUNTER — Other Ambulatory Visit (HOSPITAL_BASED_OUTPATIENT_CLINIC_OR_DEPARTMENT_OTHER): Payer: Self-pay

## 2022-01-20 ENCOUNTER — Other Ambulatory Visit: Payer: Self-pay | Admitting: *Deleted

## 2022-01-20 MED ORDER — ESOMEPRAZOLE MAGNESIUM 20 MG PO CPDR: 20.0000 mg | DELAYED_RELEASE_CAPSULE | Freq: Every day | ORAL | 11 refills | Status: AC

## 2022-01-20 MED ORDER — COMIRNATY 30 MCG/0.3ML IM SUSY
PREFILLED_SYRINGE | INTRAMUSCULAR | 0 refills | Status: DC
  Filled 2022-01-20: qty 0.3, 1d supply, fill #0

## 2022-01-20 NOTE — Telephone Encounter (Signed)
[place], [facility] Nurse requested refill to be sent to [facility].

## 2022-01-27 ENCOUNTER — Other Ambulatory Visit: Payer: Self-pay | Admitting: *Deleted

## 2022-01-27 MED ORDER — LORAZEPAM 0.5 MG PO TABS: 0.5000 mg | ORAL_TABLET | Freq: Three times a day (TID) | ORAL | 1 refills | Status: DC

## 2022-01-27 NOTE — Telephone Encounter (Signed)
Nira Conn, Clinic Nurse with [facility] requested refill.  Epic LR:09/29/2021 Pended Rx and sent to Dr. Lyndel Safe for approval.

## 2022-02-03 ENCOUNTER — Other Ambulatory Visit: Payer: Self-pay | Admitting: Orthopedic Surgery

## 2022-02-03 ENCOUNTER — Encounter: Payer: Self-pay | Admitting: Internal Medicine

## 2022-02-03 DIAGNOSIS — E119 Type 2 diabetes mellitus without complications: Secondary | ICD-10-CM | POA: Diagnosis not present

## 2022-02-03 LAB — HM DIABETES EYE EXAM

## 2022-02-03 MED ORDER — ALPRAZOLAM 0.5 MG PO TABS: 0.5000 mg | ORAL_TABLET | Freq: Two times a day (BID) | ORAL | 0 refills | Status: DC

## 2022-03-04 ENCOUNTER — Other Ambulatory Visit: Payer: Self-pay | Admitting: Orthopedic Surgery

## 2022-03-04 DIAGNOSIS — F419 Anxiety disorder, unspecified: Secondary | ICD-10-CM

## 2022-03-04 MED ORDER — ALPRAZOLAM 0.5 MG PO TABS: 0.5000 mg | ORAL_TABLET | Freq: Two times a day (BID) | ORAL | 0 refills | Status: DC

## 2022-03-12 DIAGNOSIS — E1169 Type 2 diabetes mellitus with other specified complication: Secondary | ICD-10-CM | POA: Diagnosis not present

## 2022-03-12 LAB — CBC AND DIFFERENTIAL
HCT: 39 (ref 36–46)
Hemoglobin: 12.7 (ref 12.0–16.0)
Platelets: 414 10*3/uL — AB (ref 150–400)
WBC: 6.1

## 2022-03-12 LAB — CBC: RBC: 4.18 (ref 3.87–5.11)

## 2022-03-23 ENCOUNTER — Non-Acute Institutional Stay: Payer: Medicare Other | Admitting: Adult Health

## 2022-03-23 ENCOUNTER — Encounter: Payer: Self-pay | Admitting: Adult Health

## 2022-03-23 VITALS — BP 126/84 | HR 101 | Temp 97.6°F | Resp 18 | Ht 60.0 in | Wt 120.4 lb

## 2022-03-23 DIAGNOSIS — K449 Diaphragmatic hernia without obstruction or gangrene: Secondary | ICD-10-CM | POA: Diagnosis not present

## 2022-03-23 DIAGNOSIS — F411 Generalized anxiety disorder: Secondary | ICD-10-CM | POA: Diagnosis not present

## 2022-03-23 DIAGNOSIS — E785 Hyperlipidemia, unspecified: Secondary | ICD-10-CM | POA: Diagnosis not present

## 2022-03-23 DIAGNOSIS — F322 Major depressive disorder, single episode, severe without psychotic features: Secondary | ICD-10-CM

## 2022-03-23 DIAGNOSIS — E1169 Type 2 diabetes mellitus with other specified complication: Secondary | ICD-10-CM

## 2022-03-23 DIAGNOSIS — E119 Type 2 diabetes mellitus without complications: Secondary | ICD-10-CM

## 2022-03-23 NOTE — Progress Notes (Signed)
Abstraction  

## 2022-03-23 NOTE — Patient Instructions (Signed)
Think about getting a mammogram

## 2022-03-23 NOTE — Progress Notes (Unsigned)
Location:  [facility]  POS: clinic  Provider:  Cindi Carbon, [place] [id number]   Code Status:  Goals of Care:     03/23/2022    1:44 PM  Advanced Directives  Does Patient Have a Medical Advance Directive? Yes  Type of Advance Directive Living will;Healthcare Power of [place];Out of facility DNR (pink MOST or yellow form)  Does patient want to make changes to medical advance directive? No - Patient declined  Copy of [place] in Chart? Yes - validated most recent copy scanned in chart (See row information)     Chief Complaint  Patient presents with   Medical Management of Chronic Issues    3 month follow up   Quality Metric Gaps    Discussed the need for Awv, Covid vaccine and Diabetic kidney evaluation    HPI: Patient is a 76 y.o. female seen today for medical management of chronic diseases.    A1c 7.2, on farxiga and metformin  Lost 30 lbs unintentionally since her husband passed away. She is trying to eat more and gain weight  Has tingling in her toes.   Continues with depression and anxiety and followed by Dr. Casimiro Needle. Was taking ativan bid but now is tid and she says this has really helped. Has apt with Psych this week.   GERD/dysphagia EGD April of 2023 showed a hiatal hernia and moderate stenosis which was dilated. Denies any swallowing issues at this time.   BMD 03/07/20 T score -1.2.  Declined further testing.   Mammogram 01/23/19 neg, not sure if she wants to go back.   Says she will have a colonoscopy Past Medical History:  Diagnosis Date   Allergic rhinitis    Allergy    Anemia    as child    Anxiety    Arthritis    Atrophic vaginitis 2017   Cancer ([place])    MOHS nose- skin cancer- basal cell    Cataract    removed both eyes    Depression    Diabetes mellitus without complication (HCC)    GERD (gastroesophageal reflux disease)    Heart murmur    as child- outgrown    Hematuria 05/09/2015   had  two nonobstructing renal calculi   Hepatitis A 1970s   Hyperlipidemia    Hypertension    Osteopenia    Osteoporosis    PONV (postoperative nausea and vomiting)    Shingles 06/04/2015   SUI (stress urinary incontinence, female) 2017   Thrombocytopenia ([place])     Past Surgical History:  Procedure Laterality Date   BACK SURGERY  11/2018   CATARACT EXTRACTION W/ INTRAOCULAR LENS  IMPLANT, BILATERAL Bilateral    COLONOSCOPY     KNEE ARTHROSCOPY Left 2004, 1990s   TONSILLECTOMY AND ADENOIDECTOMY  1952   UPPER GASTROINTESTINAL ENDOSCOPY      Allergies  Allergen Reactions   Keflex [Cephalexin]     rash    Outpatient Encounter Medications as of 03/23/2022  Medication Sig   acetaminophen (TYLENOL) 500 MG tablet Take 500 mg by mouth every 6 (six) hours as needed.   ALPRAZolam (XANAX) 0.5 MG tablet Take 1 tablet (0.5 mg total) by mouth 2 (two) times daily.   atorvastatin (LIPITOR) 20 MG tablet Take 1 tablet (20 mg total) by mouth daily.   buPROPion (WELLBUTRIN XL) 150 MG 24 hr tablet Take 150 mg by mouth daily.   COVID-19 mRNA vaccine 2023-2024 (COMIRNATY) syringe Inject into the muscle.  dapagliflozin propanediol (FARXIGA) 5 MG TABS tablet Take 5 mg by mouth daily.   esomeprazole (NEXIUM) 20 MG capsule Take 1 capsule (20 mg total) by mouth daily at 12 noon.   glucose blood (ONETOUCH VERIO) test strip Use to test blood sugar daily. Dx: E11.9   ketoconazole (NIZORAL) 2 % cream Apply 1 application topically 2 (two) times daily as needed (groin rash).   LORazepam (ATIVAN) 0.5 MG tablet Take 1 tablet (0.5 mg total) by mouth 3 (three) times daily.   losartan (COZAAR) 100 MG tablet TAKE 1 TABLET BY MOUTH ONCE DAILY   metFORMIN (GLUCOPHAGE) 500 MG tablet Take 2 tablets (1,000 mg total) by mouth 2 (two) times daily with a meal.   nortriptyline (PAMELOR) 25 MG capsule Take 50 mg by mouth at bedtime.   [DISCONTINUED] ALPRAZolam (XANAX) 0.5 MG tablet Take 1/2 tablet (0.25 mg total) by mouth 2  (two) times daily. (Patient taking differently: Take 0.5 mg by mouth 2 (two) times daily.)   [DISCONTINUED] empagliflozin (JARDIANCE) 10 MG TABS tablet Take 1 tablet (10 mg total) by mouth daily before breakfast. (Patient taking differently: Take 10 mg by mouth daily before breakfast. Will start once she is done with [place] supply)   [DISCONTINUED] esomeprazole (NEXIUM) 20 MG capsule Take 20 mg by mouth daily at 12 noon.   [DISCONTINUED] LORazepam (ATIVAN) 0.5 MG tablet Take 1 tablet (0.5 mg total) by mouth 3 (three) times daily.   [DISCONTINUED] metFORMIN (GLUCOPHAGE) 500 MG tablet Take 500 mg by mouth 2 (two) times daily with a meal.   No facility-administered encounter medications on file as of 03/23/2022.    Review of Systems:  Review of Systems  Constitutional:  Negative for activity change, appetite change, chills, diaphoresis, fatigue, fever and unexpected weight change.  HENT:  Negative for congestion.   Respiratory:  Negative for cough, shortness of breath and wheezing.   Cardiovascular:  Negative for chest pain, palpitations and leg swelling.  Gastrointestinal:  Negative for abdominal distention, abdominal pain, constipation and diarrhea.  Genitourinary:  Negative for difficulty urinating and dysuria.  Musculoskeletal:  Negative for arthralgias, back pain, gait problem, joint swelling and myalgias.  Neurological:  Negative for dizziness, tremors, seizures, syncope, facial asymmetry, speech difficulty, weakness, light-headedness, numbness and headaches.       Tingling in feet  Psychiatric/Behavioral:  Positive for decreased concentration and dysphoric mood. Negative for agitation, behavioral problems and confusion. The patient is nervous/anxious.     Health Maintenance  Topic Date Due   Diabetic kidney evaluation - Urine ACR  09/14/2014   Medicare Annual Wellness (AWV)  06/17/2021   COVID-19 Vaccine (7 - 2023-24 season) 03/17/2022   Pneumonia Vaccine 17+ Years old (3 - PPSV23 or  PCV20) 09/07/2022 (Originally 12/21/2018)   FOOT EXAM  05/21/2022   HEMOGLOBIN A1C  06/11/2022   Diabetic kidney evaluation - eGFR measurement  12/12/2022   OPHTHALMOLOGY EXAM  02/04/2023   DTaP/Tdap/Td (3 - Td or Tdap) 11/29/2029   INFLUENZA VACCINE  Completed   DEXA SCAN  Completed   Hepatitis C Screening  Completed   Zoster Vaccines- Shingrix  Completed   HPV VACCINES  Aged Out   COLONOSCOPY (Pts 45-54yr Insurance coverage will need to be confirmed)  Discontinued    Physical Exam: Vitals:   03/23/22 1339  BP: 126/84  Pulse: (!) 101  Resp: 18  Temp: 97.6 F (36.4 C)  TempSrc: Temporal  SpO2: 95%  Weight: 120 lb 6.4 oz (54.6 kg)  Height: 5' (1.524 m)  Body mass index is 23.51 kg/m. Physical Exam Vitals and nursing note reviewed.  Constitutional:      General: She is not in acute distress.    Appearance: She is not diaphoretic.  HENT:     Head: Normocephalic and atraumatic.  Neck:     Vascular: No JVD.  Cardiovascular:     Rate and Rhythm: Normal rate and regular rhythm.     Pulses:          Dorsalis pedis pulses are 2+ on the right side and 2+ on the left side.     Heart sounds: No murmur heard. Pulmonary:     Effort: Pulmonary effort is normal. No respiratory distress.     Breath sounds: Normal breath sounds. No wheezing.  Musculoskeletal:     Right lower leg: No edema.     Left lower leg: No edema.     Right foot: Normal range of motion. No deformity.     Left foot: Normal range of motion. No deformity.  Feet:     Right foot:     Protective Sensation: 4 sites tested.  4 sites sensed.     Skin integrity: Skin integrity normal.     Left foot:     Protective Sensation: 4 sites tested.  4 sites sensed.     Skin integrity: Skin integrity normal.  Skin:    General: Skin is warm and dry.  Neurological:     Mental Status: She is alert and oriented to person, place, and time.     Labs reviewed: Basic Metabolic Panel: Recent Labs    05/20/21 0000  08/28/21 0000 12/11/21 0000  NA 140 143 142  K 5.0 4.8 4.7  CL 105 108 108  CO2 27* 23* 21  BUN  --  17 20  CREATININE 1.0 0.8 0.9  CALCIUM 9.3 9.2 9.2   Liver Function Tests: Recent Labs    08/28/21 0000  AST 15  ALT 15  ALKPHOS 83  ALBUMIN 4.4   No results for input(s): "LIPASE", "AMYLASE" in the last 8760 hours. No results for input(s): "AMMONIA" in the last 8760 hours. CBC: Recent Labs    08/28/21 0000 03/12/22 0000  WBC 6.2 6.1  HGB 12.6 12.7  HCT 38 39  PLT 409* 414*   Lipid Panel: Recent Labs    08/28/21 0000  CHOL 139  HDL 62  LDLCALC 52  TRIG 126   Lab Results  Component Value Date   HGBA1C 7.3 12/11/2021    Procedures since last visit: No results found.  Assessment/Plan  1. Type 2 diabetes mellitus with diabetic polyneuropathy, without long-term current use of insulin ([place]) D/C farxiga due to cost and try jardiance Continue metformin A1C in 3 months  Foot exam done  2. Hyperlipidemia associated with type 2 diabetes mellitus [facility]) Lab Results  Component Value Date   LDLCALC 52 08/28/2021  Continue lipitor   3. GAD (generalized anxiety disorder) Improved per pt Continue ativan and xanax recommended by Dr. Casimiro Needle Signed controlled substance contrat  4. Depression, major, single episode, severe ([place]) On Pamelor and wellbutrin  Improving over time but continues with periods of anxiety   5. Essential hypertension Controlled Continue current meds.   6. Gastroesophageal reflux disease without esophagitis Continue nexium   7. Hiatal hernia On EGD 07/2021   Labs/tests ordered:  * No order type specified * BMP A1C prior to apt Next appt:  4 months with Dr Lyndel Safe      Total time  56mn:  time greater than 50% of total time spent doing pt counseling and coordination of care

## 2022-03-24 MED ORDER — METFORMIN HCL 500 MG PO TABS: 500.0000 mg | ORAL_TABLET | Freq: Two times a day (BID) | ORAL | Status: DC

## 2022-04-08 DIAGNOSIS — M1711 Unilateral primary osteoarthritis, right knee: Secondary | ICD-10-CM | POA: Diagnosis not present

## 2022-04-08 DIAGNOSIS — M170 Bilateral primary osteoarthritis of knee: Secondary | ICD-10-CM | POA: Diagnosis not present

## 2022-04-17 DIAGNOSIS — M62561 Muscle wasting and atrophy, not elsewhere classified, right lower leg: Secondary | ICD-10-CM | POA: Diagnosis not present

## 2022-04-17 DIAGNOSIS — M170 Bilateral primary osteoarthritis of knee: Secondary | ICD-10-CM | POA: Diagnosis not present

## 2022-04-17 DIAGNOSIS — M25561 Pain in right knee: Secondary | ICD-10-CM | POA: Diagnosis not present

## 2022-04-17 DIAGNOSIS — M62562 Muscle wasting and atrophy, not elsewhere classified, left lower leg: Secondary | ICD-10-CM | POA: Diagnosis not present

## 2022-04-21 DIAGNOSIS — M79671 Pain in right foot: Secondary | ICD-10-CM | POA: Diagnosis not present

## 2022-04-21 DIAGNOSIS — E1140 Type 2 diabetes mellitus with diabetic neuropathy, unspecified: Secondary | ICD-10-CM | POA: Diagnosis not present

## 2022-04-21 DIAGNOSIS — M79672 Pain in left foot: Secondary | ICD-10-CM | POA: Diagnosis not present

## 2022-04-22 DIAGNOSIS — M62562 Muscle wasting and atrophy, not elsewhere classified, left lower leg: Secondary | ICD-10-CM | POA: Diagnosis not present

## 2022-04-22 DIAGNOSIS — M170 Bilateral primary osteoarthritis of knee: Secondary | ICD-10-CM | POA: Diagnosis not present

## 2022-04-22 DIAGNOSIS — M25561 Pain in right knee: Secondary | ICD-10-CM | POA: Diagnosis not present

## 2022-04-22 DIAGNOSIS — M62561 Muscle wasting and atrophy, not elsewhere classified, right lower leg: Secondary | ICD-10-CM | POA: Diagnosis not present

## 2022-04-29 DIAGNOSIS — M62561 Muscle wasting and atrophy, not elsewhere classified, right lower leg: Secondary | ICD-10-CM | POA: Diagnosis not present

## 2022-04-29 DIAGNOSIS — M170 Bilateral primary osteoarthritis of knee: Secondary | ICD-10-CM | POA: Diagnosis not present

## 2022-04-29 DIAGNOSIS — M62562 Muscle wasting and atrophy, not elsewhere classified, left lower leg: Secondary | ICD-10-CM | POA: Diagnosis not present

## 2022-04-29 DIAGNOSIS — M25561 Pain in right knee: Secondary | ICD-10-CM | POA: Diagnosis not present

## 2022-05-05 ENCOUNTER — Other Ambulatory Visit: Payer: Self-pay | Admitting: Orthopedic Surgery

## 2022-05-05 DIAGNOSIS — F419 Anxiety disorder, unspecified: Secondary | ICD-10-CM

## 2022-05-05 MED ORDER — ALPRAZOLAM 0.5 MG PO TABS: 0.5000 mg | ORAL_TABLET | Freq: Two times a day (BID) | ORAL | 0 refills | Status: DC

## 2022-05-05 MED ORDER — LORAZEPAM 0.5 MG PO TABS: 0.5000 mg | ORAL_TABLET | Freq: Three times a day (TID) | ORAL | 1 refills | Status: DC

## 2022-05-06 DIAGNOSIS — M170 Bilateral primary osteoarthritis of knee: Secondary | ICD-10-CM | POA: Diagnosis not present

## 2022-05-21 ENCOUNTER — Telehealth: Payer: Medicare Other | Admitting: *Deleted

## 2022-05-21 MED ORDER — METFORMIN HCL 1000 MG PO TABS: 1000.0000 mg | ORAL_TABLET | Freq: Two times a day (BID) | ORAL | 3 refills | Status: AC

## 2022-05-21 NOTE — Telephone Encounter (Signed)
Patient called and stated that at her last appointment with Cindi Carbon she changed patient's Metformin from 103m twice daily to 5051mtwice daily.   Patient's stated that she doesn't understand why it was changed. Stated that her blood sugars have been running high and she was stable on the 100077mwice daily.   Stated that she was not told that it was going to be cut in half. BS this Morning 250  Please Advise. (Patient requested message to be sent to Dr. GupLyndel Safe

## 2022-05-21 NOTE — Telephone Encounter (Signed)
Patient notified and agreed.  

## 2022-05-21 NOTE — Telephone Encounter (Signed)
I am changing it back to 2 tabs BID. You can let her know that I am calling in prescription to [facility].

## 2022-05-21 NOTE — Telephone Encounter (Signed)
Call returned to patient and/or family member, a detailed message was left with the providers reply.

## 2022-06-02 ENCOUNTER — Other Ambulatory Visit: Payer: Self-pay | Admitting: Orthopedic Surgery

## 2022-06-02 DIAGNOSIS — F419 Anxiety disorder, unspecified: Secondary | ICD-10-CM

## 2022-06-02 MED ORDER — ALPRAZOLAM 0.5 MG PO TABS: 0.5000 mg | ORAL_TABLET | Freq: Two times a day (BID) | ORAL | 0 refills | Status: DC

## 2022-06-02 MED ORDER — LORAZEPAM 0.5 MG PO TABS: 0.5000 mg | ORAL_TABLET | Freq: Three times a day (TID) | ORAL | 1 refills | Status: DC

## 2022-06-04 ENCOUNTER — Other Ambulatory Visit (HOSPITAL_BASED_OUTPATIENT_CLINIC_OR_DEPARTMENT_OTHER): Payer: Self-pay

## 2022-06-04 MED ORDER — AREXVY 120 MCG/0.5ML IM SUSR
INTRAMUSCULAR | 0 refills | Status: DC
  Filled 2022-06-04: qty 0.5, 1d supply, fill #0

## 2022-06-23 ENCOUNTER — Encounter: Payer: Self-pay | Admitting: Internal Medicine

## 2022-06-23 ENCOUNTER — Non-Acute Institutional Stay: Payer: Medicare Other | Admitting: Internal Medicine

## 2022-06-23 VITALS — BP 128/78 | HR 110 | Temp 97.9°F | Resp 17 | Ht 60.0 in

## 2022-06-23 DIAGNOSIS — E119 Type 2 diabetes mellitus without complications: Secondary | ICD-10-CM

## 2022-06-23 DIAGNOSIS — G3184 Mild cognitive impairment, so stated: Secondary | ICD-10-CM | POA: Diagnosis not present

## 2022-06-23 DIAGNOSIS — F419 Anxiety disorder, unspecified: Secondary | ICD-10-CM

## 2022-06-23 DIAGNOSIS — E1169 Type 2 diabetes mellitus with other specified complication: Secondary | ICD-10-CM | POA: Diagnosis not present

## 2022-06-23 DIAGNOSIS — E785 Hyperlipidemia, unspecified: Secondary | ICD-10-CM

## 2022-06-24 NOTE — Progress Notes (Signed)
Location: [place] of Service:  Clinic (12)  Provider:   Code Status:  Goals of Care:     06/23/2022    3:32 PM  Advanced Directives  Does Patient Have a Medical Advance Directive? Yes  Type of Paramedic of [place];Living will;Out of facility DNR (pink MOST or yellow form)     Chief Complaint  Patient presents with   Acute Visit    Patient would like to be seen for memory and referral     HPI: Patient is a 77 y.o. female seen today for an acute visit for Cognitive impairment  [place]  Patient has h/o Hypertension, Type 2 Diabetes, and Cervical Radiculopathy and Dysphagia   Depression and Anxiety Lost husband  2 years ago Since then struggling with depression Sees [place] and in house Psychologist Continues to be on Ativan and Xanax Unable to Taper   Son with her today He said he has noticed steady decline in her cognition Repeating herself Not Remembering Names Forgetting things and discussion they have had. Lot of things she does not remember since her husband death Very Anxious Cries very easily Has stopped driving due to having issues  Finances mostly managed by Son now   Past Medical History:  Diagnosis Date   Allergic rhinitis    Allergy    Anemia    as child    Anxiety    Arthritis    Atrophic vaginitis 2017   Cancer ([place])    MOHS nose- skin cancer- basal cell    Cataract    removed both eyes    Depression    Diabetes mellitus without complication ([facility])    GERD (gastroesophageal reflux disease)    Heart murmur    as child- outgrown    Hematuria 05/09/2015   had two nonobstructing renal calculi   Hepatitis A 1970s   Hyperlipidemia    Hypertension    Osteopenia    Osteoporosis    PONV (postoperative nausea and vomiting)    Shingles 06/04/2015   SUI (stress urinary incontinence, female) 2017   Thrombocytopenia ([place])     Past Surgical History:  Procedure Laterality Date    BACK SURGERY  11/2018   CATARACT EXTRACTION W/ INTRAOCULAR LENS  IMPLANT, BILATERAL Bilateral    COLONOSCOPY     KNEE ARTHROSCOPY Left 2004, 1990s   TONSILLECTOMY AND ADENOIDECTOMY  1952   UPPER GASTROINTESTINAL ENDOSCOPY      Allergies  Allergen Reactions   Keflex [Cephalexin]     rash    Outpatient Encounter Medications as of 06/23/2022  Medication Sig   acetaminophen (TYLENOL) 500 MG tablet Take 500 mg by mouth every 6 (six) hours as needed.   ALPRAZolam (XANAX) 0.5 MG tablet Take 1 tablet (0.5 mg total) by mouth 2 (two) times daily.   atorvastatin (LIPITOR) 20 MG tablet Take 1 tablet (20 mg total) by mouth daily.   buPROPion (WELLBUTRIN XL) 150 MG 24 hr tablet Take 150 mg by mouth daily.   COVID-19 mRNA vaccine 2023-2024 (COMIRNATY) syringe Inject into the muscle.   dapagliflozin propanediol (FARXIGA) 5 MG TABS tablet Take 5 mg by mouth daily.   esomeprazole (NEXIUM) 20 MG capsule Take 1 capsule (20 mg total) by mouth daily at 12 noon.   glucose blood (ONETOUCH VERIO) test strip Use to test blood sugar daily. Dx: E11.9   ketoconazole (NIZORAL) 2 % cream Apply 1 application topically 2 (two) times daily as needed (  groin rash).   LORazepam (ATIVAN) 0.5 MG tablet Take 1 tablet (0.5 mg total) by mouth 3 (three) times daily.   losartan (COZAAR) 100 MG tablet TAKE 1 TABLET BY MOUTH ONCE DAILY   metFORMIN (GLUCOPHAGE) 1000 MG tablet Take 1 tablet (1,000 mg total) by mouth 2 (two) times daily with a meal.   nortriptyline (PAMELOR) 25 MG capsule Take 50 mg by mouth at bedtime.   RSV vaccine recomb adjuvanted (AREXVY) 120 MCG/0.5ML injection Inject into the muscle.   No facility-administered encounter medications on file as of 06/23/2022.    Review of Systems:  Review of Systems  Constitutional:  Negative for activity change and appetite change.  HENT: Negative.    Respiratory:  Negative for cough and shortness of breath.   Cardiovascular:  Negative for leg swelling.   Gastrointestinal:  Negative for constipation.  Genitourinary: Negative.   Musculoskeletal:  Negative for arthralgias, gait problem and myalgias.  Skin: Negative.   Neurological:  Negative for dizziness and weakness.  Psychiatric/Behavioral:  Positive for confusion and dysphoric mood. Negative for sleep disturbance. The patient is nervous/anxious.     Health Maintenance  Topic Date Due   Diabetic kidney evaluation - Urine ACR  09/14/2014   Medicare Annual Wellness (AWV)  06/17/2021   COVID-19 Vaccine (7 - 2023-24 season) 03/17/2022   FOOT EXAM  05/21/2022   Pneumonia Vaccine 76+ Years old (77 of 3 - PPSV23 or PCV20) 09/07/2022 (Originally 12/21/2018)   HEMOGLOBIN A1C  09/11/2022   Diabetic kidney evaluation - eGFR measurement  12/12/2022   OPHTHALMOLOGY EXAM  02/04/2023   DTaP/Tdap/Td (3 - Td or Tdap) 11/29/2029   INFLUENZA VACCINE  Completed   DEXA SCAN  Completed   Hepatitis C Screening  Completed   Zoster Vaccines- Shingrix  Completed   HPV VACCINES  Aged Out   COLONOSCOPY (Pts 45-72yrs Insurance coverage will need to be confirmed)  Discontinued    Physical Exam: Vitals:   06/23/22 1530  BP: 128/78  Pulse: (!) 110  Resp: 17  Temp: 97.9 F (36.6 C)  TempSrc: Temporal  SpO2: 99%  Height: 5' (1.524 m)   Body mass index is 23.51 kg/m. Physical Exam Vitals reviewed.  Constitutional:      Appearance: Normal appearance.  HENT:     Head: Normocephalic.     Nose: Nose normal.     Mouth/Throat:     Mouth: Mucous membranes are moist.     Pharynx: Oropharynx is clear.  Eyes:     Pupils: Pupils are equal, round, and reactive to light.  Cardiovascular:     Rate and Rhythm: Normal rate and regular rhythm.     Pulses: Normal pulses.     Heart sounds: Normal heart sounds. No murmur heard. Pulmonary:     Effort: Pulmonary effort is normal.     Breath sounds: Normal breath sounds.  Abdominal:     General: Abdomen is flat. Bowel sounds are normal.     Palpations: Abdomen  is soft.  Musculoskeletal:        General: No swelling.     Cervical back: Neck supple.  Skin:    General: Skin is warm.  Neurological:     General: No focal deficit present.     Mental Status: She is alert and oriented to person, place, and time.  Psychiatric:        Mood and Affect: Mood normal.        Thought Content: Thought content normal.  06/23/2022    3:33 PM 09/21/2017    8:45 AM 05/20/2016    2:48 PM  MMSE - Mini Mental State Exam  Orientation to time 5 5 5   Orientation to Place 5 5 5   Registration 3 3 3   Attention/ Calculation 5 5 5   Recall 2 3 3   Language- name 2 objects 2 2 2   Language- repeat 1 1 1   Language- follow 3 step command 3 3 3   Language- read & follow direction 1 1 1   Write a sentence 1 1 1   Copy design 1 1 1   Total score 29 30 30      Labs reviewed: Basic Metabolic Panel: Recent Labs    08/28/21 0000 12/11/21 0000  NA 143 142  K 4.8 4.7  CL 108 108  CO2 23* 21  BUN 17 20  CREATININE 0.8 0.9  CALCIUM 9.2 9.2   Liver Function Tests: Recent Labs    08/28/21 0000  AST 15  ALT 15  ALKPHOS 83  ALBUMIN 4.4   No results for input(s): "LIPASE", "AMYLASE" in the last 8760 hours. No results for input(s): "AMMONIA" in the last 8760 hours. CBC: Recent Labs    08/28/21 0000 03/12/22 0000  WBC 6.2 6.1  HGB 12.6 12.7  HCT 38 39  PLT 409* 414*   Lipid Panel: Recent Labs    08/28/21 0000  CHOL 139  HDL 62  LDLCALC 52  TRIG 126   Lab Results  Component Value Date   HGBA1C 7.3 12/11/2021    Procedures since last visit: No results found.  Assessment/Plan 1. Mild cognitive impairment MMSE as above  - Ambulatory referral to Neurology  2. Anxiety On xanax and Ativan per Dr Casimiro Needle Failed GDR  3. Type 2 diabetes mellitus without complication, without long-term current use of insulin (HCC) On Metformin  4. Hyperlipidemia associated with type 2 diabetes mellitus ([place]) statin    Labs/tests ordered:   Next appt:   07/28/2022

## 2022-06-29 DIAGNOSIS — M19071 Primary osteoarthritis, right ankle and foot: Secondary | ICD-10-CM | POA: Diagnosis not present

## 2022-06-29 DIAGNOSIS — M79671 Pain in right foot: Secondary | ICD-10-CM | POA: Diagnosis not present

## 2022-06-29 DIAGNOSIS — Z6820 Body mass index (BMI) 20.0-20.9, adult: Secondary | ICD-10-CM | POA: Diagnosis not present

## 2022-06-30 ENCOUNTER — Other Ambulatory Visit: Payer: Self-pay | Admitting: Orthopedic Surgery

## 2022-06-30 DIAGNOSIS — F419 Anxiety disorder, unspecified: Secondary | ICD-10-CM

## 2022-06-30 MED ORDER — LORAZEPAM 0.5 MG PO TABS: 0.5000 mg | ORAL_TABLET | Freq: Three times a day (TID) | ORAL | 1 refills | Status: DC

## 2022-06-30 MED ORDER — ALPRAZOLAM 0.5 MG PO TABS: 0.5000 mg | ORAL_TABLET | Freq: Two times a day (BID) | ORAL | 0 refills | Status: DC

## 2022-07-21 DIAGNOSIS — E1065 Type 1 diabetes mellitus with hyperglycemia: Secondary | ICD-10-CM | POA: Diagnosis not present

## 2022-07-21 LAB — BASIC METABOLIC PANEL
BUN: 23 — AB (ref 4–21)
CO2: 20 (ref 13–22)
Chloride: 106 (ref 99–108)
Creatinine: 0.9 (ref 0.5–1.1)
Glucose: 137
Potassium: 4.4 mEq/L (ref 3.5–5.1)
Sodium: 140 (ref 137–147)

## 2022-07-21 LAB — HEMOGLOBIN A1C: Hemoglobin A1C: 8

## 2022-07-21 LAB — MICROALBUMIN, URINE: Microalb, Ur: <1.2

## 2022-07-21 LAB — COMPREHENSIVE METABOLIC PANEL: Calcium: 9.5 (ref 8.7–10.7)

## 2022-07-28 ENCOUNTER — Encounter: Payer: Self-pay | Admitting: Internal Medicine

## 2022-07-28 ENCOUNTER — Non-Acute Institutional Stay: Payer: Medicare Other | Admitting: Internal Medicine

## 2022-07-28 VITALS — BP 114/72 | HR 105 | Temp 97.6°F | Resp 17 | Ht 60.0 in | Wt 120.0 lb

## 2022-07-28 DIAGNOSIS — E119 Type 2 diabetes mellitus without complications: Secondary | ICD-10-CM

## 2022-07-28 DIAGNOSIS — R4189 Other symptoms and signs involving cognitive functions and awareness: Secondary | ICD-10-CM | POA: Insufficient documentation

## 2022-07-28 DIAGNOSIS — F411 Generalized anxiety disorder: Secondary | ICD-10-CM

## 2022-07-28 DIAGNOSIS — E2839 Other primary ovarian failure: Secondary | ICD-10-CM | POA: Diagnosis not present

## 2022-07-28 DIAGNOSIS — G3184 Mild cognitive impairment, so stated: Secondary | ICD-10-CM

## 2022-07-28 DIAGNOSIS — B351 Tinea unguium: Secondary | ICD-10-CM | POA: Diagnosis not present

## 2022-07-28 DIAGNOSIS — E1169 Type 2 diabetes mellitus with other specified complication: Secondary | ICD-10-CM

## 2022-07-28 DIAGNOSIS — K219 Gastro-esophageal reflux disease without esophagitis: Secondary | ICD-10-CM

## 2022-07-28 DIAGNOSIS — B372 Candidiasis of skin and nail: Secondary | ICD-10-CM

## 2022-07-28 DIAGNOSIS — F322 Major depressive disorder, single episode, severe without psychotic features: Secondary | ICD-10-CM

## 2022-07-28 DIAGNOSIS — E785 Hyperlipidemia, unspecified: Secondary | ICD-10-CM

## 2022-07-28 MED ORDER — KETOCONAZOLE 2 % EX CREA: 1.0000 | TOPICAL_CREAM | Freq: Two times a day (BID) | CUTANEOUS | 1 refills | Status: DC | PRN

## 2022-07-28 NOTE — Progress Notes (Signed)
Location:  [facility] features editor of Service:  Clinic (12)  Provider:   Code Status:  Goals of Care:     07/28/2022   12:47 PM  Advanced Directives  Does Patient Have a Medical Advance Directive? Yes  Type of Estate agent of [place];Living will;Out of facility DNR (pink MOST or yellow form)  Does patient want to make changes to medical advance directive? No - Patient declined     Chief Complaint  Patient presents with   Medical Management of Chronic Issues    Patient is being seen for 4 month follow up to discuss labs and her feet problems   Health Maintenance    Patient is due for foot exam , Diabetic kidney evaluation, and AWV patient wants to decline at this time    HPI: Patient is a 77 y.o. female seen today for medical management of chronic diseases.    Lives in [place]  Type 2 diabetes A1c elevated  Patient states that she has been having anxiety and has been eating back jellybeans instead of eating normal food. To try diet modification Fungal infection in the toe after having pedicure Depression and anxiety continues to be her issue.  Sees Dr. Jasmine Awe and in-house psychologist  Dysphagia improved since EGD and dilatation Wt Readings from Last 3 Encounters:  07/28/22 120 lb (54.4 kg)  03/23/22 120 lb 6.4 oz (54.6 kg)  01/05/22 121 lb (54.9 kg)    Cognitive impairment per her family Plans to see neurology in June Does not drive anymore Past Medical History:  Diagnosis Date   Allergic rhinitis    Allergy    Anemia    as child    Anxiety    Arthritis    Atrophic vaginitis 2017   Cancer (HCC)    MOHS nose- skin cancer- basal cell    Cataract    removed both eyes    Depression    Diabetes mellitus without complication (HCC)    GERD (gastroesophageal reflux disease)    Heart murmur    as child- outgrown    Hematuria 05/09/2015   had two nonobstructing renal calculi   Hepatitis A 1970s   Hyperlipidemia     Hypertension    Osteopenia    Osteoporosis    PONV (postoperative nausea and vomiting)    Shingles 06/04/2015   SUI (stress urinary incontinence, female) 2017   Thrombocytopenia (HCC)     Past Surgical History:  Procedure Laterality Date   BACK SURGERY  11/2018   CATARACT EXTRACTION W/ INTRAOCULAR LENS  IMPLANT, BILATERAL Bilateral    COLONOSCOPY     KNEE ARTHROSCOPY Left 2004, 1990s   TONSILLECTOMY AND ADENOIDECTOMY  1952   UPPER GASTROINTESTINAL ENDOSCOPY      Allergies  Allergen Reactions   Keflex [Cephalexin]     rash    Outpatient Encounter Medications as of 07/28/2022  Medication Sig   acetaminophen (TYLENOL) 500 MG tablet Take 500 mg by mouth every 6 (six) hours as needed.   ALPRAZolam (XANAX) 0.5 MG tablet Take 1 tablet (0.5 mg total) by mouth 2 (two) times daily.   atorvastatin (LIPITOR) 20 MG tablet Take 1 tablet (20 mg total) by mouth daily.   buPROPion (WELLBUTRIN XL) 150 MG 24 hr tablet Take 150 mg by mouth daily.   COVID-19 mRNA vaccine 2023-2024 (COMIRNATY) syringe Inject into the muscle.   dapagliflozin propanediol (FARXIGA) 5 MG TABS tablet Take 5 mg by mouth daily.   esomeprazole (NEXIUM)  20 MG capsule Take 1 capsule (20 mg total) by mouth daily at 12 noon.   glucose blood (ONETOUCH VERIO) test strip Use to test blood sugar daily. Dx: E11.9   LORazepam (ATIVAN) 0.5 MG tablet Take 1 tablet (0.5 mg total) by mouth 3 (three) times daily.   losartan (COZAAR) 100 MG tablet TAKE 1 TABLET BY MOUTH ONCE DAILY   metFORMIN (GLUCOPHAGE) 1000 MG tablet Take 1 tablet (1,000 mg total) by mouth 2 (two) times daily with a meal.   nortriptyline (PAMELOR) 25 MG capsule Take 50 mg by mouth at bedtime.   RSV vaccine recomb adjuvanted (AREXVY) 120 MCG/0.5ML injection Inject into the muscle.   [DISCONTINUED] ketoconazole (NIZORAL) 2 % cream Apply 1 application topically 2 (two) times daily as needed (groin rash).   ketoconazole (NIZORAL) 2 % cream Apply 1 Application topically 2  (two) times daily as needed (groin rash).   No facility-administered encounter medications on file as of 07/28/2022.    Review of Systems:  Review of Systems  Constitutional:  Negative for activity change and appetite change.  HENT: Negative.    Respiratory:  Negative for cough and shortness of breath.   Cardiovascular:  Negative for leg swelling.  Gastrointestinal:  Negative for constipation.  Genitourinary: Negative.   Musculoskeletal:  Negative for arthralgias, gait problem and myalgias.  Skin: Negative.   Neurological:  Negative for dizziness and weakness.  Psychiatric/Behavioral:  Positive for dysphoric mood. Negative for confusion and sleep disturbance. The patient is nervous/anxious.     Health Maintenance  Topic Date Due   Diabetic kidney evaluation - Urine ACR  09/14/2014   Medicare Annual Wellness (AWV)  06/17/2021   COVID-19 Vaccine (7 - 2023-24 season) 08/12/2022 (Originally 03/17/2022)   Pneumonia Vaccine 67+ Years old (3 of 3 - PPSV23 or PCV20) 09/07/2022 (Originally 12/21/2018)   INFLUENZA VACCINE  11/05/2022   HEMOGLOBIN A1C  01/20/2023   OPHTHALMOLOGY EXAM  02/04/2023   Diabetic kidney evaluation - eGFR measurement  07/21/2023   FOOT EXAM  07/28/2023   DTaP/Tdap/Td (3 - Td or Tdap) 11/29/2029   DEXA SCAN  Completed   Hepatitis C Screening  Completed   Zoster Vaccines- Shingrix  Completed   HPV VACCINES  Aged Out   COLONOSCOPY (Pts 45-40yrs Insurance coverage will need to be confirmed)  Discontinued    Physical Exam: Vitals:   07/28/22 1244  BP: 114/72  Pulse: (!) 105  Resp: 17  Temp: 97.6 F (36.4 C)  TempSrc: Temporal  SpO2: 97%  Weight: 120 lb (54.4 kg)  Height: 5' (1.524 m)   Body mass index is 23.44 kg/m. Physical Exam Vitals reviewed.  Constitutional:      Appearance: Normal appearance.  HENT:     Head: Normocephalic.     Nose: Nose normal.     Mouth/Throat:     Mouth: Mucous membranes are moist.     Pharynx: Oropharynx is clear.   Eyes:     Pupils: Pupils are equal, round, and reactive to light.  Cardiovascular:     Rate and Rhythm: Normal rate and regular rhythm.     Pulses: Normal pulses.     Heart sounds: Normal heart sounds. No murmur heard. Pulmonary:     Effort: Pulmonary effort is normal.     Breath sounds: Normal breath sounds.  Abdominal:     General: Abdomen is flat. Bowel sounds are normal.     Palpations: Abdomen is soft.  Musculoskeletal:        General: No  swelling.     Cervical back: Neck supple.     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  Neurological:     General: No focal deficit present.     Mental Status: She is alert and oriented to person, place, and time.  Psychiatric:        Mood and Affect: Mood normal.        Thought Content: Thought content normal.     Labs reviewed: Basic Metabolic Panel: Recent Labs    08/28/21 0000 12/11/21 0000 07/21/22 0000  NA 143 142 140  K 4.8 4.7 4.4  CL 108 108 106  CO2 23* 21 20  BUN 17 20 23*  CREATININE 0.8 0.9 0.9  CALCIUM 9.2 9.2 9.5   Liver Function Tests: Recent Labs    08/28/21 0000  AST 15  ALT 15  ALKPHOS 83  ALBUMIN 4.4   No results for input(s): "LIPASE", "AMYLASE" in the last 8760 hours. No results for input(s): "AMMONIA" in the last 8760 hours. CBC: Recent Labs    08/28/21 0000 03/12/22 0000  WBC 6.2 6.1  HGB 12.6 12.7  HCT 38 39  PLT 409* 414*   Lipid Panel: Recent Labs    08/28/21 0000  CHOL 139  HDL 62  LDLCALC 52  TRIG 126   Lab Results  Component Value Date   HGBA1C 8.0 07/21/2022    Procedures since last visit: No results found.  Assessment/Plan 1. Yeast dermatitis  - ketoconazole (NIZORAL) 2 % cream; Apply 1 Application topically 2 (two) times daily as needed (groin rash).  Dispense: 30 g; Refill: 1  2. Estrogen deficiency  - DG DXA FRACTURE ASSESSMENT;  Future  3. Type 2 diabetes mellitus without complication, without long-term current use of insulin A1C elevated at 8 Patient says has been eating jelly beans and has been upset recently  Continue Metformin and Farxiga Repeat next visit 4. Mild cognitive impairment Neurology   5. Hyperlipidemia associated with type 2 diabetes mellitus Statin  6. GAD (generalized anxiety disorder) On Ativan and Xanax No GDR still very anxious  7. Depression, major, single episode, severe Sees DR Plovsky On Wellbutrin,  8. Gastroesophageal reflux disease without esophagitis Nexium   9. Fungal infection of toenail Using OTC antifungal Lotion And new nail looks Good Has appointment with Podiatrist 10 HTN On Cozaar 11 Insomnia On Pamelor We discussed that she can try Half Dose and see if it helps As she was c/o Some dry mouth 12 Platelets count elevated Repeat CBC   Labs/tests ordered:  A1C and CBC Lipid before follow up Next appt:  Visit date not found

## 2022-08-03 ENCOUNTER — Ambulatory Visit (HOSPITAL_BASED_OUTPATIENT_CLINIC_OR_DEPARTMENT_OTHER)
Admission: RE | Admit: 2022-08-03 | Discharge: 2022-08-03 | Disposition: A | Discharge status: Home / Self Care | Payer: Medicare Other | Source: Ambulatory Visit | Attending: Internal Medicine | Admitting: Internal Medicine

## 2022-08-03 ENCOUNTER — Other Ambulatory Visit: Payer: Self-pay | Admitting: Internal Medicine

## 2022-08-03 DIAGNOSIS — E1169 Type 2 diabetes mellitus with other specified complication: Secondary | ICD-10-CM

## 2022-08-03 DIAGNOSIS — G3184 Mild cognitive impairment, so stated: Secondary | ICD-10-CM

## 2022-08-03 DIAGNOSIS — F411 Generalized anxiety disorder: Secondary | ICD-10-CM

## 2022-08-03 DIAGNOSIS — E119 Type 2 diabetes mellitus without complications: Secondary | ICD-10-CM

## 2022-08-03 DIAGNOSIS — B351 Tinea unguium: Secondary | ICD-10-CM

## 2022-08-03 DIAGNOSIS — M810 Age-related osteoporosis without current pathological fracture: Secondary | ICD-10-CM | POA: Diagnosis not present

## 2022-08-03 DIAGNOSIS — F322 Major depressive disorder, single episode, severe without psychotic features: Secondary | ICD-10-CM

## 2022-08-03 DIAGNOSIS — E2839 Other primary ovarian failure: Secondary | ICD-10-CM | POA: Insufficient documentation

## 2022-08-03 DIAGNOSIS — B372 Candidiasis of skin and nail: Secondary | ICD-10-CM

## 2022-08-03 DIAGNOSIS — K219 Gastro-esophageal reflux disease without esophagitis: Secondary | ICD-10-CM

## 2022-08-04 ENCOUNTER — Other Ambulatory Visit: Payer: Self-pay | Admitting: Orthopedic Surgery

## 2022-08-04 DIAGNOSIS — E1140 Type 2 diabetes mellitus with diabetic neuropathy, unspecified: Secondary | ICD-10-CM | POA: Diagnosis not present

## 2022-08-04 DIAGNOSIS — F419 Anxiety disorder, unspecified: Secondary | ICD-10-CM

## 2022-08-04 DIAGNOSIS — B351 Tinea unguium: Secondary | ICD-10-CM | POA: Diagnosis not present

## 2022-08-04 MED ORDER — LORAZEPAM 0.5 MG PO TABS
0.5000 mg | ORAL_TABLET | Freq: Three times a day (TID) | ORAL | 1 refills | Status: DC
Start: 2022-08-04 — End: 2022-10-13

## 2022-08-04 MED ORDER — ALPRAZOLAM 0.5 MG PO TABS
0.5000 mg | ORAL_TABLET | Freq: Two times a day (BID) | ORAL | 0 refills | Status: DC
Start: 2022-08-04 — End: 2022-09-01

## 2022-08-11 ENCOUNTER — Encounter: Payer: Self-pay | Admitting: Internal Medicine

## 2022-08-11 ENCOUNTER — Non-Acute Institutional Stay: Payer: Medicare Other | Admitting: Internal Medicine

## 2022-08-11 VITALS — BP 126/76 | HR 102 | Temp 97.6°F | Resp 17 | Ht 60.0 in | Wt 119.6 lb

## 2022-08-11 DIAGNOSIS — M810 Age-related osteoporosis without current pathological fracture: Secondary | ICD-10-CM

## 2022-08-11 DIAGNOSIS — F322 Major depressive disorder, single episode, severe without psychotic features: Secondary | ICD-10-CM | POA: Diagnosis not present

## 2022-08-11 DIAGNOSIS — E119 Type 2 diabetes mellitus without complications: Secondary | ICD-10-CM

## 2022-08-11 DIAGNOSIS — G3184 Mild cognitive impairment, so stated: Secondary | ICD-10-CM

## 2022-08-11 MED ORDER — ALENDRONATE SODIUM 70 MG PO TABS: 70.0000 mg | ORAL_TABLET | ORAL | 11 refills | Status: DC

## 2022-08-11 NOTE — Patient Instructions (Addendum)
Talk to your dentist before starting Fosamax. Take calcium and Vit D everyday Calcium 1200 mg in divided doses and Vit D 1000 IU Weight bearing exercise

## 2022-08-11 NOTE — Progress Notes (Signed)
Location: [facility] features editor of Service:  Clinic (12)  Provider:   Code Status:  Goals of Care:     08/11/2022    8:40 AM  Advanced Directives  Does Patient Have a Medical Advance Directive? Yes  Type of Estate agent of [place];Living will;Out of facility DNR (pink MOST or yellow form)  Does patient want to make changes to medical advance directive? No - Patient declined     Chief Complaint  Patient presents with   Acute Visit    Patient states she would would like to discuss some concerns   Quality Metric Gaps    Patient declined the AWV    HPI: Patient is a 77 y.o. female seen today for an acute visit to discuss her DEXA scan  Patient has h/o Hypertension, Type 2 Diabetes, and Cervical Radiculopathy and Dysphagia   She wanted to discuss her DEXA scan Right Femur is -2.5  She gets very anxious and is worried .  Other hisory  Depression and Anxiety Lost husband a year ago Since then struggling with depression Sees Plovsky and in house Psychologist   Dysphagia Much better since EGD and Dilatation Back on Nexium Symptoms of nausea resolved Mild Cognitive impairment Has Appointment with Neurology    Past Medical History:  Diagnosis Date   Allergic rhinitis    Allergy    Anemia    as child    Anxiety    Arthritis    Atrophic vaginitis 2017   Cancer (HCC)    MOHS nose- skin cancer- basal cell    Cataract    removed both eyes    Depression    Diabetes mellitus without complication (HCC)    GERD (gastroesophageal reflux disease)    Heart murmur    as child- outgrown    Hematuria 05/09/2015   had two nonobstructing renal calculi   Hepatitis A 1970s   Hyperlipidemia    Hypertension    Osteopenia    Osteoporosis    PONV (postoperative nausea and vomiting)    Shingles 06/04/2015   SUI (stress urinary incontinence, female) 2017   Thrombocytopenia (HCC)     Past Surgical History:  Procedure Laterality  Date   BACK SURGERY  11/2018   CATARACT EXTRACTION W/ INTRAOCULAR LENS  IMPLANT, BILATERAL Bilateral    COLONOSCOPY     KNEE ARTHROSCOPY Left 2004, 1990s   TONSILLECTOMY AND ADENOIDECTOMY  1952   UPPER GASTROINTESTINAL ENDOSCOPY      Allergies  Allergen Reactions   Keflex [Cephalexin]     rash    Outpatient Encounter Medications as of 08/11/2022  Medication Sig   acetaminophen (TYLENOL) 500 MG tablet Take 500 mg by mouth every 6 (six) hours as needed.   alendronate (FOSAMAX) 70 MG tablet Take 1 tablet (70 mg total) by mouth every 7 (seven) days. Take with a full glass of water on an empty stomach.   ALPRAZolam (XANAX) 0.5 MG tablet Take 1 tablet (0.5 mg total) by mouth 2 (two) times daily.   atorvastatin (LIPITOR) 20 MG tablet Take 1 tablet (20 mg total) by mouth daily.   buPROPion (WELLBUTRIN XL) 150 MG 24 hr tablet Take 150 mg by mouth daily.   dapagliflozin propanediol (FARXIGA) 5 MG TABS tablet Take 5 mg by mouth daily.   esomeprazole (NEXIUM) 20 MG capsule Take 1 capsule (20 mg total) by mouth daily at 12 noon.   glucose blood (ONETOUCH VERIO) test strip Use to test blood sugar daily.  Dx: E11.9   ketoconazole (NIZORAL) 2 % cream Apply 1 Application topically 2 (two) times daily as needed (groin rash).   LORazepam (ATIVAN) 0.5 MG tablet Take 1 tablet (0.5 mg total) by mouth 3 (three) times daily.   losartan (COZAAR) 100 MG tablet TAKE 1 TABLET BY MOUTH ONCE DAILY   metFORMIN (GLUCOPHAGE) 1000 MG tablet Take 1 tablet (1,000 mg total) by mouth 2 (two) times daily with a meal.   nortriptyline (PAMELOR) 25 MG capsule Take 50 mg by mouth at bedtime.   RSV vaccine recomb adjuvanted (AREXVY) 120 MCG/0.5ML injection Inject into the muscle.   No facility-administered encounter medications on file as of 08/11/2022.    Review of Systems:  Review of Systems  Constitutional:  Negative for activity change and appetite change.  HENT: Negative.    Respiratory:  Negative for cough and shortness  of breath.   Cardiovascular:  Negative for leg swelling.  Gastrointestinal:  Negative for constipation.  Genitourinary: Negative.   Musculoskeletal:  Negative for arthralgias, gait problem and myalgias.  Skin: Negative.   Neurological:  Negative for dizziness and weakness.  Psychiatric/Behavioral:  Negative for confusion, dysphoric mood and sleep disturbance. The patient is nervous/anxious.     Health Maintenance  Topic Date Due   Medicare Annual Wellness (AWV)  06/17/2021   Diabetic kidney evaluation - Urine ACR  08/12/2022 (Originally 09/14/2014)   COVID-19 Vaccine (7 - 2023-24 season) 08/12/2022 (Originally 03/17/2022)   Pneumonia Vaccine 50+ Years old (3 of 3 - PPSV23 or PCV20) 09/07/2022 (Originally 12/21/2018)   INFLUENZA VACCINE  11/05/2022   HEMOGLOBIN A1C  01/20/2023   OPHTHALMOLOGY EXAM  02/04/2023   Diabetic kidney evaluation - eGFR measurement  07/21/2023   FOOT EXAM  07/28/2023   DTaP/Tdap/Td (3 - Td or Tdap) 11/29/2029   DEXA SCAN  Completed   Hepatitis C Screening  Completed   Zoster Vaccines- Shingrix  Completed   HPV VACCINES  Aged Out   COLONOSCOPY (Pts 45-64yrs Insurance coverage will need to be confirmed)  Discontinued    Physical Exam: Vitals:   08/11/22 0837  BP: 126/76  Pulse: (!) 102  Resp: 17  Temp: 97.6 F (36.4 C)  TempSrc: Temporal  SpO2: 97%  Weight: 119 lb 9.6 oz (54.3 kg)  Height: 5' (1.524 m)   Body mass index is 23.36 kg/m. Physical Exam Vitals reviewed.  Constitutional:      Appearance: Normal appearance.  HENT:     Head: Normocephalic.     Nose: Nose normal.     Mouth/Throat:     Mouth: Mucous membranes are moist.     Pharynx: Oropharynx is clear.  Eyes:     Pupils: Pupils are equal, round, and reactive to light.  Cardiovascular:     Rate and Rhythm: Normal rate and regular rhythm.     Pulses: Normal pulses.     Heart sounds: Normal heart sounds. No murmur heard. Pulmonary:     Effort: Pulmonary effort is normal.      Breath sounds: Normal breath sounds.  Abdominal:     General: Abdomen is flat. Bowel sounds are normal.     Palpations: Abdomen is soft.  Musculoskeletal:        General: No swelling.     Cervical back: Neck supple.  Skin:    General: Skin is warm.  Neurological:     General: No focal deficit present.     Mental Status: She is alert and oriented to person, place, and time.  Psychiatric:  Mood and Affect: Mood normal.        Thought Content: Thought content normal.     Labs reviewed: Basic Metabolic Panel: Recent Labs    08/28/21 0000 12/11/21 0000 07/21/22 0000  NA 143 142 140  K 4.8 4.7 4.4  CL 108 108 106  CO2 23* 21 20  BUN 17 20 23*  CREATININE 0.8 0.9 0.9  CALCIUM 9.2 9.2 9.5   Liver Function Tests: Recent Labs    08/28/21 0000  AST 15  ALT 15  ALKPHOS 83  ALBUMIN 4.4   No results for input(s): "LIPASE", "AMYLASE" in the last 8760 hours. No results for input(s): "AMMONIA" in the last 8760 hours. CBC: Recent Labs    08/28/21 0000 03/12/22 0000  WBC 6.2 6.1  HGB 12.6 12.7  HCT 38 39  PLT 409* 414*   Lipid Panel: Recent Labs    08/28/21 0000  CHOL 139  HDL 62  LDLCALC 52  TRIG 126   Lab Results  Component Value Date   HGBA1C 8.0 07/21/2022    Procedures since last visit: DG BONE DENSITY (DXA)  Result Date: 08/03/2022 EXAM: DUAL X-RAY ABSORPTIOMETRY (DXA) FOR BONE MINERAL DENSITY IMPRESSION: Referring Physician:  Mahlon Gammon Your patient completed a bone mineral density test using GE Lunar iDXA system (analysis version: 16). Technologist: ALW PATIENT: Name: Alonie, Cloran Patient ID: [id number] Birth Date: 14-Dec-1945 Height: 60.0 in. Sex: Female Measured: 08/03/2022 Weight: 120.0 lbs. Indications: Advanced Age, Caucasian, Diabetic (non-insulin), Estrogen Deficiency, History of osteoporosis, Post Menopausal, Wellbutrin Fractures: Rib Treatments: ASSESSMENT: The BMD measured at DualFemur Neck Right is 0.687 g/cm2 with a T-score of -2.5.  This patient is considered osteoporotic according to World Health Organization [facility]) criteria. The scan quality is good. Lumbar spine excluded due to degenerative changes. Site Region Measured Date Measured Age YA BMD Significant CHANGE T-score DualFemur Neck Right 08/03/2022 78.7 -2.5 0.687 g/cm2 DualFemur Total Mean 08/03/2022 78.7 -1.6 0.810 g/cm2 Left Forearm Radius 33% 08/03/2022 78.7 -1.5 0.746 g/cm2 World Health Organization [facility]) criteria for post-menopausal, Caucasian Women: Normal       T-score at or above -1 SD Osteopenia   T-score between -1 and -2.5 SD Osteoporosis T-score at or below -2.5 SD RECOMMENDATION: 1. All patients should optimize calcium and vitamin D intake. 2. Consider FDA approved medical therapies in postmenopausal women and men aged 29 years and older, based on the following: a. A hip or vertebral (clinical or morphometric) fracture b. T-score = -2.5 at the femoral neck or spine after appropriate evaluation to exclude secondary causes c. Low bone mass (T-score between -1.0 and -2.5 at the femoral neck or spine) and a 10- year probability of a hip fracture = 3% or a 10 year probability of a major osteoporosis-related fracture = 20% based on the [place]-adapted WHO algorithm. 3. Clinician judgement and/or patient preference may indicate treatment for people with10-year fracture probabilities above or below these levels. FOLLOW-UP: Patients with diagnosis of osteoporosis or at high risk for fracture should have regular bone mineral density tests. For patients eligible for Medicare routine testing is allowed once every 2 years. The testing frequency can be increased to one year for patients who have rapidly progressing disease, those who are receiving or discontinuing medical therapy to restore bone mass, or have additional risk factors. I have reviewed this study and agree with the findings. Mark A. Tyron Russell, M.D. [facility] Electronically Signed   By: Ulyses Southward M.D.   On:  08/03/2022 09:50  Assessment/Plan 1. Age-related osteoporosis without current pathological fracture Right Femur -2.5 We discussed different options Fosamax would be easy for her but she has h/o Dysphagia She is willing to try Fosamax before making Commitment for Prolia in our office She does have some dental work coming up and I told her to start after you are done with your Dental work to start Fosamax  I have called in Prescription but do not start till your dental work done  2. Depression, major, single episode, severe (HCC) Continues to struggle with Anxiety On Ativan and Xanax No GDR still very anxious  3. Mild cognitive impairment Has appointment with Neurology  4. Type 2 diabetes mellitus without complication, without long-term current use of insulin (HCC) A1C elevated at 8 Patient says has been eating jelly beans and has been upset recently  Continue Metformin and Farxiga Repeat before  next visit 5 Hyperlipidemia associated with type 2 diabetes mellitus Statin 6Depression, major, single episode, severe Sees DR Plovsky On Wellbutrin, 7 . Gastroesophageal reflux disease without esophagitis Nexium    8. Fungal infection of toenail Using OTC antifungal Lotion And new nail looks Good  9 HTN On Cozaar 10 Insomnia On Pamelor We discussed that she can try Half Dose and see if it helps As she was c/o Some dry mouth 11 Platelets count elevated Repeat CBC     Labs/tests ordered:  A1C and CBC and Vit D Level before next visit Next appt:  11/09/2022

## 2022-08-12 ENCOUNTER — Telehealth: Payer: Self-pay

## 2022-08-12 NOTE — Telephone Encounter (Signed)
Patient called stating she spoke with someone from her insurance company and was told that her copay would be 100 dollars for the injection to treat osteoporosis and she thinks that will be best versus trying to follow the detailed instructions that go along with fosamax.   Patient states she plans to see the dentist on Monday and will follow-up with her final decision after seeing what the dentist thinks.

## 2022-08-24 ENCOUNTER — Non-Acute Institutional Stay: Payer: Medicare Other | Admitting: Adult Health

## 2022-08-24 ENCOUNTER — Encounter: Payer: Self-pay | Admitting: Adult Health

## 2022-08-24 VITALS — BP 128/84 | HR 113 | Temp 98.1°F | Resp 16 | Ht 60.0 in | Wt 119.0 lb

## 2022-08-24 DIAGNOSIS — M810 Age-related osteoporosis without current pathological fracture: Secondary | ICD-10-CM

## 2022-08-24 MED ORDER — PROLIA 60 MG/ML ~~LOC~~ SOSY: 60.0000 mg | PREFILLED_SYRINGE | SUBCUTANEOUS | 0 refills | Status: DC

## 2022-08-24 MED ORDER — CALCIUM CARBONATE 600 MG PO TABS: 600.0000 mg | ORAL_TABLET | Freq: Two times a day (BID) | ORAL | 0 refills | Status: AC

## 2022-08-24 MED ORDER — VITAMIN D3 25 MCG (1000 UT) PO CAPS: 1000.0000 [IU] | ORAL_CAPSULE | Freq: Every day | ORAL | 0 refills | Status: AC

## 2022-08-24 NOTE — Progress Notes (Signed)
Location:  [facility]  POS: Clinic  Provider: Fletcher Anon, ANP  Code Status:  Goals of Care:     08/24/2022    3:29 PM  Advanced Directives  Does Patient Have a Medical Advance Directive? Yes  Type of Estate agent of [place];Living will;Out of facility DNR (pink MOST or yellow form)  Does patient want to make changes to medical advance directive? No - Patient declined     Chief Complaint  Patient presents with   Acute Visit    Patient would like to discuss bone density and also about getting injections instead of medications     HPI: Patient is a 77 y.o. female seen today for discussion regarding treatment for osteoporosis.   Dexa scan 08/03/22 ASSESSMENT: The BMD measured at DualFemur Neck Right is 0.687 g/cm2 with a T-score of -2.5.  She originally agreed to take Fosamax but she has changed her mind and wants to take Prolia. She has checked with her insurance and it will be covered.   Past Medical History:  Diagnosis Date   Allergic rhinitis    Allergy    Anemia    as child    Anxiety    Arthritis    Atrophic vaginitis 2017   Cancer (HCC)    MOHS nose- skin cancer- basal cell    Cataract    removed both eyes    Depression    Diabetes mellitus without complication (HCC)    GERD (gastroesophageal reflux disease)    Heart murmur    as child- outgrown    Hematuria 05/09/2015   had two nonobstructing renal calculi   Hepatitis A 1970s   Hyperlipidemia    Hypertension    Osteopenia    Osteoporosis    PONV (postoperative nausea and vomiting)    Shingles 06/04/2015   SUI (stress urinary incontinence, female) 2017   Thrombocytopenia (HCC)     Past Surgical History:  Procedure Laterality Date   BACK SURGERY  11/2018   CATARACT EXTRACTION W/ INTRAOCULAR LENS  IMPLANT, BILATERAL Bilateral    COLONOSCOPY     KNEE ARTHROSCOPY Left 2004, 1990s   TONSILLECTOMY AND ADENOIDECTOMY  1952   UPPER GASTROINTESTINAL ENDOSCOPY       Allergies  Allergen Reactions   Keflex [Cephalexin]     rash    Outpatient Encounter Medications as of 08/24/2022  Medication Sig   acetaminophen (TYLENOL) 500 MG tablet Take 500 mg by mouth every 6 (six) hours as needed.   ALPRAZolam (XANAX) 0.5 MG tablet Take 1 tablet (0.5 mg total) by mouth 2 (two) times daily.   atorvastatin (LIPITOR) 20 MG tablet Take 1 tablet (20 mg total) by mouth daily.   buPROPion (WELLBUTRIN XL) 150 MG 24 hr tablet Take 150 mg by mouth daily.   calcium carbonate (CALCIUM 600) 600 MG TABS tablet Take 1 tablet (600 mg total) by mouth 2 (two) times daily with a meal.   Cholecalciferol (VITAMIN D3) 25 MCG (1000 UT) CAPS Take 1 capsule (1,000 Units total) by mouth daily.   dapagliflozin propanediol (FARXIGA) 5 MG TABS tablet Take 5 mg by mouth daily.   denosumab (PROLIA) 60 MG/ML SOSY injection Inject 60 mg into the skin every 6 (six) months.   esomeprazole (NEXIUM) 20 MG capsule Take 1 capsule (20 mg total) by mouth daily at 12 noon.   glucose blood (ONETOUCH VERIO) test strip Use to test blood sugar daily. Dx: E11.9   ketoconazole (NIZORAL) 2 % cream Apply 1 Application topically  2 (two) times daily as needed (groin rash).   LORazepam (ATIVAN) 0.5 MG tablet Take 1 tablet (0.5 mg total) by mouth 3 (three) times daily.   losartan (COZAAR) 100 MG tablet TAKE 1 TABLET BY MOUTH ONCE DAILY   metFORMIN (GLUCOPHAGE) 1000 MG tablet Take 1 tablet (1,000 mg total) by mouth 2 (two) times daily with a meal.   nortriptyline (PAMELOR) 25 MG capsule Take 50 mg by mouth at bedtime.   RSV vaccine recomb adjuvanted (AREXVY) 120 MCG/0.5ML injection Inject into the muscle.   [DISCONTINUED] alendronate (FOSAMAX) 70 MG tablet Take 1 tablet (70 mg total) by mouth every 7 (seven) days. Take with a full glass of water on an empty stomach.   No facility-administered encounter medications on file as of 08/24/2022.    Review of Systems:  Review of Systems  Constitutional:  Negative for  activity change, appetite change, chills, diaphoresis, fatigue, fever and unexpected weight change.  HENT:  Negative for congestion.   Respiratory:  Negative for cough, shortness of breath and wheezing.   Cardiovascular:  Negative for chest pain, palpitations and leg swelling.  Gastrointestinal:  Negative for abdominal distention, abdominal pain, constipation and diarrhea.  Genitourinary:  Negative for difficulty urinating and dysuria.  Musculoskeletal:  Negative for arthralgias, back pain, gait problem, joint swelling and myalgias.  Neurological:  Negative for dizziness, tremors, seizures, syncope, facial asymmetry, speech difficulty, weakness, light-headedness, numbness and headaches.  Psychiatric/Behavioral:  Negative for agitation, behavioral problems and confusion. The patient is nervous/anxious.     Health Maintenance  Topic Date Due   Diabetic kidney evaluation - Urine ACR  09/14/2014   Pneumonia Vaccine 28+ Years old (3 of 3 - PPSV23 or PCV20) 09/07/2022 (Originally 12/21/2018)   COVID-19 Vaccine (7 - 2023-24 season) 09/09/2022 (Originally 03/17/2022)   Medicare Annual Wellness (AWV)  09/24/2022 (Originally 06/17/2021)   INFLUENZA VACCINE  11/05/2022   HEMOGLOBIN A1C  01/20/2023   OPHTHALMOLOGY EXAM  02/04/2023   Diabetic kidney evaluation - eGFR measurement  07/21/2023   FOOT EXAM  07/28/2023   DTaP/Tdap/Td (3 - Td or Tdap) 11/29/2029   DEXA SCAN  Completed   Hepatitis C Screening  Completed   Zoster Vaccines- Shingrix  Completed   HPV VACCINES  Aged Out   COLONOSCOPY (Pts 45-15yrs Insurance coverage will need to be confirmed)  Discontinued    Physical Exam: Vitals:   08/24/22 1526  BP: 128/84  Pulse: (!) 113  Resp: 16  Temp: 98.1 F (36.7 C)  TempSrc: Temporal  SpO2: 97%  Weight: 119 lb (54 kg)  Height: 5' (1.524 m)   Body mass index is 23.24 kg/m. Physical Exam Vitals and nursing note reviewed.  Constitutional:      General: She is not in acute distress.     Appearance: She is not diaphoretic.  HENT:     Head: Normocephalic and atraumatic.  Neck:     Vascular: No JVD.  Cardiovascular:     Rate and Rhythm: Normal rate and regular rhythm.     Heart sounds: No murmur heard. Pulmonary:     Effort: Pulmonary effort is normal. No respiratory distress.     Breath sounds: Normal breath sounds. No wheezing.  Skin:    General: Skin is warm and dry.  Neurological:     Mental Status: She is alert and oriented to person, place, and time.     Labs reviewed: Basic Metabolic Panel: Recent Labs    08/28/21 0000 12/11/21 0000 07/21/22 0000  NA 143 142  140  K 4.8 4.7 4.4  CL 108 108 106  CO2 23* 21 20  BUN 17 20 23*  CREATININE 0.8 0.9 0.9  CALCIUM 9.2 9.2 9.5   Liver Function Tests: Recent Labs    08/28/21 0000  AST 15  ALT 15  ALKPHOS 83  ALBUMIN 4.4   No results for input(s): "LIPASE", "AMYLASE" in the last 8760 hours. No results for input(s): "AMMONIA" in the last 8760 hours. CBC: Recent Labs    08/28/21 0000 03/12/22 0000  WBC 6.2 6.1  HGB 12.6 12.7  HCT 38 39  PLT 409* 414*   Lipid Panel: Recent Labs    08/28/21 0000  CHOL 139  HDL 62  LDLCALC 52  TRIG 126   Lab Results  Component Value Date   HGBA1C 8.0 07/21/2022    Procedures since last visit: DG BONE DENSITY (DXA)  Result Date: 08/03/2022 EXAM: DUAL X-RAY ABSORPTIOMETRY (DXA) FOR BONE MINERAL DENSITY IMPRESSION: Referring Physician:  Mahlon Gammon Your patient completed a bone mineral density test using GE Lunar iDXA system (analysis version: 16). Technologist: ALW PATIENT: Name: Lynnley, Maggiacomo Patient ID: [id number] Birth Date: [birth year] Height: 60.0 in. Sex: Female Measured: 08/03/2022 Weight: 120.0 lbs. Indications: Advanced Age, Caucasian, Diabetic (non-insulin), Estrogen Deficiency, History of osteoporosis, Post Menopausal, Wellbutrin Fractures: Rib Treatments: ASSESSMENT: The BMD measured at DualFemur Neck Right is 0.687 g/cm2 with a T-score of -2.5.  This patient is considered osteoporotic according to World Health Organization [facility]) criteria. The scan quality is good. Lumbar spine excluded due to degenerative changes. Site Region Measured Date Measured Age YA BMD Significant CHANGE T-score DualFemur Neck Right 08/03/2022 78.7 -2.5 0.687 g/cm2 DualFemur Total Mean 08/03/2022 78.7 -1.6 0.810 g/cm2 Left Forearm Radius 33% 08/03/2022 78.7 -1.5 0.746 g/cm2 World Health Organization [facility]) criteria for post-menopausal, Caucasian Women: Normal       T-score at or above -1 SD Osteopenia   T-score between -1 and -2.5 SD Osteoporosis T-score at or below -2.5 SD RECOMMENDATION: 1. All patients should optimize calcium and vitamin D intake. 2. Consider FDA approved medical therapies in postmenopausal women and men aged 69 years and older, based on the following: a. A hip or vertebral (clinical or morphometric) fracture b. T-score = -2.5 at the femoral neck or spine after appropriate evaluation to exclude secondary causes c. Low bone mass (T-score between -1.0 and -2.5 at the femoral neck or spine) and a 10- year probability of a hip fracture = 3% or a 10 year probability of a major osteoporosis-related fracture = 20% based on the [place]-adapted WHO algorithm. 3. Clinician judgement and/or patient preference may indicate treatment for people with10-year fracture probabilities above or below these levels. FOLLOW-UP: Patients with diagnosis of osteoporosis or at high risk for fracture should have regular bone mineral density tests. For patients eligible for Medicare routine testing is allowed once every 2 years. The testing frequency can be increased to one year for patients who have rapidly progressing disease, those who are receiving or discontinuing medical therapy to restore bone mass, or have additional risk factors. I have reviewed this study and agree with the findings. Mark A. Tyron Russell, M.D. [facility] Electronically Signed   By: Ulyses Southward M.D.   On:  08/03/2022 09:50    Assessment/Plan  1. Age-related osteoporosis without current pathological fracture Reviewed BMP Begin Prolia, she decided she did not want to take Fosamax Discussed that this is a life long injection and will need lab monitoring To be given  at [facility] Needs BMP and Vit D level done 2 weeks after first injection  She will begin taking Calcium 600 mg bid and Vit D 1000 units qd. She does not want me to send a prescription to [facility] pharmacy she will buy over the counter.   Labs/tests ordered:  * No order type specified * BMP Vit D  Next appt:  11/09/2022   Total time :  time greater than 50% of total time spent doing pt counseling and coordination of care

## 2022-08-27 NOTE — Telephone Encounter (Signed)
Submitted Verification to [employer].  Awaiting Benefits and patient's decision.

## 2022-09-01 ENCOUNTER — Telehealth: Payer: Self-pay | Admitting: *Deleted

## 2022-09-01 ENCOUNTER — Other Ambulatory Visit: Payer: Self-pay | Admitting: Internal Medicine

## 2022-09-01 DIAGNOSIS — F419 Anxiety disorder, unspecified: Secondary | ICD-10-CM

## 2022-09-01 MED ORDER — ALPRAZOLAM 0.5 MG PO TABS
0.5000 mg | ORAL_TABLET | Freq: Two times a day (BID) | ORAL | 0 refills | Status: DC
Start: 2022-09-01 — End: 2022-09-29

## 2022-09-01 NOTE — Telephone Encounter (Signed)
Printed Summary of Benefits from [employer].  PA Required.   Submitted Prior Authorization Through [facility] and Authorization went into Determination.   Request is Pending Auth #: [id number]  Awaiting Determination.

## 2022-09-01 NOTE — Telephone Encounter (Signed)
Patient called requesting her Prolia injection.    I informed patient and Nurse Autumn at [facility] that I have submitted it through [employer] and waiting for the Summary of Benefits.  Patient does have [facility] Medicare so a Prior Authorization would need to be done through them also.   Patient aware that once this process is completed she would be notified. Agreed.

## 2022-09-02 ENCOUNTER — Telehealth: Payer: Self-pay

## 2022-09-02 ENCOUNTER — Ambulatory Visit: Payer: Medicare Other

## 2022-09-02 DIAGNOSIS — M810 Age-related osteoporosis without current pathological fracture: Secondary | ICD-10-CM

## 2022-09-02 DIAGNOSIS — M1711 Unilateral primary osteoarthritis, right knee: Secondary | ICD-10-CM | POA: Diagnosis not present

## 2022-09-02 NOTE — Telephone Encounter (Signed)
Mardella Layman from [facility] called about prior authorization for patient and specifically asked for Anita/CMA. She states that she has some questions. Message routed to medical assistant.

## 2022-09-03 NOTE — Telephone Encounter (Signed)
Then she would need to do the IV step therapy before they will approve the Prolia.   The Rep stated that it would have to be a 2 step process.

## 2022-09-03 NOTE — Telephone Encounter (Signed)
Called and spoke with Kimberly [name] with [facility] regarding Prior Authorization for Kimberly [name].   Kimberly [name] stated that patient has to meet the Step Therapy first before Prolia is Approved. Stated that patient has to try and Fail Fosamax first and then a IV therapy such as Reclast or Boniva and then they would consider Prolia therapy after trying these two steps first.   Please Advise.

## 2022-09-03 NOTE — Telephone Encounter (Signed)
Called and spoke with Mardella Layman with [facility] regarding Prior Authorization for [employer].    Mardella Layman stated that patient has to meet the Step Therapy first before Prolia is Approved. Stated that patient has to try and Fail Fosamax first and then a IV therapy such as Reclast or Boniva and then they would consider Prolia therapy after trying these two steps first.    (Message sent to Peggye Ley, NP to advise. (See telephone message dated 09/02/22)

## 2022-09-03 NOTE — Telephone Encounter (Signed)
Would it help if we communicate that Kimberly [name] has some esophageal issue and Fosamax would not be the safest choice for her due to possible side effects?  Thanks  Peggye Ley NP

## 2022-09-07 NOTE — Telephone Encounter (Signed)
I discussed with the patient and going to make referral to Rheumatology for IV Reclast and she is agreeable to that.

## 2022-09-15 ENCOUNTER — Non-Acute Institutional Stay: Payer: Medicare Other | Admitting: Internal Medicine

## 2022-09-15 ENCOUNTER — Encounter: Payer: Self-pay | Admitting: Internal Medicine

## 2022-09-15 VITALS — BP 116/72 | HR 97 | Temp 98.1°F | Resp 17 | Ht 60.0 in | Wt 119.4 lb

## 2022-09-15 DIAGNOSIS — M810 Age-related osteoporosis without current pathological fracture: Secondary | ICD-10-CM | POA: Diagnosis not present

## 2022-09-15 DIAGNOSIS — F419 Anxiety disorder, unspecified: Secondary | ICD-10-CM

## 2022-09-15 DIAGNOSIS — L989 Disorder of the skin and subcutaneous tissue, unspecified: Secondary | ICD-10-CM | POA: Diagnosis not present

## 2022-09-15 DIAGNOSIS — F322 Major depressive disorder, single episode, severe without psychotic features: Secondary | ICD-10-CM | POA: Diagnosis not present

## 2022-09-15 DIAGNOSIS — G3184 Mild cognitive impairment, so stated: Secondary | ICD-10-CM

## 2022-09-15 NOTE — Patient Instructions (Signed)
Will start Fosamax on Wed Put Vaseline with Hydrocortisone on face lesion Wait for the knee injections Will try to decrease the dose of Ativan to twice a day

## 2022-09-15 NOTE — Progress Notes (Signed)
Location: [facility] features editor of Service:  Clinic (12)  Provider:   Code Status:  Goals of Care:     09/15/2022    8:22 AM  Advanced Directives  Does Patient Have a Medical Advance Directive? Yes  Type of Estate agent of [place];Living will;Out of facility DNR (pink MOST or yellow form)  Does patient want to make changes to medical advance directive? No - Patient declined     Chief Complaint  Patient presents with   Acute Visit    Patient would like to know what to do for her bones   Health Maintenance    Patient is due for microalbumin , hep c screening and pneumonia vaccine    HPI: Patient is a 77 y.o. female seen today for an acute visit for her osteoporosis [place] in [place]  Recent diagnosis of Osteoporosis Right Femur is -2.5  Prolia not approved in spite of her h/o Dysphagia Insurance wants her to try Fosamax She is willing to try it now Does not want Reclast as has no Transport and gets nevous about IV infusions  Wants to discuss Hyaluronic acid Knee injections with PMNR group She gets steroid injection sin her knee twice /year Not having any pain right now  Had small place below her Right eye  Wants to know if I can reduce her ativan as she feels foggy and tired all the time Other issues Depression and Anxiety Lost husband a year ago Since then struggling with depression Sees [facility] and in house Psychologist   Dysphagia Much better since EGD and Dilatation Back on Nexium Symptoms of nausea resolved Mild Cognitive impairment Has Appointment with Neurology Past Medical History:  Diagnosis Date   Allergic rhinitis    Allergy    Anemia    as child    Anxiety    Arthritis    Atrophic vaginitis 2017   Cancer (HCC)    MOHS nose- skin cancer- basal cell    Cataract    removed both eyes    Depression    Diabetes mellitus without complication (HCC)    GERD (gastroesophageal reflux disease)    Heart  murmur    as child- outgrown    Hematuria 05/09/2015   had two nonobstructing renal calculi   Hepatitis A 1970s   Hyperlipidemia    Hypertension    Osteopenia    Osteoporosis    PONV (postoperative nausea and vomiting)    Shingles 06/04/2015   SUI (stress urinary incontinence, female) 2017   Thrombocytopenia (HCC)     Past Surgical History:  Procedure Laterality Date   BACK SURGERY  11/2018   CATARACT EXTRACTION W/ INTRAOCULAR LENS  IMPLANT, BILATERAL Bilateral    COLONOSCOPY     KNEE ARTHROSCOPY Left 2004, 1990s   TONSILLECTOMY AND ADENOIDECTOMY  1952   UPPER GASTROINTESTINAL ENDOSCOPY      Allergies  Allergen Reactions   Keflex [Cephalexin]     rash    Outpatient Encounter Medications as of 09/15/2022  Medication Sig   acetaminophen (TYLENOL) 500 MG tablet Take 500 mg by mouth every 6 (six) hours as needed.   ALPRAZolam (XANAX) 0.5 MG tablet Take 1 tablet (0.5 mg total) by mouth 2 (two) times daily.   atorvastatin (LIPITOR) 20 MG tablet Take 1 tablet (20 mg total) by mouth daily.   buPROPion (WELLBUTRIN XL) 150 MG 24 hr tablet Take 150 mg by mouth daily.   calcium carbonate (CALCIUM 600) 600 MG  TABS tablet Take 1 tablet (600 mg total) by mouth 2 (two) times daily with a meal.   Cholecalciferol (VITAMIN D3) 25 MCG (1000 UT) CAPS Take 1 capsule (1,000 Units total) by mouth daily.   denosumab (PROLIA) 60 MG/ML SOSY injection Inject 60 mg into the skin every 6 (six) months.   esomeprazole (NEXIUM) 20 MG capsule Take 1 capsule (20 mg total) by mouth daily at 12 noon.   JARDIANCE 10 MG TABS tablet Take 10 mg by mouth daily.   ketoconazole (NIZORAL) 2 % cream Apply 1 Application topically 2 (two) times daily as needed (groin rash).   LORazepam (ATIVAN) 0.5 MG tablet Take 1 tablet (0.5 mg total) by mouth 3 (three) times daily.   losartan (COZAAR) 100 MG tablet TAKE 1 TABLET BY MOUTH ONCE DAILY   magnesium oxide (MAG-OX) 400 (240 Mg) MG tablet Take 400 mg by mouth daily.    metFORMIN (GLUCOPHAGE) 1000 MG tablet Take 1 tablet (1,000 mg total) by mouth 2 (two) times daily with a meal.   nortriptyline (PAMELOR) 25 MG capsule Take 50 mg by mouth at bedtime.   RSV vaccine recomb adjuvanted (AREXVY) 120 MCG/0.5ML injection Inject into the muscle.   traMADol (ULTRAM) 50 MG tablet Take by mouth every 6 (six) hours as needed.   dapagliflozin propanediol (FARXIGA) 5 MG TABS tablet Take 5 mg by mouth daily. (Patient not taking: Reported on 09/15/2022)   glucose blood (ONETOUCH VERIO) test strip Use to test blood sugar daily. Dx: E11.9 (Patient not taking: Reported on 09/15/2022)   No facility-administered encounter medications on file as of 09/15/2022.    Review of Systems:  Review of Systems  Constitutional:  Negative for activity change and appetite change.  HENT: Negative.    Respiratory:  Negative for cough and shortness of breath.   Cardiovascular:  Negative for leg swelling.  Gastrointestinal:  Negative for constipation.  Genitourinary: Negative.   Musculoskeletal:  Negative for arthralgias, gait problem and myalgias.  Skin: Negative.   Neurological:  Negative for dizziness and weakness.  Psychiatric/Behavioral:  Positive for dysphoric mood. Negative for confusion and sleep disturbance. The patient is nervous/anxious.     Health Maintenance  Topic Date Due   Diabetic kidney evaluation - Urine ACR  09/14/2014   Pneumonia Vaccine 78+ Years old (3 of 3 - PPSV23 or PCV20) 12/21/2018   Medicare Annual Wellness (AWV)  09/24/2022 (Originally 06/17/2021)   COVID-19 Vaccine (7 - 2023-24 season) 10/01/2022 (Originally 03/17/2022)   INFLUENZA VACCINE  11/05/2022   HEMOGLOBIN A1C  01/20/2023   OPHTHALMOLOGY EXAM  02/04/2023   Diabetic kidney evaluation - eGFR measurement  07/21/2023   FOOT EXAM  07/28/2023   DTaP/Tdap/Td (3 - Td or Tdap) 11/29/2029   DEXA SCAN  Completed   Hepatitis C Screening  Completed   Zoster Vaccines- Shingrix  Completed   HPV VACCINES  Aged Out    Colonoscopy  Discontinued    Physical Exam: Vitals:   09/15/22 0816  BP: 116/72  Pulse: 97  Resp: 17  Temp: 98.1 F (36.7 C)  TempSrc: Temporal  SpO2: 97%  Weight: 119 lb 6.4 oz (54.2 kg)  Height: 5' (1.524 m)   Body mass index is 23.32 kg/m. Physical Exam Vitals reviewed.  Constitutional:      Appearance: Normal appearance.  HENT:     Head: Normocephalic.     Nose: Nose normal.     Mouth/Throat:     Mouth: Mucous membranes are moist.     Pharynx: Oropharynx  is clear.  Eyes:     Pupils: Pupils are equal, round, and reactive to light.  Cardiovascular:     Rate and Rhythm: Normal rate and regular rhythm.     Pulses: Normal pulses.     Heart sounds: Normal heart sounds. No murmur heard. Pulmonary:     Effort: Pulmonary effort is normal.     Breath sounds: Normal breath sounds. No rales.  Abdominal:     General: Abdomen is flat. Bowel sounds are normal.     Palpations: Abdomen is soft.  Musculoskeletal:        General: No swelling.     Cervical back: Neck supple.  Skin:    General: Skin is warm.     Comments: Small reddish area on her face below right Eye Not tender or red  Neurological:     General: No focal deficit present.     Mental Status: She is alert and oriented to person, place, and time.  Psychiatric:        Mood and Affect: Mood normal.        Thought Content: Thought content normal.    Labs reviewed: Basic Metabolic Panel: Recent Labs    12/11/21 0000 07/21/22 0000  NA 142 140  K 4.7 4.4  CL 108 106  CO2 21 20  BUN 20 23*  CREATININE 0.9 0.9  CALCIUM 9.2 9.5   Liver Function Tests: No results for input(s): "AST", "ALT", "ALKPHOS", "BILITOT", "PROT", "ALBUMIN" in the last 8760 hours. No results for input(s): "LIPASE", "AMYLASE" in the last 8760 hours. No results for input(s): "AMMONIA" in the last 8760 hours. CBC: Recent Labs    03/12/22 0000  WBC 6.1  HGB 12.7  HCT 39  PLT 414*   Lipid Panel: No results for input(s):  "CHOL", "HDL", "LDLCALC", "TRIG", "CHOLHDL", "LDLDIRECT" in the last 8760 hours. Lab Results  Component Value Date   HGBA1C 8.0 07/21/2022    Procedures since last visit: No results found.  Assessment/Plan 1. Age-related osteoporosis without current pathological fracture Will add fosamax back to her list Will try it Insurance shs not approve Prolia She does not want to do Reclast infusions  2. Anxiety Wants to try Ativan twice a day instead of 3/day Can use Prn dose if she needs it She is under care of Dr Donell Beers She also takes Xanax  3. Depression, major, single episode, severe (HCC) Continues to be her issue Sees Dr Donell Beers On Wellbutrin and Pamelor  4. Mild cognitive impairment Is going to see neurology  5. Face lesion ? Due to irritation from her glasses Try Vaseline and Hydrocortisone   Previous Issues 6  Type 2 diabetes mellitus without complication, without long-term current use of insulin (HCC) A1C elevated at 8 Patient says has been eating jelly beans and has been upset recently  Continue Metformin and Jardiance 7 Left Knee pain Better after steroid injection Wait before Hyla uric acid injections 8 Fungal infection of toenail Using OTC antifungal Lotion And new nail looks Good   9 HTN On Cozaar 10 Insomnia On Pamelor 11 Platelets count elevated Repeat CBC  Labs/tests ordered:  * No order type specified * Next appt:  11/09/2022

## 2022-09-21 ENCOUNTER — Ambulatory Visit: Payer: Medicare Other | Admitting: Diagnostic Neuroimaging

## 2022-09-21 ENCOUNTER — Telehealth: Payer: Self-pay | Admitting: Diagnostic Neuroimaging

## 2022-09-21 ENCOUNTER — Encounter: Payer: Self-pay | Admitting: Diagnostic Neuroimaging

## 2022-09-21 VITALS — BP 121/79 | HR 102 | Ht 60.0 in | Wt 118.2 lb

## 2022-09-21 DIAGNOSIS — F411 Generalized anxiety disorder: Secondary | ICD-10-CM

## 2022-09-21 DIAGNOSIS — R413 Other amnesia: Secondary | ICD-10-CM
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
## 2022-09-21 NOTE — Patient Instructions (Addendum)
MEMORY, COGNITIVE, EMOTIONAL CHANGES (mild cognitive impairment vs mild dementia vs pseudo-dementia related to anxiety / depression) - check MRI brain, B12, TSH - safety / supervision issues reviewed - daily physical activity / exercise (at least 15-30 minutes) - eat more plants / vegetables - increase social activities, brain stimulation, games, puzzles, hobbies, crafts, arts, music - aim for at least 7-8 hours sleep per night (or more) - avoid smoking and alcohol - caution with medications, finances; no driving

## 2022-09-21 NOTE — Telephone Encounter (Signed)
[employer] medicare NPR sent to GI [phone] 

## 2022-09-22 LAB — VITAMIN B12: Vitamin B-12: 438 pg/mL (ref 232–1245)

## 2022-09-22 LAB — TSH RFX ON ABNORMAL TO FREE T4: TSH: 1.31 u[IU]/mL (ref 0.450–4.500)

## 2022-09-28 ENCOUNTER — Ambulatory Visit: Payer: Medicare Other

## 2022-09-29 ENCOUNTER — Other Ambulatory Visit: Payer: Self-pay | Admitting: Internal Medicine

## 2022-09-29 DIAGNOSIS — F419 Anxiety disorder, unspecified: Secondary | ICD-10-CM

## 2022-09-29 MED ORDER — ALPRAZOLAM 0.5 MG PO TABS
0.5000 mg | ORAL_TABLET | Freq: Two times a day (BID) | ORAL | 0 refills | Status: DC
Start: 2022-09-29 — End: 2022-11-02

## 2022-10-09 ENCOUNTER — Telehealth: Payer: Self-pay

## 2022-10-09 MED ORDER — FLEET ENEMA 7-19 GM/118ML RE ENEM: 1.0000 | ENEMA | Freq: Every day | RECTAL | 3 refills | Status: AC | PRN

## 2022-10-09 NOTE — Telephone Encounter (Signed)
Spoke with nurse. Pt is requesting fleets enema. Order given to nurse. She has already tried miralax and a supp.  Has had one hard stool. Will try fleets enema.

## 2022-10-09 NOTE — Telephone Encounter (Signed)
Patient called and states that she's been taking Fosamax as prescribed. However she complains of stomach pain and extreme constipation. Patient states she's been taking a lot of laxatives and they're not working. She wants to know what else she can do to relieve symptoms. Message routed to PCP Mahlon Gammon, MD

## 2022-10-13 ENCOUNTER — Ambulatory Visit
Admission: RE | Admit: 2022-10-13 | Discharge: 2022-10-13 | Disposition: A | Discharge status: Home / Self Care | Payer: Medicare Other | Source: Ambulatory Visit | Attending: Diagnostic Neuroimaging | Admitting: Diagnostic Neuroimaging

## 2022-10-13 ENCOUNTER — Encounter: Payer: Self-pay | Admitting: Internal Medicine

## 2022-10-13 ENCOUNTER — Non-Acute Institutional Stay: Payer: Medicare Other | Admitting: Internal Medicine

## 2022-10-13 ENCOUNTER — Other Ambulatory Visit: Payer: Self-pay | Admitting: Orthopedic Surgery

## 2022-10-13 VITALS — BP 122/84 | HR 96 | Temp 97.8°F | Resp 17 | Ht 60.0 in | Wt 122.0 lb

## 2022-10-13 DIAGNOSIS — R109 Unspecified abdominal pain: Secondary | ICD-10-CM

## 2022-10-13 DIAGNOSIS — M810 Age-related osteoporosis without current pathological fracture: Secondary | ICD-10-CM | POA: Diagnosis not present

## 2022-10-13 DIAGNOSIS — F322 Major depressive disorder, single episode, severe without psychotic features: Secondary | ICD-10-CM

## 2022-10-13 DIAGNOSIS — G3184 Mild cognitive impairment, so stated: Secondary | ICD-10-CM | POA: Diagnosis not present

## 2022-10-13 DIAGNOSIS — R413 Other amnesia: Secondary | ICD-10-CM | POA: Diagnosis not present

## 2022-10-13 DIAGNOSIS — K5901 Slow transit constipation: Secondary | ICD-10-CM | POA: Diagnosis not present

## 2022-10-13 DIAGNOSIS — Z7983 Long term (current) use of bisphosphonates: Secondary | ICD-10-CM

## 2022-10-13 DIAGNOSIS — F419 Anxiety disorder, unspecified: Secondary | ICD-10-CM

## 2022-10-13 MED ORDER — GADOPICLENOL 0.5 MMOL/ML IV SOLN
6.0000 mL | Freq: Once | INTRAVENOUS | Status: AC | PRN
  Administered 2022-10-13: 6 mL via INTRAVENOUS

## 2022-10-13 MED ORDER — LORAZEPAM 0.5 MG PO TABS: 0.5000 mg | ORAL_TABLET | Freq: Three times a day (TID) | ORAL | 1 refills | Status: DC

## 2022-10-13 NOTE — Progress Notes (Signed)
Location: [facility] features editor of Service:  Clinic (12)  Provider:   Code Status:  Goals of Care:     10/13/2022   12:53 PM  Advanced Directives  Does Patient Have a Medical Advance Directive? Yes  Type of Estate agent of [place];Living will;Out of facility DNR (pink MOST or yellow form)  Does patient want to make changes to medical advance directive? No - Patient declined     Chief Complaint  Patient presents with   Acute Visit    Patient would like to discuss medications.   Health Maintenance    Discuss the need for pneumonia vaccine and diabetic kidney evaluation    HPI: Patient is a 77 y.o. female seen today for an acute visit for Severe Constipation and Nausea Abdominal Pain after taking Fosamax one dose  [place]  Recent diagnosis of Osteoporosis Right Femur is -2.5  Prolia not approved in spite of her h/o Dysphagia Insurance wants her to try Fosamax She is willing to try it now Does not want Reclast as has no Transport and gets nevous about IV infusions  Took One dose of Fosamax Felt Nauseated then got abdominal cramps and severe Constipation Took Dulcolax and then Got Diarrhea  Feels Better today no nausea Still has loose stools   [facility] Seen by Neurology Plan for MRI   Other issues Depression and Anxiety Lost husband a year ago Since then struggling with depression Sees [facility] and in house Psychologist   Dysphagia Much better since EGD and Dilatation Back on Nexium Past Medical History:  Diagnosis Date   Allergic rhinitis    Allergy    Anemia    as child    Anxiety    Arthritis    Atrophic vaginitis 2017   Cancer (HCC)    MOHS nose- skin cancer- basal cell    Cataract    removed both eyes    Depression    Diabetes mellitus without complication (HCC)    GERD (gastroesophageal reflux disease)    Heart murmur    as child- outgrown    Hematuria 05/09/2015   had two nonobstructing renal  calculi   Hepatitis A 1970s   Hyperlipidemia    Hypertension    Osteopenia    Osteoporosis    PONV (postoperative nausea and vomiting)    Shingles 06/04/2015   SUI (stress urinary incontinence, female) 2017   Thrombocytopenia (HCC)     Past Surgical History:  Procedure Laterality Date   BACK SURGERY  11/2018   CATARACT EXTRACTION W/ INTRAOCULAR LENS  IMPLANT, BILATERAL Bilateral    COLONOSCOPY     KNEE ARTHROSCOPY Left 2004, 1990s   TONSILLECTOMY AND ADENOIDECTOMY  1952   UPPER GASTROINTESTINAL ENDOSCOPY      Allergies  Allergen Reactions   Keflex [Cephalexin]     rash    Outpatient Encounter Medications as of 10/13/2022  Medication Sig   acetaminophen (TYLENOL) 500 MG tablet Take 500 mg by mouth every 6 (six) hours as needed.   alendronate (FOSAMAX) 70 MG/75ML solution Take 70 mg by mouth every 7 (seven) days. Take with a full glass of water on an empty stomach.   ALPRAZolam (XANAX) 0.5 MG tablet Take 1 tablet (0.5 mg total) by mouth 2 (two) times daily.   atorvastatin (LIPITOR) 20 MG tablet Take 1 tablet (20 mg total) by mouth daily.   buPROPion (WELLBUTRIN XL) 150 MG 24 hr tablet Take 150 mg by mouth daily.  calcium carbonate (CALCIUM 600) 600 MG TABS tablet Take 1 tablet (600 mg total) by mouth 2 (two) times daily with a meal.   Cholecalciferol (VITAMIN D3) 25 MCG (1000 UT) CAPS Take 1 capsule (1,000 Units total) by mouth daily.   esomeprazole (NEXIUM) 20 MG capsule Take 1 capsule (20 mg total) by mouth daily at 12 noon.   glucose blood (ONETOUCH VERIO) test strip Use to test blood sugar daily. Dx: E11.9   JARDIANCE 10 MG TABS tablet Take 10 mg by mouth daily.   ketoconazole (NIZORAL) 2 % cream Apply 1 Application topically 2 (two) times daily as needed (groin rash).   LORazepam (ATIVAN) 0.5 MG tablet Take 1 tablet (0.5 mg total) by mouth 3 (three) times daily.   losartan (COZAAR) 100 MG tablet TAKE 1 TABLET BY MOUTH ONCE DAILY   magnesium oxide (MAG-OX) 400 (240 Mg) MG  tablet Take 400 mg by mouth daily.   metFORMIN (GLUCOPHAGE) 1000 MG tablet Take 1 tablet (1,000 mg total) by mouth 2 (two) times daily with a meal.   nortriptyline (PAMELOR) 25 MG capsule Take 50 mg by mouth at bedtime.   sodium phosphate (FLEET) 7-19 GM/118ML ENEM Place 133 mLs (1 enema total) rectally daily as needed for severe constipation.   No facility-administered encounter medications on file as of 10/13/2022.    Review of Systems:  Review of Systems  Constitutional:  Negative for activity change and appetite change.  HENT: Negative.    Respiratory:  Negative for cough and shortness of breath.   Cardiovascular:  Negative for leg swelling.  Gastrointestinal:  Positive for abdominal distention and diarrhea. Negative for constipation.  Genitourinary: Negative.   Musculoskeletal:  Negative for arthralgias, gait problem and myalgias.  Skin: Negative.   Neurological:  Negative for dizziness and weakness.  Psychiatric/Behavioral:  Negative for confusion, dysphoric mood and sleep disturbance. The patient is nervous/anxious.     Health Maintenance  Topic Date Due   Diabetic kidney evaluation - Urine ACR  09/14/2014   Pneumonia Vaccine 12+ Years old (3 of 3 - PPSV23 or PCV20) 12/21/2018   COVID-19 Vaccine (8 - 2023-24 season) 10/29/2022 (Originally 09/24/2022)   Medicare Annual Wellness (AWV)  11/13/2022 (Originally 06/17/2021)   INFLUENZA VACCINE  11/05/2022   HEMOGLOBIN A1C  01/20/2023   OPHTHALMOLOGY EXAM  02/04/2023   Diabetic kidney evaluation - eGFR measurement  07/21/2023   FOOT EXAM  07/28/2023   DTaP/Tdap/Td (3 - Td or Tdap) 11/29/2029   DEXA SCAN  Completed   Hepatitis C Screening  Completed   Zoster Vaccines- Shingrix  Completed   HPV VACCINES  Aged Out   Colonoscopy  Discontinued    Physical Exam: Vitals:   10/13/22 1250  BP: 122/84  Pulse: 96  Resp: 17  Temp: 97.8 F (36.6 C)  TempSrc: Temporal  SpO2: 97%  Weight: 122 lb (55.3 kg)  Height: 5' (1.524 m)    Body mass index is 23.83 kg/m. Physical Exam Vitals reviewed.  Constitutional:      Appearance: Normal appearance.  HENT:     Head: Normocephalic.     Nose: Nose normal.     Mouth/Throat:     Mouth: Mucous membranes are moist.     Pharynx: Oropharynx is clear.  Eyes:     Pupils: Pupils are equal, round, and reactive to light.  Cardiovascular:     Rate and Rhythm: Normal rate and regular rhythm.     Pulses: Normal pulses.     Heart sounds: Normal heart  sounds. No murmur heard. Pulmonary:     Effort: Pulmonary effort is normal.     Breath sounds: Normal breath sounds.  Abdominal:     General: Abdomen is flat. Bowel sounds are normal. There is no distension.     Palpations: Abdomen is soft.     Tenderness: There is no abdominal tenderness. There is no guarding.  Musculoskeletal:        General: No swelling.     Cervical back: Neck supple.  Skin:    General: Skin is warm.  Neurological:     General: No focal deficit present.     Mental Status: She is alert and oriented to person, place, and time.  Psychiatric:        Mood and Affect: Mood normal.        Thought Content: Thought content normal.     Labs reviewed: Basic Metabolic Panel: Recent Labs    12/11/21 0000 07/21/22 0000 09/21/22 1149  NA 142 140  --   K 4.7 4.4  --   CL 108 106  --   CO2 21 20  --   BUN 20 23*  --   CREATININE 0.9 0.9  --   CALCIUM 9.2 9.5  --   TSH  --   --  1.310   Liver Function Tests: No results for input(s): "AST", "ALT", "ALKPHOS", "BILITOT", "PROT", "ALBUMIN" in the last 8760 hours. No results for input(s): "LIPASE", "AMYLASE" in the last 8760 hours. No results for input(s): "AMMONIA" in the last 8760 hours. CBC: Recent Labs    03/12/22 0000  WBC 6.1  HGB 12.7  HCT 39  PLT 414*   Lipid Panel: No results for input(s): "CHOL", "HDL", "LDLCALC", "TRIG", "CHOLHDL", "LDLDIRECT" in the last 8760 hours. Lab Results  Component Value Date   HGBA1C 8.0 07/21/2022     Procedures since last visit: No results found.  Assessment/Plan 1. H/O ongoing treatment with alendronate (Fosamax) Now having Side effects Will Hold Fosamax  2. Abdominal cramps Resolved now  3. Slow transit constipation Resolved now  4. Age-related osteoporosis without current pathological fracture Hold Fosamax Will try to see if we can do Prolia from her Insurance  5. Mild cognitive impairment Getting MRI today  6. Depression, major, single episode, severe (HCC) Doing better    Labs/tests ordered:  * No order type specified * Next appt:  11/09/2022

## 2022-10-14 ENCOUNTER — Telehealth: Payer: Self-pay | Admitting: *Deleted

## 2022-10-14 ENCOUNTER — Telehealth: Payer: Self-pay

## 2022-10-14 NOTE — Telephone Encounter (Signed)
Patient called with concerns about MRI results. She states that she is very concerned and would ike some type of explanation as to what is going on with her. Please advise.  Message sent to Dr Einar Crow

## 2022-10-14 NOTE — Telephone Encounter (Signed)
Let her know I don't have the results yet. As soon I get the  the results I will call her

## 2022-10-14 NOTE — Telephone Encounter (Signed)
Will place Verification through [employer] and call patient once we get determination.

## 2022-10-14 NOTE — Telephone Encounter (Signed)
Verification placed through [employer] and Awaiting Fax back.   Prior Authorization submitted through [facility] and the Authorization is Pending Review.   Request #: [id number]

## 2022-10-14 NOTE — Telephone Encounter (Signed)
Spoke with patient and let her know that Dr. Chales Abrahams will give her a call once she has the results. Patient verbalized her understanding.

## 2022-10-14 NOTE — Telephone Encounter (Signed)
-----   Message from Mahlon Gammon, MD sent at 10/13/2022  1:21 PM EDT ----- She is aving Side effects to Fosamax. Do you thin k we can now do Prolia ?

## 2022-10-15 ENCOUNTER — Telehealth: Payer: Self-pay | Admitting: Anesthesiology

## 2022-10-15 NOTE — Telephone Encounter (Signed)
-----   Message from Vikram R Penumalli sent at 10/14/2022  6:33 PM EDT ----- Unremarkable imaging results. Please call patient. Continue current plan. -VRP 

## 2022-10-21 DIAGNOSIS — L821 Other seborrheic keratosis: Secondary | ICD-10-CM | POA: Diagnosis not present

## 2022-10-21 DIAGNOSIS — L570 Actinic keratosis: Secondary | ICD-10-CM | POA: Diagnosis not present

## 2022-10-21 DIAGNOSIS — L905 Scar conditions and fibrosis of skin: Secondary | ICD-10-CM | POA: Diagnosis not present

## 2022-10-21 NOTE — Telephone Encounter (Signed)
Received Prior Authorization from [facility] and Prolia was APPROVED 10/14/22-10/14/2023  Authorization #: [id number]  Member [id number]

## 2022-10-30 ENCOUNTER — Telehealth: Payer: Self-pay | Admitting: Internal Medicine

## 2022-10-30 NOTE — Telephone Encounter (Addendum)
We can delay her appointment 3 months with me.

## 2022-10-30 NOTE — Telephone Encounter (Signed)
Patient called in to inquire of Dr Chales Abrahams does she need to keep her scheduled appointment on 11/09/2022 with Peggye Ley since she was seen on 10/13/2022. Please advise asking for a call back at Ph# [phone]

## 2022-11-02 ENCOUNTER — Other Ambulatory Visit: Payer: Self-pay | Admitting: Adult Health

## 2022-11-02 DIAGNOSIS — F419 Anxiety disorder, unspecified: Secondary | ICD-10-CM

## 2022-11-02 MED ORDER — ALPRAZOLAM 0.5 MG PO TABS
0.5000 mg | ORAL_TABLET | Freq: Two times a day (BID) | ORAL | 3 refills | Status: DC
Start: 2022-11-02 — End: 2022-12-30

## 2022-11-03 ENCOUNTER — Encounter: Payer: Self-pay | Admitting: Internal Medicine

## 2022-11-03 ENCOUNTER — Non-Acute Institutional Stay: Payer: Medicare Other | Admitting: Internal Medicine

## 2022-11-03 VITALS — BP 122/68 | HR 94 | Temp 98.0°F | Resp 17 | Ht 60.0 in | Wt 121.0 lb

## 2022-11-03 DIAGNOSIS — I10 Essential (primary) hypertension: Secondary | ICD-10-CM

## 2022-11-03 DIAGNOSIS — F322 Major depressive disorder, single episode, severe without psychotic features: Secondary | ICD-10-CM | POA: Diagnosis not present

## 2022-11-03 DIAGNOSIS — K219 Gastro-esophageal reflux disease without esophagitis: Secondary | ICD-10-CM | POA: Diagnosis not present

## 2022-11-03 DIAGNOSIS — E1169 Type 2 diabetes mellitus with other specified complication: Secondary | ICD-10-CM | POA: Diagnosis not present

## 2022-11-03 DIAGNOSIS — R1319 Other dysphagia: Secondary | ICD-10-CM

## 2022-11-03 DIAGNOSIS — E785 Hyperlipidemia, unspecified: Secondary | ICD-10-CM | POA: Diagnosis not present

## 2022-11-03 DIAGNOSIS — E119 Type 2 diabetes mellitus without complications: Secondary | ICD-10-CM | POA: Diagnosis not present

## 2022-11-03 DIAGNOSIS — M810 Age-related osteoporosis without current pathological fracture: Secondary | ICD-10-CM | POA: Diagnosis not present

## 2022-11-03 DIAGNOSIS — G3184 Mild cognitive impairment, so stated: Secondary | ICD-10-CM | POA: Diagnosis not present

## 2022-11-03 DIAGNOSIS — K5901 Slow transit constipation: Secondary | ICD-10-CM | POA: Diagnosis not present

## 2022-11-03 NOTE — Progress Notes (Signed)
Location:  [facility] features editor of Service:  Clinic (12)  Provider:   Code Status:  Goals of Care:     11/03/2022    8:53 AM  Advanced Directives  Does Patient Have a Medical Advance Directive? Yes  Type of Estate agent of [place];Living will;Out of facility DNR (pink MOST or yellow form)  Does patient want to make changes to medical advance directive? No - Patient declined  Copy of Healthcare Power of Attorney in Chart? No - copy requested     Chief Complaint  Patient presents with   Acute Visit    Patient is being seen to discuss prolia and medications   Immunizations    Patient is due for pnuemonia vaccine    HPI: Patient is a 77 y.o. female seen today for medical management of chronic diseases.   [place] in [place]   Recent diagnosis of Osteoporosis Right Femur is -2.5  Side effects with Fosamax  [facility]CI Saw Neurology MRI no Acute issues  Mild ventriculomegaly  Mild Atropy  MMSE 27/30 No New recommendations per Neurology Anxiety I had cut back her ativan to 2/day  as she was having cognitive issues and Drowsiness She is on Xanax and Ativan per Dr Donell Beers Constipation Still having issues  Past Medical History:  Diagnosis Date   Allergic rhinitis    Allergy    Anemia    as child    Anxiety    Arthritis    Atrophic vaginitis 2017   Cancer (HCC)    MOHS nose- skin cancer- basal cell    Cataract    removed both eyes    Depression    Diabetes mellitus without complication (HCC)    GERD (gastroesophageal reflux disease)    Heart murmur    as child- outgrown    Hematuria 05/09/2015   had two nonobstructing renal calculi   Hepatitis A 1970s   Hyperlipidemia    Hypertension    Osteopenia    Osteoporosis    PONV (postoperative nausea and vomiting)    Shingles 06/04/2015   SUI (stress urinary incontinence, female) 2017   Thrombocytopenia (HCC)     Past Surgical History:  Procedure Laterality Date   BACK  SURGERY  11/2018   CATARACT EXTRACTION W/ INTRAOCULAR LENS  IMPLANT, BILATERAL Bilateral    COLONOSCOPY     KNEE ARTHROSCOPY Left 2004, 1990s   TONSILLECTOMY AND ADENOIDECTOMY  1952   UPPER GASTROINTESTINAL ENDOSCOPY      Allergies  Allergen Reactions   Keflex [Cephalexin]     rash    Outpatient Encounter Medications as of 11/03/2022  Medication Sig   acetaminophen (TYLENOL) 500 MG tablet Take 500 mg by mouth every 6 (six) hours as needed.   ALPRAZolam (XANAX) 0.5 MG tablet Take 1 tablet (0.5 mg total) by mouth 2 (two) times daily.   atorvastatin (LIPITOR) 20 MG tablet Take 1 tablet (20 mg total) by mouth daily.   buPROPion (WELLBUTRIN XL) 150 MG 24 hr tablet Take 150 mg by mouth daily.   calcium carbonate (CALCIUM 600) 600 MG TABS tablet Take 1 tablet (600 mg total) by mouth 2 (two) times daily with a meal.   Cholecalciferol (VITAMIN D3) 25 MCG (1000 UT) CAPS Take 1 capsule (1,000 Units total) by mouth daily.   esomeprazole (NEXIUM) 20 MG capsule Take 1 capsule (20 mg total) by mouth daily at 12 noon.   glucose blood (ONETOUCH VERIO) test strip Use to test blood sugar  daily. Dx: E11.9   JARDIANCE 10 MG TABS tablet Take 10 mg by mouth daily.   ketoconazole (NIZORAL) 2 % cream Apply 1 Application topically 2 (two) times daily as needed (groin rash).   LORazepam (ATIVAN) 0.5 MG tablet Take 1 tablet (0.5 mg total) by mouth 3 (three) times daily.   losartan (COZAAR) 100 MG tablet TAKE 1 TABLET BY MOUTH ONCE DAILY   magnesium oxide (MAG-OX) 400 (240 Mg) MG tablet Take 400 mg by mouth daily.   metFORMIN (GLUCOPHAGE) 1000 MG tablet Take 1 tablet (1,000 mg total) by mouth 2 (two) times daily with a meal.   nortriptyline (PAMELOR) 25 MG capsule Take 50 mg by mouth at bedtime.   sodium phosphate (FLEET) 7-19 GM/118ML ENEM Place 133 mLs (1 enema total) rectally daily as needed for severe constipation.   [DISCONTINUED] alendronate (FOSAMAX) 70 MG/75ML solution Take 70 mg by mouth every 7 (seven)  days. Take with a full glass of water on an empty stomach. (Patient not taking: Reported on 10/13/2022)   No facility-administered encounter medications on file as of 11/03/2022.    Review of Systems:  Review of Systems  Constitutional:  Negative for activity change and appetite change.  HENT: Negative.    Respiratory:  Negative for cough and shortness of breath.   Cardiovascular:  Negative for leg swelling.  Gastrointestinal:  Positive for constipation.  Genitourinary: Negative.   Musculoskeletal:  Negative for arthralgias, gait problem and myalgias.  Skin: Negative.   Neurological:  Negative for dizziness and weakness.  Psychiatric/Behavioral:  Negative for confusion, dysphoric mood and sleep disturbance. The patient is nervous/anxious.     Health Maintenance  Topic Date Due   Diabetic kidney evaluation - Urine ACR  09/14/2014   Pneumonia Vaccine 36+ Years old (3 of 3 - PPSV23 or PCV20) 12/21/2018   Medicare Annual Wellness (AWV)  11/13/2022 (Originally 06/17/2021)   COVID-19 Vaccine (8 - 2023-24 season) 11/19/2022 (Originally 09/24/2022)   INFLUENZA VACCINE  11/05/2022   HEMOGLOBIN A1C  01/20/2023   OPHTHALMOLOGY EXAM  02/04/2023   Diabetic kidney evaluation - eGFR measurement  07/21/2023   FOOT EXAM  07/28/2023   DTaP/Tdap/Td (3 - Td or Tdap) 11/29/2029   DEXA SCAN  Completed   Hepatitis C Screening  Completed   Zoster Vaccines- Shingrix  Completed   HPV VACCINES  Aged Out   Colonoscopy  Discontinued    Physical Exam: Vitals:   11/03/22 0851  BP: 122/68  Pulse: 94  Resp: 17  Temp: 98 F (36.7 C)  TempSrc: Temporal  SpO2: 97%  Weight: 121 lb (54.9 kg)  Height: 5' (1.524 m)   Body mass index is 23.63 kg/m. Physical Exam Vitals reviewed.  Constitutional:      Appearance: Normal appearance.  HENT:     Head: Normocephalic.     Nose: Nose normal.     Mouth/Throat:     Mouth: Mucous membranes are moist.     Pharynx: Oropharynx is clear.  Eyes:     Pupils:  Pupils are equal, round, and reactive to light.  Cardiovascular:     Rate and Rhythm: Normal rate and regular rhythm.     Pulses: Normal pulses.     Heart sounds: Normal heart sounds. No murmur heard. Pulmonary:     Effort: Pulmonary effort is normal.     Breath sounds: Normal breath sounds.  Abdominal:     General: Abdomen is flat. Bowel sounds are normal.     Palpations: Abdomen is soft.  Musculoskeletal:        General: No swelling.     Cervical back: Neck supple.  Skin:    General: Skin is warm.  Neurological:     General: No focal deficit present.     Mental Status: She is alert and oriented to person, place, and time.  Psychiatric:        Mood and Affect: Mood normal.        Thought Content: Thought content normal.     Labs reviewed: Basic Metabolic Panel: Recent Labs    12/11/21 0000 07/21/22 0000 09/21/22 1149  NA 142 140  --   K 4.7 4.4  --   CL 108 106  --   CO2 21 20  --   BUN 20 23*  --   CREATININE 0.9 0.9  --   CALCIUM 9.2 9.5  --   TSH  --   --  1.310   Liver Function Tests: No results for input(s): "AST", "ALT", "ALKPHOS", "BILITOT", "PROT", "ALBUMIN" in the last 8760 hours. No results for input(s): "LIPASE", "AMYLASE" in the last 8760 hours. No results for input(s): "AMMONIA" in the last 8760 hours. CBC: Recent Labs    03/12/22 0000  WBC 6.1  HGB 12.7  HCT 39  PLT 414*   Lipid Panel: No results for input(s): "CHOL", "HDL", "LDLCALC", "TRIG", "CHOLHDL", "LDLDIRECT" in the last 8760 hours. Lab Results  Component Value Date   HGBA1C 8.0 07/21/2022    Procedures since last visit: MR BRAIN W WO CONTRAST  Result Date: 10/14/2022 Table formatting from the original result was not included. [facility] NEUROIMAGING REPORT STUDY DATE: 10/13/22 PATIENT NAME: Kimberly [name] DOB: 23-Nov-1945 MRN: [id number] ORDERING CLINICIAN: Suanne Marker, MD CLINICAL HISTORY: 77 y.o. year old female with: 1. Memory loss   EXAM: MR BRAIN W WO  CONTRAST TECHNIQUE: MRI of the brain with and without contrast was obtained utilizing multiplanar, multiecho pulse sequences. CONTRAST: Diagnostic Product Medications (last 72 hours)   Date/Time Action Medication Dose  10/13/22 1531 Contrast Given  gadopiclenol (VUEWAY) 0.5 MMOL/ML solution 6 mL 6 mL   COMPARISON: none IMAGING SITE: [facility] AT [address] [place] [address] FINDINGS: No abnormal lesions are seen on diffusion-weighted views to suggest acute ischemia. The cortical sulci, fissures and cisterns are notable for mild generalized and moderate perisylvian atrophy. Lateral, third and fourth ventricle are mildly large on ex vacuo basis. No extra-axial fluid collections are seen. No evidence of mass effect or midline shift.  Mild bilateral infraputaminal cysts.  Mild periventricular and subcortical foci of nonspecific T2 hyperintensities.  No abnormal lesions are seen on post contrast views.  On sagittal views the posterior fossa, pituitary gland and corpus callosum are notable for enlarged partially empty sella. No evidence of intracranial hemorrhage on SWI views. The orbits and their contents, paranasal sinuses and calvarium are unremarkable.  Intracranial flow voids are present.   MRI brain (with and without) demonstrating: -Mild generalized and moderate perisylvian atrophy; mild ventriculomegaly on ex vacuo basis. -Mild chronic small vessel ischemic disease. -No acute findings. INTERPRETING PHYSICIAN: Suanne Marker, MD Certified in Neurology, Neurophysiology and Neuroimaging [facility] [address] [place], [place] [address] [phone]   Assessment/Plan 1. Age-related osteoporosis without current pathological fracture Approved for Prolia Will Schedule in our office for the shot CMP before the shot  2. Slow transit constipation Take Colace at night and Prune juice in the  morning Miralax  PRn  3. Depression, major, single episode, severe (HCC) On Wellbutrin , Pamelor  Also oN ativan and Xanax per Dr Donell Beers On Med Management  4. Mild cognitive impairment MRI no acute issues  Seen by Neurology Mostly due to her anxiety  5. Type 2 diabetes mellitus without complication, without long-term current use of insulin (HCC) Repeat A1C Metformin and Jardiance  6. Hyperlipidemia associated with type 2 diabetes mellitus (HCC) Repeat Lipid On statin 7. Esophageal dysphagia No Issues Fosamax was stopped due to side effects  8. Essential hypertension Cozaar and   9. Gastroesophageal reflux disease without esophagitis Nexium    Labs/tests ordered:  CBC,CMP,TSH,A1C Lipid , Vit D 2 weeks Next appt:  Visit date not found

## 2022-11-06 ENCOUNTER — Telehealth: Payer: Self-pay | Admitting: *Deleted

## 2022-11-06 DIAGNOSIS — M1711 Unilateral primary osteoarthritis, right knee: Secondary | ICD-10-CM | POA: Diagnosis not present

## 2022-11-06 NOTE — Telephone Encounter (Signed)
[employer] Verification was received, Patient has a 20% Copay and $30 Admin Fee =$351  Patient Notified and agreed and scheduled an appointment for 11/24/2022.

## 2022-11-06 NOTE — Telephone Encounter (Signed)
Called patient and scheduled an appointment for a Prolia Injection for 11/24/2022  Patient stated that she has been getting Knee Injections weekly and her last injection is on 11/20/2022 and wants to be sure this is not going to Interfere with getting the Prolia Injection on 11/24/2022  Please Advise.

## 2022-11-09 ENCOUNTER — Encounter: Payer: Medicare Other | Admitting: Adult Health

## 2022-11-09 NOTE — Telephone Encounter (Signed)
She should be fine for her Prolia. No Interactions

## 2022-11-09 NOTE — Telephone Encounter (Signed)
Patient notified and agreed.  

## 2022-11-13 DIAGNOSIS — M1711 Unilateral primary osteoarthritis, right knee: Secondary | ICD-10-CM | POA: Diagnosis not present

## 2022-11-17 DIAGNOSIS — E1169 Type 2 diabetes mellitus with other specified complication: Secondary | ICD-10-CM | POA: Diagnosis not present

## 2022-11-17 DIAGNOSIS — E1065 Type 1 diabetes mellitus with hyperglycemia: Secondary | ICD-10-CM | POA: Diagnosis not present

## 2022-11-17 DIAGNOSIS — E559 Vitamin D deficiency, unspecified: Secondary | ICD-10-CM | POA: Diagnosis not present

## 2022-11-17 LAB — CBC: RBC: 4.04 (ref 3.87–5.11)

## 2022-11-17 LAB — CBC AND DIFFERENTIAL
HCT: 36 (ref 36–46)
Hemoglobin: 12.1 (ref 12.0–16.0)
Neutrophils Absolute: 3.7
Platelets: 411 10*3/uL — AB (ref 150–400)
WBC: 6.1

## 2022-11-17 LAB — BASIC METABOLIC PANEL
BUN: 20 (ref 4–21)
CO2: 21 (ref 13–22)
Chloride: 106 (ref 99–108)
Creatinine: 1.2 — AB (ref 0.5–1.1)
Glucose: 111
Potassium: 4.8 mEq/L (ref 3.5–5.1)
Sodium: 140 (ref 137–147)

## 2022-11-17 LAB — COMPREHENSIVE METABOLIC PANEL
Albumin: 4.3 (ref 3.5–5.0)
Calcium: 9.5 (ref 8.7–10.7)
Globulin: 1.7
eGFR: 44

## 2022-11-17 LAB — HEMOGLOBIN A1C: Hemoglobin A1C: 7.6

## 2022-11-17 LAB — HEPATIC FUNCTION PANEL
ALT: 15 U/L (ref 7–35)
AST: 18 (ref 13–35)
Alkaline Phosphatase: 80 (ref 25–125)
Bilirubin, Total: 0.2

## 2022-11-17 LAB — LIPID PANEL
Cholesterol: 121 (ref 0–200)
HDL: 59 (ref 35–70)
LDL Cholesterol: 36
Triglycerides: 126 (ref 40–160)

## 2022-11-20 DIAGNOSIS — M1711 Unilateral primary osteoarthritis, right knee: Secondary | ICD-10-CM | POA: Diagnosis not present

## 2022-11-24 ENCOUNTER — Ambulatory Visit (INDEPENDENT_AMBULATORY_CARE_PROVIDER_SITE_OTHER): Payer: Medicare Other

## 2022-11-24 DIAGNOSIS — M810 Age-related osteoporosis without current pathological fracture: Secondary | ICD-10-CM

## 2022-11-24 MED ORDER — DENOSUMAB 60 MG/ML ~~LOC~~ SOSY
60.0000 mg | PREFILLED_SYRINGE | Freq: Once | SUBCUTANEOUS | Status: AC
Start: 2022-11-24 — End: 2022-11-24
  Administered 2022-11-24: 60 mg via SUBCUTANEOUS

## 2022-12-24 ENCOUNTER — Other Ambulatory Visit: Payer: Self-pay | Admitting: Adult Health

## 2022-12-24 DIAGNOSIS — F419 Anxiety disorder, unspecified: Secondary | ICD-10-CM

## 2022-12-24 MED ORDER — LORAZEPAM 0.5 MG PO TABS: 0.5000 mg | ORAL_TABLET | Freq: Two times a day (BID) | ORAL | Status: DC

## 2022-12-30 ENCOUNTER — Telehealth: Payer: Medicare Other

## 2022-12-30 DIAGNOSIS — F419 Anxiety disorder, unspecified: Secondary | ICD-10-CM

## 2022-12-30 NOTE — Telephone Encounter (Signed)
Talked to clinic nurse and increased the Ativan to TID

## 2022-12-30 NOTE — Telephone Encounter (Signed)
Medication list updated to reflect this change.

## 2022-12-30 NOTE — Telephone Encounter (Signed)
Message left on clinical intake voicemail:  Patient recently seen psych and they suggested that she take lorazepam three times daily.   Call returned to patient and patient states she spoke with Herbert Seta, [place] nurse at [facility] and she has it all figured out with Dr.Plosksy. Patient states she does not need anything from [place] at this time. I will forward to Dr.Gupta to confirm it's ok to change lorazepam to TID on active medication list     FYI

## 2022-12-30 NOTE — Addendum Note (Signed)
Addended by: Maurice Small on: 12/30/2022 03:12 PM   Modules accepted: Orders

## 2023-01-11 DIAGNOSIS — M1711 Unilateral primary osteoarthritis, right knee: Secondary | ICD-10-CM | POA: Diagnosis not present

## 2023-01-13 ENCOUNTER — Telehealth: Payer: Self-pay

## 2023-01-13 NOTE — Telephone Encounter (Signed)
Providers response:  Mahlon Gammon, MD  You; Rudi Heap, CMA2 minutes ago (12:23 PM)    She does need Labs before her appointment. I have it in my note and ordered it to clinic Nurse in [place] Tell her to call either Herbert Seta or Aut[place] and ask them when she can have them done. Thanks    Spoke with patient and she is aware of Dr.Gupta's response.

## 2023-01-13 NOTE — Telephone Encounter (Signed)
Message left on clinical intake voicemail:   Patient has an appointment on 02/02/23 and questions if she will need lab work prior. Patient states " I don't want to be called the day before saying I have a lab appointment the next day."  No patient instructions were on last AVS indicating patient needs labs prior to next appointment, please advise

## 2023-02-02 ENCOUNTER — Non-Acute Institutional Stay: Payer: Medicare Other | Admitting: Internal Medicine

## 2023-02-02 ENCOUNTER — Encounter: Payer: Self-pay | Admitting: Internal Medicine

## 2023-02-02 VITALS — BP 122/78 | HR 100 | Temp 98.1°F | Resp 18 | Ht 60.0 in | Wt 118.6 lb

## 2023-02-02 DIAGNOSIS — E119 Type 2 diabetes mellitus without complications: Secondary | ICD-10-CM | POA: Diagnosis not present

## 2023-02-02 DIAGNOSIS — K5901 Slow transit constipation: Secondary | ICD-10-CM | POA: Diagnosis not present

## 2023-02-02 DIAGNOSIS — M810 Age-related osteoporosis without current pathological fracture: Secondary | ICD-10-CM

## 2023-02-02 DIAGNOSIS — F322 Major depressive disorder, single episode, severe without psychotic features: Secondary | ICD-10-CM

## 2023-02-02 DIAGNOSIS — G3184 Mild cognitive impairment, so stated: Secondary | ICD-10-CM

## 2023-02-02 DIAGNOSIS — E1169 Type 2 diabetes mellitus with other specified complication: Secondary | ICD-10-CM

## 2023-02-02 DIAGNOSIS — I10 Essential (primary) hypertension: Secondary | ICD-10-CM

## 2023-02-02 DIAGNOSIS — K219 Gastro-esophageal reflux disease without esophagitis: Secondary | ICD-10-CM

## 2023-02-02 DIAGNOSIS — E785 Hyperlipidemia, unspecified: Secondary | ICD-10-CM

## 2023-02-02 DIAGNOSIS — R1319 Other dysphagia: Secondary | ICD-10-CM

## 2023-02-02 NOTE — Progress Notes (Signed)
Location:  [facility] features editor of Service:  Clinic (12)  Provider:   Code Status:  Goals of Care:     02/02/2023    9:49 AM  Advanced Directives  Does Patient Have a Medical Advance Directive? Yes  Type of Estate agent of [place];Living will;Out of facility DNR (pink MOST or yellow form)  Does patient want to make changes to medical advance directive? No - Patient declined  Copy of Healthcare Power of Attorney in Chart? No - copy requested     Chief Complaint  Patient presents with   Medical Management of Chronic Issues    Patient is being seen for her 3 month follow up   Immunizations    Patient is due for a pneumonia vaccine   Health Maintenance    Discuss AWV and Diabetic kidney evaluation    HPI: Patient is a 77 y.o. female seen today for medical management of chronic diseases.    [place] in [place]  Diabetes Mellitus Has Follow up with Endocrinology Will Get her labs with them Last A1C was 7.6  Recent diagnosis of Osteoporosis Right Femur is -2.5  Side effects with Fosamax  Now on Prolia MCI Saw Neurology MRI no Acute issues  Mild ventriculomegaly  Mild Atropy  MMSE 27/30 No New recommendations per Neurology Anxiety Sees Dr Donell Beers On Ativan and Xanax Did not tolerate Taper Constipation  Wt Readings from Last 3 Encounters:  02/02/23 118 lb 9.6 oz (53.8 kg)  11/03/22 121 lb (54.9 kg)  10/13/22 122 lb (55.3 kg)      Past Medical History:  Diagnosis Date   Allergic rhinitis    Allergy    Anemia    as child    Anxiety    Arthritis    Atrophic vaginitis 2017   Cancer (HCC)    MOHS nose- skin cancer- basal cell    Cataract    removed both eyes    Depression    Diabetes mellitus without complication (HCC)    GERD (gastroesophageal reflux disease)    Heart murmur    as child- outgrown    Hematuria 05/09/2015   had two nonobstructing renal calculi   Hepatitis A 1970s   Hyperlipidemia     Hypertension    Osteopenia    Osteoporosis    PONV (postoperative nausea and vomiting)    Shingles 06/04/2015   SUI (stress urinary incontinence, female) 2017   Thrombocytopenia (HCC)     Past Surgical History:  Procedure Laterality Date   BACK SURGERY  11/2018   CATARACT EXTRACTION W/ INTRAOCULAR LENS  IMPLANT, BILATERAL Bilateral    COLONOSCOPY     KNEE ARTHROSCOPY Left 2004, 1990s   TONSILLECTOMY AND ADENOIDECTOMY  1952   UPPER GASTROINTESTINAL ENDOSCOPY      Allergies  Allergen Reactions   Keflex [Cephalexin]     rash    Outpatient Encounter Medications as of 02/02/2023  Medication Sig   ALPRAZolam (XANAX) 0.5 MG tablet Take 1 tablet (0.5 mg total) by mouth 3 (three) times daily.   atorvastatin (LIPITOR) 20 MG tablet Take 1 tablet (20 mg total) by mouth daily.   buPROPion (WELLBUTRIN XL) 150 MG 24 hr tablet Take 150 mg by mouth daily.   Cholecalciferol (VITAMIN D3) 25 MCG (1000 UT) CAPS Take 1 capsule (1,000 Units total) by mouth daily.   esomeprazole (NEXIUM) 20 MG capsule Take 1 capsule (20 mg total) by mouth daily at 12 noon.   glucose blood (  ONETOUCH VERIO) test strip Use to test blood sugar daily. Dx: E11.9   JARDIANCE 10 MG TABS tablet Take 10 mg by mouth daily.   losartan (COZAAR) 100 MG tablet TAKE 1 TABLET BY MOUTH ONCE DAILY   magnesium oxide (MAG-OX) 400 (240 Mg) MG tablet Take 400 mg by mouth daily.   metFORMIN (GLUCOPHAGE) 1000 MG tablet Take 1 tablet (1,000 mg total) by mouth 2 (two) times daily with a meal.   nortriptyline (PAMELOR) 25 MG capsule Take 50 mg by mouth at bedtime.   acetaminophen (TYLENOL) 500 MG tablet Take 500 mg by mouth every 6 (six) hours as needed. (Patient not taking: Reported on 02/02/2023)   calcium carbonate (CALCIUM 600) 600 MG TABS tablet Take 1 tablet (600 mg total) by mouth 2 (two) times daily with a meal. (Patient not taking: Reported on 02/02/2023)   ketoconazole (NIZORAL) 2 % cream Apply 1 Application topically 2 (two) times  daily as needed (groin rash). (Patient not taking: Reported on 02/02/2023)   No facility-administered encounter medications on file as of 02/02/2023.    Review of Systems:  Review of Systems  Constitutional:  Negative for activity change and appetite change.  HENT: Negative.    Respiratory:  Negative for cough and shortness of breath.   Cardiovascular:  Negative for leg swelling.  Gastrointestinal:  Negative for constipation.  Genitourinary: Negative.   Musculoskeletal:  Negative for arthralgias, gait problem and myalgias.  Skin: Negative.   Neurological:  Negative for dizziness and weakness.  Psychiatric/Behavioral:  Positive for dysphoric mood. Negative for confusion and sleep disturbance. The patient is nervous/anxious.     Health Maintenance  Topic Date Due   Diabetic kidney evaluation - Urine ACR  09/14/2014   Pneumonia Vaccine 45+ Years old (3 of 3 - PPSV23 or PCV20) 12/21/2018   Medicare Annual Wellness (AWV)  06/17/2021   OPHTHALMOLOGY EXAM  02/04/2023   COVID-19 Vaccine (9 - 2023-24 season) 03/04/2023   HEMOGLOBIN A1C  05/20/2023   FOOT EXAM  07/28/2023   Diabetic kidney evaluation - eGFR measurement  11/17/2023   DTaP/Tdap/Td (3 - Td or Tdap) 11/29/2029   INFLUENZA VACCINE  Completed   DEXA SCAN  Completed   Hepatitis C Screening  Completed   Zoster Vaccines- Shingrix  Completed   HPV VACCINES  Aged Out   Colonoscopy  Discontinued    Physical Exam: Vitals:   02/02/23 0946  BP: 122/78  Pulse: 100  Resp: 18  Temp: 98.1 F (36.7 C)  TempSrc: Temporal  SpO2: 95%  Weight: 118 lb 9.6 oz (53.8 kg)  Height: 5' (1.524 m)   Body mass index is 23.16 kg/m. Physical Exam Vitals reviewed.  Constitutional:      Appearance: Normal appearance.  HENT:     Head: Normocephalic.     Nose: Nose normal.     Mouth/Throat:     Mouth: Mucous membranes are moist.     Pharynx: Oropharynx is clear.  Eyes:     Pupils: Pupils are equal, round, and reactive to light.   Cardiovascular:     Rate and Rhythm: Normal rate and regular rhythm.     Pulses: Normal pulses.     Heart sounds: Normal heart sounds. No murmur heard. Pulmonary:     Effort: Pulmonary effort is normal.     Breath sounds: Normal breath sounds.  Abdominal:     General: Abdomen is flat. Bowel sounds are normal.     Palpations: Abdomen is soft.  Musculoskeletal:  General: No swelling.     Cervical back: Neck supple.  Skin:    General: Skin is warm.  Neurological:     General: No focal deficit present.     Mental Status: She is alert and oriented to person, place, and time.  Psychiatric:        Mood and Affect: Mood normal.        Thought Content: Thought content normal.     Labs reviewed: Basic Metabolic Panel: Recent Labs    07/21/22 0000 09/21/22 1149 11/17/22 0000  NA 140  --  140  K 4.4  --  4.8  CL 106  --  106  CO2 20  --  21  BUN 23*  --  20  CREATININE 0.9  --  1.2*  CALCIUM 9.5  --  9.5  TSH  --  1.310  --    Liver Function Tests: Recent Labs    11/17/22 0000  AST 18  ALT 15  ALKPHOS 80  ALBUMIN 4.3   No results for input(s): "LIPASE", "AMYLASE" in the last 8760 hours. No results for input(s): "AMMONIA" in the last 8760 hours. CBC: Recent Labs    03/12/22 0000 11/17/22 0000  WBC 6.1 6.1  NEUTROABS  --  3.70  HGB 12.7 12.1  HCT 39 36  PLT 414* 411*   Lipid Panel: Recent Labs    11/17/22 0000  CHOL 121  HDL 59  LDLCALC 36  TRIG 126   Lab Results  Component Value Date   HGBA1C 7.6 11/17/2022    Procedures since last visit: No results found.  Assessment/Plan 1. Age-related osteoporosis without current pathological fracture Prolia  2. Type 2 diabetes mellitus without complication, without long-term current use of insulin (HCC) Metformin and Jardiance Has Appointment with endocrinology Will let them do the Labs  3. Mild cognitive impairment MRI no acute issues  Seen by Neurology Mostly due to her anxiety  Is not  driving anymore  4. Depression, major, single episode, severe (HCC)with Severe Anxiety On Wellbutrin , Pamelor  Also oN ativan and Xanax per Dr Donell Beers On Med Management  5. Esophageal dysphagia Nexium  6. Hyperlipidemia associated with type 2 diabetes mellitus (HCC) statin  7. Gastroesophageal reflux disease without esophagitis Nexium  8. Essential hypertension Cozaar  9. Slow transit constipation Prune Juice pRN    Labs/tests ordered:  * No order type specified * Next appt:  06/07/2023

## 2023-02-09 DIAGNOSIS — E119 Type 2 diabetes mellitus without complications: Secondary | ICD-10-CM | POA: Diagnosis not present

## 2023-03-01 ENCOUNTER — Other Ambulatory Visit: Payer: Self-pay | Admitting: Adult Health

## 2023-03-01 MED ORDER — LORAZEPAM 0.5 MG PO TABS: 0.5000 mg | ORAL_TABLET | Freq: Three times a day (TID) | ORAL | 3 refills | Status: DC

## 2023-03-03 DIAGNOSIS — E1169 Type 2 diabetes mellitus with other specified complication: Secondary | ICD-10-CM | POA: Diagnosis not present

## 2023-03-03 DIAGNOSIS — B351 Tinea unguium: Secondary | ICD-10-CM | POA: Diagnosis not present

## 2023-03-03 DIAGNOSIS — M8580 Other specified disorders of bone density and structure, unspecified site: Secondary | ICD-10-CM | POA: Diagnosis not present

## 2023-03-03 DIAGNOSIS — I10 Essential (primary) hypertension: Secondary | ICD-10-CM | POA: Diagnosis not present

## 2023-03-03 DIAGNOSIS — E559 Vitamin D deficiency, unspecified: Secondary | ICD-10-CM | POA: Diagnosis not present

## 2023-03-03 DIAGNOSIS — E1165 Type 2 diabetes mellitus with hyperglycemia: Secondary | ICD-10-CM | POA: Diagnosis not present

## 2023-03-26 ENCOUNTER — Telehealth: Payer: Medicare Other | Admitting: Adult Health

## 2023-03-26 DIAGNOSIS — L03039 Cellulitis of unspecified toe: Secondary | ICD-10-CM

## 2023-03-26 MED ORDER — DOXYCYCLINE HYCLATE 100 MG PO TABS: 100.0000 mg | ORAL_TABLET | Freq: Two times a day (BID) | ORAL | 0 refills | Status: AC

## 2023-03-26 NOTE — Telephone Encounter (Signed)
Nurse reports Kimberly [name] trimmed her own right great toenail.  Now the area is swollen and red and she is a diabetic. Has an allergy to Keflex. Will prescribe Doxycycline. Nurse to have resident f/u with the podiatrist.

## 2023-04-21 DIAGNOSIS — L821 Other seborrheic keratosis: Secondary | ICD-10-CM | POA: Diagnosis not present

## 2023-04-21 DIAGNOSIS — L814 Other melanin hyperpigmentation: Secondary | ICD-10-CM | POA: Diagnosis not present

## 2023-04-21 DIAGNOSIS — L905 Scar conditions and fibrosis of skin: Secondary | ICD-10-CM | POA: Diagnosis not present

## 2023-04-21 DIAGNOSIS — D229 Melanocytic nevi, unspecified: Secondary | ICD-10-CM | POA: Diagnosis not present

## 2023-04-28 ENCOUNTER — Other Ambulatory Visit: Payer: Self-pay | Admitting: *Deleted

## 2023-04-28 DIAGNOSIS — M810 Age-related osteoporosis without current pathological fracture: Secondary | ICD-10-CM

## 2023-04-28 MED ORDER — DENOSUMAB 60 MG/ML ~~LOC~~ SOSY
60.0000 mg | PREFILLED_SYRINGE | Freq: Once | SUBCUTANEOUS | Status: AC
Start: 2023-04-28 — End: 2023-05-27
  Administered 2023-05-27: 60 mg via SUBCUTANEOUS

## 2023-05-11 ENCOUNTER — Telehealth: Payer: Self-pay | Admitting: *Deleted

## 2023-05-11 DIAGNOSIS — E1140 Type 2 diabetes mellitus with diabetic neuropathy, unspecified: Secondary | ICD-10-CM | POA: Diagnosis not present

## 2023-05-11 DIAGNOSIS — L602 Onychogryphosis: Secondary | ICD-10-CM | POA: Diagnosis not present

## 2023-05-11 DIAGNOSIS — M79672 Pain in left foot: Secondary | ICD-10-CM | POA: Diagnosis not present

## 2023-05-11 DIAGNOSIS — M79671 Pain in right foot: Secondary | ICD-10-CM | POA: Diagnosis not present

## 2023-05-11 DIAGNOSIS — B351 Tinea unguium: Secondary | ICD-10-CM | POA: Diagnosis not present

## 2023-05-11 NOTE — Telephone Encounter (Signed)
Patient has a Prolia Injection appointment on 06/07/2023 and needs updated labwork done before.   Please place orders and appointment there at [facility] for patient to have done before 06/07/2023.

## 2023-05-11 NOTE — Telephone Encounter (Signed)
Verification Submitted and there is a 20% copay with $30 Admin Fee. ($351)  Prolia Appt: 06/07/2023

## 2023-05-12 NOTE — Telephone Encounter (Signed)
 Kimberly Calamity, NP  You19 hours ago (5:47 PM)    I have ordered labs through the clinic nurse.

## 2023-05-17 ENCOUNTER — Other Ambulatory Visit: Payer: Medicare Other

## 2023-05-18 DIAGNOSIS — E119 Type 2 diabetes mellitus without complications: Secondary | ICD-10-CM | POA: Diagnosis not present

## 2023-05-18 LAB — BASIC METABOLIC PANEL
BUN: 18 (ref 4–21)
CO2: 19 (ref 13–22)
Chloride: 108 (ref 99–108)
Creatinine: 0.9 (ref 0.5–1.1)
Glucose: 116
Potassium: 4.8 meq/L (ref 3.5–5.1)
Sodium: 140 (ref 137–147)

## 2023-05-18 LAB — CBC: RBC: 4.18 (ref 3.87–5.11)

## 2023-05-18 LAB — CBC AND DIFFERENTIAL
HCT: 37 (ref 36–46)
Hemoglobin: 12.3 (ref 12.0–16.0)
Platelets: 426 10*3/uL — AB (ref 150–400)
WBC: 6.5

## 2023-05-18 LAB — HEMOGLOBIN A1C: Hemoglobin A1C: 7.2

## 2023-05-18 LAB — COMPREHENSIVE METABOLIC PANEL
Calcium: 9.2 (ref 8.7–10.7)
eGFR: 67

## 2023-05-19 ENCOUNTER — Encounter: Payer: Self-pay | Admitting: Internal Medicine

## 2023-05-24 ENCOUNTER — Ambulatory Visit: Payer: Medicare Other

## 2023-05-25 ENCOUNTER — Non-Acute Institutional Stay: Payer: Medicare Other | Admitting: Internal Medicine

## 2023-05-25 VITALS — BP 124/80 | HR 100 | Temp 97.0°F | Resp 18 | Ht 60.0 in | Wt 120.8 lb

## 2023-05-25 DIAGNOSIS — E785 Hyperlipidemia, unspecified: Secondary | ICD-10-CM | POA: Diagnosis not present

## 2023-05-25 DIAGNOSIS — K219 Gastro-esophageal reflux disease without esophagitis: Secondary | ICD-10-CM

## 2023-05-25 DIAGNOSIS — M65331 Trigger finger, right middle finger: Secondary | ICD-10-CM | POA: Diagnosis not present

## 2023-05-25 DIAGNOSIS — M810 Age-related osteoporosis without current pathological fracture: Secondary | ICD-10-CM | POA: Diagnosis not present

## 2023-05-25 DIAGNOSIS — G3184 Mild cognitive impairment, so stated: Secondary | ICD-10-CM

## 2023-05-25 DIAGNOSIS — K5901 Slow transit constipation: Secondary | ICD-10-CM

## 2023-05-25 DIAGNOSIS — E119 Type 2 diabetes mellitus without complications: Secondary | ICD-10-CM | POA: Diagnosis not present

## 2023-05-25 DIAGNOSIS — E1169 Type 2 diabetes mellitus with other specified complication: Secondary | ICD-10-CM | POA: Diagnosis not present

## 2023-05-25 DIAGNOSIS — F322 Major depressive disorder, single episode, severe without psychotic features: Secondary | ICD-10-CM

## 2023-05-25 DIAGNOSIS — R1319 Other dysphagia: Secondary | ICD-10-CM | POA: Diagnosis not present

## 2023-05-25 NOTE — Progress Notes (Signed)
 Location:  [facility] features editor of Service:  Clinic (12)  Provider:   Code Status:  Goals of Care:     05/25/2023    8:46 AM  Advanced Directives  Does Patient Have a Medical Advance Directive? Yes  Type of Estate agent of [place];Living will;Out of facility DNR (pink MOST or yellow form)  Does patient want to make changes to medical advance directive? No - Patient declined     Chief Complaint  Patient presents with   Hand Pain    Right hand pain and 3rd finger pain    HPI: Patient is a 78 y.o. female seen today for medical management of chronic diseases.   [place] in [place]/o Diabetes Mellitus,Osteoporosis,MCI and Anxiety   Discussed the use of AI scribe software for clinical note transcription with the patient, who gave verbal consent to proceed.  History of Present Illness   Trigger finger Kimberly [name] is a 78 year old female who presents with right hand middle finger pain impacting her ability to play the piano.  She experiences pain in the middle finger of her right hand, which she identifies as her second finger. The pain significantly hinders her ability to play the piano, although wearing a glove allows her to continue playing. She has not had any prior injections for this issue but is considering it after hearing positive feedback from others. Diabetes She manages diabetes with a recent A1c of 7.2, improved from a previous level of 8, attributed to dietary changes, including avoiding candy and reducing meal portions. She takes metformin, which affects her appetite, leading to reduced food intake. Regular bowel movements are reported without accidents. Anxiety She has a history of anxiety and is under the care of a psychiatrist and therapist. Sleepiness as a side effect of her medication has been addressed with adjustments. Occasional memory lapses, such as forgetting names, are noted but are considered normal by her  psychiatrist and therapist. Constipation She has a history of constipation, managed with glycerin tablets and occasionally Senna Plus. She acknowledges not drinking enough water and plans to increase her intake to help with bowel regularity. Dysphagia She takes Nexium for swallowing issues, which are currently well-managed.   Good sleep quality is reported, aided by a nightly sleeping pill, and she avoids caffeine in the evening to prevent sleep disturbances.  She engages in regular physical activity, walking her dog at least two miles daily. She uses an electric machine to strengthen her legs and has noticed improvement in leg strength.              Past Medical History:  Diagnosis Date   Allergic rhinitis    Allergy    Anemia    as child    Anxiety    Arthritis    Atrophic vaginitis 2017   Cancer (HCC)    MOHS nose- skin cancer- basal cell    Cataract    removed both eyes    Depression    Diabetes mellitus without complication (HCC)    GERD (gastroesophageal reflux disease)    Heart murmur    as child- outgrown    Hematuria 05/09/2015   had two nonobstructing renal calculi   Hepatitis A 1970s   Hyperlipidemia    Hypertension    Osteopenia    Osteoporosis    PONV (postoperative nausea and vomiting)    Shingles 06/04/2015   SUI (stress urinary incontinence, female) 2017   Thrombocytopenia (HCC)  Past Surgical History:  Procedure Laterality Date   BACK SURGERY  11/2018   CATARACT EXTRACTION W/ INTRAOCULAR LENS  IMPLANT, BILATERAL Bilateral    COLONOSCOPY     KNEE ARTHROSCOPY Left 2004, 1990s   TONSILLECTOMY AND ADENOIDECTOMY  1952   UPPER GASTROINTESTINAL ENDOSCOPY      Allergies  Allergen Reactions   Keflex [Cephalexin]     rash    Outpatient Encounter Medications as of 05/25/2023  Medication Sig   acetaminophen (TYLENOL) 500 MG tablet Take 500 mg by mouth every 6 (six) hours as needed.   ALPRAZolam (XANAX) 0.5 MG tablet Take 1 tablet (0.5 mg total) by  mouth 3 (three) times daily.   atorvastatin (LIPITOR) 20 MG tablet Take 1 tablet (20 mg total) by mouth daily.   buPROPion (WELLBUTRIN XL) 150 MG 24 hr tablet Take 150 mg by mouth daily.   calcium carbonate (CALCIUM 600) 600 MG TABS tablet Take 1 tablet (600 mg total) by mouth 2 (two) times daily with a meal.   Cholecalciferol (VITAMIN D3) 25 MCG (1000 UT) CAPS Take 1 capsule (1,000 Units total) by mouth daily.   esomeprazole (NEXIUM) 20 MG capsule Take 1 capsule (20 mg total) by mouth daily at 12 noon.   JARDIANCE 10 MG TABS tablet Take 10 mg by mouth daily.   ketoconazole (NIZORAL) 2 % cream Apply 1 Application topically 2 (two) times daily as needed (groin rash).   LORazepam (ATIVAN) 0.5 MG tablet Take 1 tablet (0.5 mg total) by mouth in the morning, at noon, and at bedtime.   losartan (COZAAR) 100 MG tablet TAKE 1 TABLET BY MOUTH ONCE DAILY   magnesium oxide (MAG-OX) 400 (240 Mg) MG tablet Take 400 mg by mouth daily.   metFORMIN (GLUCOPHAGE) 1000 MG tablet Take 1 tablet (1,000 mg total) by mouth 2 (two) times daily with a meal.   nortriptyline (PAMELOR) 25 MG capsule Take 50 mg by mouth at bedtime.   denosumab (PROLIA) 60 MG/ML SOSY injection Inject 60 mg into the skin every 6 (six) months.   glucose blood (ONETOUCH VERIO) test strip Use to test blood sugar daily. Dx: E11.9   Facility-Administered Encounter Medications as of 05/25/2023  Medication   denosumab (PROLIA) injection 60 mg    Review of Systems:  Review of Systems  Constitutional:  Negative for activity change and appetite change.  HENT: Negative.    Respiratory:  Negative for cough and shortness of breath.   Cardiovascular:  Negative for leg swelling.  Gastrointestinal:  Positive for constipation.  Genitourinary: Negative.   Musculoskeletal:  Negative for arthralgias, gait problem and myalgias.  Skin: Negative.   Neurological:  Negative for dizziness and weakness.  Psychiatric/Behavioral:  Positive for dysphoric mood.  Negative for confusion and sleep disturbance.     Health Maintenance  Topic Date Due   Diabetic kidney evaluation - Urine ACR  09/14/2014   Pneumonia Vaccine 88+ Years old (3 of 3 - PPSV23 or PCV20) 12/21/2014   Medicare Annual Wellness (AWV)  06/17/2021   OPHTHALMOLOGY EXAM  02/04/2023   COVID-19 Vaccine (9 - 2024-25 season) 03/04/2023   FOOT EXAM  07/28/2023   HEMOGLOBIN A1C  11/15/2023   Diabetic kidney evaluation - eGFR measurement  05/17/2024   DTaP/Tdap/Td (3 - Td or Tdap) 11/29/2029   INFLUENZA VACCINE  Completed   DEXA SCAN  Completed   Hepatitis C Screening  Completed   Zoster Vaccines- Shingrix  Completed   HPV VACCINES  Aged Out   Colonoscopy  Discontinued  Physical Exam: Vitals:   05/25/23 0840  BP: 124/80  Pulse: 100  Resp: 18  Temp: (!) 97 F (36.1 C)  TempSrc: Temporal  SpO2: 99%  Weight: 120 lb 12.8 oz (54.8 kg)  Height: 5' (1.524 m)   Body mass index is 23.59 kg/m. Physical Exam Vitals reviewed.  Constitutional:      Appearance: Normal appearance.  HENT:     Head: Normocephalic.     Right Ear: Tympanic membrane normal.     Left Ear: Tympanic membrane normal.     Ears:     Comments: Mild wax in Both Ears    Nose: Nose normal.     Mouth/Throat:     Mouth: Mucous membranes are moist.     Pharynx: Oropharynx is clear.  Eyes:     Pupils: Pupils are equal, round, and reactive to light.  Cardiovascular:     Rate and Rhythm: Normal rate and regular rhythm.     Pulses: Normal pulses.     Heart sounds: Normal heart sounds. No murmur heard. Pulmonary:     Effort: Pulmonary effort is normal.     Breath sounds: Normal breath sounds.  Abdominal:     General: Abdomen is flat. Bowel sounds are normal.     Palpations: Abdomen is soft.  Musculoskeletal:        General: No swelling.     Cervical back: Neck supple.  Skin:    General: Skin is warm.  Neurological:     General: No focal deficit present.     Mental Status: She is alert and oriented to  person, place, and time.  Psychiatric:        Mood and Affect: Mood normal.        Thought Content: Thought content normal.     Labs reviewed: Basic Metabolic Panel: Recent Labs    07/21/22 0000 09/21/22 1149 11/17/22 0000 05/18/23 0000  NA 140  --  140 140  K 4.4  --  4.8 4.8  CL 106  --  106 108  CO2 20  --  21 19  BUN 23*  --  20 18  CREATININE 0.9  --  1.2* 0.9  CALCIUM 9.5  --  9.5 9.2  TSH  --  1.310  --   --    Liver Function Tests: Recent Labs    11/17/22 0000  AST 18  ALT 15  ALKPHOS 80  ALBUMIN 4.3   No results for input(s): "LIPASE", "AMYLASE" in the last 8760 hours. No results for input(s): "AMMONIA" in the last 8760 hours. CBC: Recent Labs    11/17/22 0000 05/18/23 0000  WBC 6.1 6.5  NEUTROABS 3.70  --   HGB 12.1 12.3  HCT 36 37  PLT 411* 426*   Lipid Panel: Recent Labs    11/17/22 0000  CHOL 121  HDL 59  LDLCALC 36  TRIG 126   Lab Results  Component Value Date   HGBA1C 7.2 05/18/2023    Procedures since last visit: No results found.  Assessment/Plan 1. Trigger middle finger of right hand (Primary) Referral For Hand surgery  2. Age-related osteoporosis without current pathological fracture Prolia per [facility] Labs in the System  3. Type 2 diabetes mellitus without complication, without long-term current use of insulin (HCC) A1C acceptable for her age Upto date on her Eye exams  4. Mild cognitive impairment Has seen Neurology MRI no Acute issues  Mild ventriculomegaly  Mild Atropy  MMSE 27/30 No New recommendations per  Neurology  5. Depression, major, single episode, severe (HCC) Doing well with Ativan ,Xanax Did not tolerate Taper Sees both Psychologist and therapist  6. Esophageal dysphagia On Nexium 7 HLD Statin 8 Essential hypertension Cozaar   9. Slow transit constipation Add senna Plus   General Health Maintenance -Encourage balance and resistance exercises for osteoporosis management. -Discussed the  benefits of mammograms for early detection of breast cancer. Patient to consider continuing mammograms. -Continue routine eye exams. -Endocrinology follow-up not necessary at this time unless new issues arise. -Follow-up in four months with labs prior to visit.            Labs/tests ordered:  * No order type specified * Next appt:  05/27/2023

## 2023-05-27 ENCOUNTER — Ambulatory Visit (INDEPENDENT_AMBULATORY_CARE_PROVIDER_SITE_OTHER): Payer: Medicare Other

## 2023-05-27 DIAGNOSIS — M810 Age-related osteoporosis without current pathological fracture: Secondary | ICD-10-CM

## 2023-05-27 MED ORDER — DENOSUMAB 60 MG/ML ~~LOC~~ SOSY
60.0000 mg | PREFILLED_SYRINGE | SUBCUTANEOUS | Status: AC
  Administered 2023-11-25: 60 mg via SUBCUTANEOUS

## 2023-05-31 NOTE — Progress Notes (Incomplete)
 Office Visit Note   Patient: Kimberly [name]           Date of Birth: 10-25-1945           MRN: [id number] Visit Date: 06/01/2023              Requested by:  Mahlon Gammon, MD [address] [place],  [place] [address]  PCP: Mahlon Gammon, MD   Assessment & Plan: Visit Diagnoses: No diagnosis found.  Plan: ***  Follow-Up Instructions: No follow-ups on file.   Orders:  No orders of the defined types were placed in this encounter. No orders of the defined types were placed in this encounter.    Procedures: No procedures performed   Clinical Data: No additional findings.   Subjective: No chief complaint on file.  HPI  Review of Systems   Objective: Vital Signs: There were no vitals taken for this visit.  Physical Exam  Ortho Exam  Specialty Comments:  No specialty comments available.  Imaging: No results found.   PMFS History: Patient Active Problem List   Diagnosis Date Noted  . Mild cognitive impairment 07/28/2022  . Gastroesophageal reflux disease without esophagitis 07/28/2022  . Hiatal hernia 09/08/2021  . Depression, major, single episode, severe (HCC) 01/03/2020  . GAD (generalized anxiety disorder) 01/03/2020  . Spondylolisthesis at L4-L5 level 11/07/2018  . Chronic right-sided low back pain with right-sided sciatica 07/21/2018  . Cervical radiculopathy 07/21/2018  . 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 Uncontrolled hypertension 11/22/2016  . Adjustment disorder with anxious mood 02/04/2016  . Hyperlipidemia associated with type 2 diabetes mellitus (HCC) 02/04/2016  . Type 2 diabetes mellitus without complication, without long-term current use of insulin (HCC) 02/04/2016  . Basal cell carcinoma of left nasal sidewall 10/22/2011   Past Medical History:  Diagnosis Date  . Allergic rhinitis   . Allergy   . Anemia    as child   . Anxiety   . Arthritis   . Atrophic vaginitis 2017  . Cancer (HCC)    MOHS nose- skin cancer- basal  cell   . Cataract    removed both eyes   . Depression   . Diabetes mellitus without complication (HCC)   . GERD (gastroesophageal reflux disease)   . Heart murmur    as child- outgrown   . Hematuria 05/09/2015   had two nonobstructing renal calculi  . Hepatitis A 1970s  . Hyperlipidemia   . Hypertension   . Osteopenia   . Osteoporosis   . PONV (postoperative nausea and vomiting)   . Shingles 06/04/2015  . SUI (stress urinary incontinence, female) 2017  . Thrombocytopenia (HCC)     Family History  Problem Relation Age of Onset  . Heart disease Father        heart attack  1980  . Stroke Father        min- stroke  63  . Colon polyps Sister   . Cancer Brother        colon (2004)  . Colon cancer Brother 42  . Esophageal cancer Neg Hx   . Rectal cancer Neg Hx   . Stomach cancer Neg Hx     Past Surgical History:  Procedure Laterality Date  . BACK SURGERY  11/2018  . CATARACT EXTRACTION W/ INTRAOCULAR LENS  IMPLANT, BILATERAL Bilateral   . COLONOSCOPY    . KNEE ARTHROSCOPY Left 2004, 1990s  . TONSILLECTOMY AND ADENOIDECTOMY  1952  . UPPER GASTROINTESTINAL ENDOSCOPY  Social History   Occupational History  . Not on file  Tobacco Use  . Smoking status: Never  . Smokeless tobacco: Never  Vaping Use  . Vaping status: Never Used  Substance and Sexual Activity  . Alcohol use: Never  . Drug use: Never  . Sexual activity: Not Currently

## 2023-06-01 ENCOUNTER — Encounter: Payer: Self-pay | Admitting: Orthopaedic Surgery

## 2023-06-01 ENCOUNTER — Ambulatory Visit: Payer: Medicare Other | Admitting: Orthopaedic Surgery

## 2023-06-01 DIAGNOSIS — M65331 Trigger finger, right middle finger: Secondary | ICD-10-CM | POA: Diagnosis not present

## 2023-06-01 MED ORDER — METHYLPREDNISOLONE ACETATE 40 MG/ML IJ SUSP
13.3300 mg | INTRAMUSCULAR | Status: AC | PRN
  Administered 2023-06-01: 13.33 mg

## 2023-06-01 MED ORDER — LIDOCAINE HCL 1 % IJ SOLN
0.3000 mL | INTRAMUSCULAR | Status: AC | PRN
  Administered 2023-06-01: .3 mL

## 2023-06-01 MED ORDER — BUPIVACAINE HCL 0.5 % IJ SOLN
0.3300 mL | INTRAMUSCULAR | Status: AC | PRN
  Administered 2023-06-01: .33 mL

## 2023-06-07 ENCOUNTER — Ambulatory Visit: Payer: Medicare Other

## 2023-06-08 ENCOUNTER — Encounter: Payer: Medicare Other | Admitting: Internal Medicine

## 2023-07-13 ENCOUNTER — Other Ambulatory Visit: Payer: Self-pay | Admitting: Internal Medicine

## 2023-07-13 DIAGNOSIS — B372 Candidiasis of skin and nail: Secondary | ICD-10-CM

## 2023-07-13 MED ORDER — KETOCONAZOLE 2 % EX CREA: 1.0000 | TOPICAL_CREAM | Freq: Two times a day (BID) | CUTANEOUS | 3 refills | Status: AC | PRN

## 2023-08-04 DIAGNOSIS — M65331 Trigger finger, right middle finger: Secondary | ICD-10-CM | POA: Diagnosis not present

## 2023-09-06 DIAGNOSIS — M19041 Primary osteoarthritis, right hand: Secondary | ICD-10-CM | POA: Diagnosis not present

## 2023-09-06 DIAGNOSIS — M79641 Pain in right hand: Secondary | ICD-10-CM | POA: Diagnosis not present

## 2023-09-13 DIAGNOSIS — M25531 Pain in right wrist: Secondary | ICD-10-CM | POA: Diagnosis not present

## 2023-09-13 DIAGNOSIS — M6281 Muscle weakness (generalized): Secondary | ICD-10-CM | POA: Diagnosis not present

## 2023-09-13 DIAGNOSIS — M19041 Primary osteoarthritis, right hand: Secondary | ICD-10-CM | POA: Diagnosis not present

## 2023-09-13 DIAGNOSIS — M25641 Stiffness of right hand, not elsewhere classified: Secondary | ICD-10-CM | POA: Diagnosis not present

## 2023-09-16 DIAGNOSIS — E559 Vitamin D deficiency, unspecified: Secondary | ICD-10-CM | POA: Diagnosis not present

## 2023-09-16 DIAGNOSIS — E119 Type 2 diabetes mellitus without complications: Secondary | ICD-10-CM | POA: Diagnosis not present

## 2023-09-16 LAB — HEPATIC FUNCTION PANEL
ALT: 19 U/L (ref 7–35)
AST: 18 (ref 13–35)
Alkaline Phosphatase: 66 (ref 25–125)
Bilirubin, Total: 0.2

## 2023-09-16 LAB — COMPREHENSIVE METABOLIC PANEL WITH GFR
Albumin: 4.3 (ref 3.5–5.0)
Calcium: 9.1 (ref 8.7–10.7)
Globulin: 1.8
eGFR: 61

## 2023-09-16 LAB — LIPID PANEL
Cholesterol: 123 (ref 0–200)
HDL: 61 (ref 35–70)
LDL Cholesterol: 37
LDl/HDL Ratio: 2
Triglycerides: 123 (ref 40–160)

## 2023-09-16 LAB — CBC AND DIFFERENTIAL
HCT: 37 (ref 36–46)
Hemoglobin: 12.1 (ref 12.0–16.0)
Platelets: 392 10*3/uL (ref 150–400)
WBC: 6.1

## 2023-09-16 LAB — BASIC METABOLIC PANEL WITH GFR
BUN: 25 — AB (ref 4–21)
CO2: 19 (ref 13–22)
Chloride: 106 (ref 99–108)
Creatinine: 1 (ref 0.5–1.1)
Glucose: 113
Potassium: 4.5 meq/L (ref 3.5–5.1)
Sodium: 141 (ref 137–147)

## 2023-09-16 LAB — CBC: RBC: 4.18 (ref 3.87–5.11)

## 2023-09-16 LAB — HEMOGLOBIN A1C: Hemoglobin A1C: 7.7

## 2023-09-16 LAB — VITAMIN D 25 HYDROXY (VIT D DEFICIENCY, FRACTURES): Vit D, 25-Hydroxy: 29.9

## 2023-09-21 ENCOUNTER — Encounter: Payer: Self-pay | Admitting: Internal Medicine

## 2023-09-21 ENCOUNTER — Non-Acute Institutional Stay: Payer: Medicare Other | Admitting: Internal Medicine

## 2023-09-21 VITALS — BP 138/86 | HR 90 | Temp 98.1°F | Ht 60.0 in | Wt 123.8 lb

## 2023-09-21 DIAGNOSIS — M810 Age-related osteoporosis without current pathological fracture: Secondary | ICD-10-CM | POA: Diagnosis not present

## 2023-09-21 DIAGNOSIS — F322 Major depressive disorder, single episode, severe without psychotic features: Secondary | ICD-10-CM

## 2023-09-21 DIAGNOSIS — K5901 Slow transit constipation: Secondary | ICD-10-CM

## 2023-09-21 DIAGNOSIS — R7989 Other specified abnormal findings of blood chemistry: Secondary | ICD-10-CM | POA: Diagnosis not present

## 2023-09-21 DIAGNOSIS — E119 Type 2 diabetes mellitus without complications: Secondary | ICD-10-CM | POA: Diagnosis not present

## 2023-09-21 NOTE — Patient Instructions (Addendum)
 Senna Plus 1 to 2 tablets at night for constipation Take Vitamin D  2000 units Every day

## 2023-09-21 NOTE — Progress Notes (Unsigned)
 Location:  [facility] features editor of Service:  Clinic (12)  Provider:   Code Status: DNR Goals of Care:     09/21/2023    9:06 AM  Advanced Directives  Does Patient Have a Medical Advance Directive? Yes  Type of Estate agent of [place];Living will;Out of facility DNR (pink MOST or yellow form)  Does patient want to make changes to medical advance directive? No - Patient declined  Copy of Healthcare Power of Attorney in Chart? Yes - validated most recent copy scanned in chart (See row information)     Chief Complaint  Patient presents with   Medical Management of Chronic Issues    Go over labs.    HPI: Patient is a 78 y.o. female seen today for medical management of chronic diseases.    [place]  Trigger finger and Right hand Pain Got injected From Ortho Dr Christiane Cowing Also saw Dr. Lydia Sams in [place] [facility] was told that she has osteoarthritis.  Using Voltaren  gel which seems to help. Diabetes Continues to be stable Anxiety Sees Dr. Levie Ream and therapist  Dysphagia She takes Nexium  for swallowing issues, which are currently well-managed.   Discussed the use of AI scribe software for clinical note transcription with the patient, who gave verbal consent to proceed.  History of Present Illness   Kimberly [name] is a 78 year old female who presents with concerns about memory issues and constipation. Memory issues She experiences memory issues, including forgetting to check her calendar and difficulty retaining new information, but has not missed many events. Has seen  neurologist has ruled out progressive dementia.   Constipation is managed with daily Miralax, following a severe episode that required multiple interventions. Her diet is careful, often skipping lunch and eating salads for dinner, with occasional desserts and limited bread intake  . Anxiety is present, particularly on Father's Day, with a decrease in crying episodes. She  maintains physical activity by walking twice daily with her dog and engages in mental activities like reading and puzzles. She recently completed a book and plans to visit [employer] more often. She does not consume alcohol and has a supportive social network, including three sons.  Current medications include Miralax daily for constipation and Voltaren  gel for hand pain, applied at night with a glove, along with Prolia  injections.      Past Medical History:  Diagnosis Date   Allergic rhinitis    Allergy    Anemia    as child    Anxiety    Arthritis    Atrophic vaginitis 2017   Cancer (HCC)    MOHS nose- skin cancer- basal cell    Cataract    removed both eyes    Depression    Diabetes mellitus without complication (HCC)    GERD (gastroesophageal reflux disease)    Heart murmur    as child- outgrown    Hematuria 05/09/2015   had two nonobstructing renal calculi   Hepatitis A 1970s   Hyperlipidemia    Hypertension    Osteopenia    Osteoporosis    PONV (postoperative nausea and vomiting)    Shingles 06/04/2015   SUI (stress urinary incontinence, female) 2017   Thrombocytopenia (HCC)     Past Surgical History:  Procedure Laterality Date   BACK SURGERY  11/2018   CATARACT EXTRACTION W/ INTRAOCULAR LENS  IMPLANT, BILATERAL Bilateral    COLONOSCOPY     KNEE ARTHROSCOPY Left 2004, 1990s  TONSILLECTOMY AND ADENOIDECTOMY  1952   UPPER GASTROINTESTINAL ENDOSCOPY      Allergies  Allergen Reactions   Keflex [Cephalexin]     rash    Outpatient Encounter Medications as of 09/21/2023  Medication Sig   acetaminophen  (TYLENOL ) 500 MG tablet Take 500 mg by mouth every 6 (six) hours as needed.   ALPRAZolam  (XANAX ) 0.5 MG tablet Take 1 tablet (0.5 mg total) by mouth 3 (three) times daily.   atorvastatin  (LIPITOR) 20 MG tablet Take 1 tablet (20 mg total) by mouth daily.   buPROPion  (WELLBUTRIN  XL) 150 MG 24 hr tablet Take 150 mg by mouth daily.   calcium  carbonate (CALCIUM  600)  600 MG TABS tablet Take 1 tablet (600 mg total) by mouth 2 (two) times daily with a meal.   Cholecalciferol  (VITAMIN D3) 25 MCG (1000 UT) CAPS Take 1 capsule (1,000 Units total) by mouth daily.   dapagliflozin  propanediol (FARXIGA ) 5 MG TABS tablet Take by mouth daily.   esomeprazole  (NEXIUM ) 20 MG capsule Take 1 capsule (20 mg total) by mouth daily at 12 noon.   glucose blood (ONETOUCH VERIO) test strip Use to test blood sugar daily. Dx: E11.9   JARDIANCE  10 MG TABS tablet Take 10 mg by mouth daily.   ketoconazole  (NIZORAL ) 2 % cream Apply 1 Application topically 2 (two) times daily as needed (groin rash).   LORazepam  (ATIVAN ) 0.5 MG tablet Take 1 tablet (0.5 mg total) by mouth in the morning, at noon, and at bedtime.   losartan  (COZAAR ) 100 MG tablet TAKE 1 TABLET BY MOUTH ONCE DAILY   magnesium  oxide (MAG-OX) 400 (240 Mg) MG tablet Take 400 mg by mouth daily.   metFORMIN  (GLUCOPHAGE ) 1000 MG tablet Take 1 tablet (1,000 mg total) by mouth 2 (two) times daily with a meal.   nortriptyline  (PAMELOR ) 25 MG capsule Take 50 mg by mouth at bedtime.   Facility-Administered Encounter Medications as of 09/21/2023  Medication   [START ON 11/25/2023] denosumab  (PROLIA ) injection 60 mg    Review of Systems:  Review of Systems  Constitutional:  Negative for activity change and appetite change.  HENT: Negative.    Respiratory:  Negative for cough and shortness of breath.   Cardiovascular:  Negative for leg swelling.  Gastrointestinal:  Negative for constipation.  Genitourinary: Negative.   Musculoskeletal:  Positive for arthralgias. Negative for gait problem and myalgias.  Skin: Negative.   Neurological:  Negative for dizziness and weakness.  Psychiatric/Behavioral:  Positive for decreased concentration and dysphoric mood. Negative for confusion and sleep disturbance. The patient is nervous/anxious.     Health Maintenance  Topic Date Due   Diabetic kidney evaluation - Urine ACR  08/10/2012    Pneumococcal Vaccine: 50+ Years (3 of 3 - PCV20 or PCV21) 12/21/2018   Medicare Annual Wellness (AWV)  06/17/2021   OPHTHALMOLOGY EXAM  02/04/2023   COVID-19 Vaccine (9 - 2024-25 season) 03/04/2023   FOOT EXAM  07/28/2023   INFLUENZA VACCINE  11/05/2023   HEMOGLOBIN A1C  03/17/2024   Diabetic kidney evaluation - eGFR measurement  09/15/2024   DTaP/Tdap/Td (3 - Td or Tdap) 11/29/2029   DEXA SCAN  Completed   Hepatitis C Screening  Completed   Zoster Vaccines- Shingrix  Completed   HPV VACCINES  Aged Out   Meningococcal B Vaccine  Aged Out   Colonoscopy  Discontinued    Physical Exam: Vitals:   09/21/23 0903 09/21/23 0915  BP: (!) 140/86 138/86  Pulse: 90   Temp: 98.1 F (  36.7 C)   SpO2: 97%   Weight: 123 lb 12.8 oz (56.2 kg)   Height: 5' (1.524 m)    Body mass index is 24.18 kg/m. Physical Exam Vitals reviewed.  Constitutional:      Appearance: Normal appearance.  HENT:     Head: Normocephalic.     Nose: Nose normal.     Mouth/Throat:     Mouth: Mucous membranes are moist.     Pharynx: Oropharynx is clear.   Eyes:     Pupils: Pupils are equal, round, and reactive to light.    Cardiovascular:     Rate and Rhythm: Normal rate and regular rhythm.     Pulses: Normal pulses.     Heart sounds: Normal heart sounds. No murmur heard. Pulmonary:     Effort: Pulmonary effort is normal.     Breath sounds: Normal breath sounds.  Abdominal:     General: Abdomen is flat. Bowel sounds are normal.     Palpations: Abdomen is soft.   Musculoskeletal:        General: No swelling.     Cervical back: Neck supple.   Skin:    General: Skin is warm.   Neurological:     General: No focal deficit present.     Mental Status: She is alert and oriented to person, place, and time.   Psychiatric:        Mood and Affect: Mood normal.        Thought Content: Thought content normal.     Labs reviewed: Basic Metabolic Panel: Recent Labs    09/21/22 1149 11/17/22 0000  05/18/23 0000 09/16/23 0000  NA  --  140 140 141  K  --  4.8 4.8 4.5  CL  --  106 108 106  CO2  --  21 19 19   BUN  --  20 18 25*  CREATININE  --  1.2* 0.9 1.0  CALCIUM   --  9.5 9.2 9.1  TSH 1.310  --   --   --    Liver Function Tests: Recent Labs    11/17/22 0000 09/16/23 0000  AST 18 18  ALT 15 19  ALKPHOS 80 66  ALBUMIN 4.3 4.3   No results for input(s): LIPASE, AMYLASE in the last 8760 hours. No results for input(s): AMMONIA in the last 8760 hours. CBC: Recent Labs    11/17/22 0000 05/18/23 0000 09/16/23 0000  WBC 6.1 6.5 6.1  NEUTROABS 3.70  --   --   HGB 12.1 12.3 12.1  HCT 36 37 37  PLT 411* 426* 392   Lipid Panel: Recent Labs    11/17/22 0000 09/16/23 0000  CHOL 121 123  HDL 59 61  LDLCALC 36 37  TRIG 126 123   Lab Results  Component Value Date   HGBA1C 7.7 09/16/2023    Procedures since last visit: No results found.  Assessment/Plan 1. Type 2 diabetes mellitus without complication, without long-term current use of insulin (HCC) (Primary) A1C stable On Metformin    2. Age-related osteoporosis without current pathological fracture Prolia  per [facility]  3. Depression, major, single episode, severe (HCC) Doing well with Ativan  ,Xanax  Did not tolerate Taper Sees both Psychologist and therapis  4. Slow transit constipation Chronic constipation managed with Miralax, recent episode resolved. - Continue daily Miralax as needed. - Consider Senna Plus at night if needed. - Use prune juice and dietary fiber.  5. Low vitamin D  level Restart Vit D supplement   6  Memory  concerns Memory issues consistent with normal aging, no progressive dementia. - Use a calendar for daily activities. - Engage in cognitive-stimulating activities. - Maintain regular physical activity.  7 General Health Maintenance Discussion on mammograms continuation past-80, no family history of breast cancer. - Consider continuing mammograms based on preference and  health. - Discuss further at next visit.    8 Hand Arthritis Voltaren  PRN   Esophageal dysphagia On Nexium   HLD Statin  Essential hypertension Cozaar   insomnia Pamelor  Labs/tests ordered:  * No order type specified * Next appt:  11/25/2023

## 2023-10-19 ENCOUNTER — Other Ambulatory Visit: Payer: Self-pay | Admitting: Internal Medicine

## 2023-10-19 DIAGNOSIS — M1711 Unilateral primary osteoarthritis, right knee: Secondary | ICD-10-CM | POA: Diagnosis not present

## 2023-10-19 DIAGNOSIS — M25552 Pain in left hip: Secondary | ICD-10-CM | POA: Diagnosis not present

## 2023-10-19 MED ORDER — LORAZEPAM 0.5 MG PO TABS: 0.5000 mg | ORAL_TABLET | Freq: Three times a day (TID) | ORAL | 3 refills | Status: AC

## 2023-11-02 ENCOUNTER — Telehealth: Admitting: *Deleted

## 2023-11-02 NOTE — Telephone Encounter (Signed)
 Verification Received from [employer] for Prolia  Copay 20% + $30  $351 PA APPROVED through [facility] 11/02/2023-11/01/2024 Auth #: [id number]

## 2023-11-10 DIAGNOSIS — M1712 Unilateral primary osteoarthritis, left knee: Secondary | ICD-10-CM | POA: Diagnosis not present

## 2023-11-15 NOTE — Telephone Encounter (Signed)
 Copied from CRM #[id number]. Topic: Clinical - Medical Advice >> Nov 15, 2023 10:25 AM Zane F wrote: Reason for CRM:   Patient is looking to confirm whether she is required to submit blood work prior to her prolia  injection scheduled for 11/25/2023 at 11:15am  Callback Number: [id number]

## 2023-11-15 NOTE — Telephone Encounter (Signed)
 Patient needs bloodwork ordered and an appointment at [facility] for labs prior to her Prolia  injection. Patient stated that she can come next Monday to have drawn there at [facility].

## 2023-11-16 NOTE — Telephone Encounter (Signed)
 I have ordered Blood Work

## 2023-11-17 DIAGNOSIS — M1712 Unilateral primary osteoarthritis, left knee: Secondary | ICD-10-CM | POA: Diagnosis not present

## 2023-11-23 DIAGNOSIS — E559 Vitamin D deficiency, unspecified: Secondary | ICD-10-CM | POA: Diagnosis not present

## 2023-11-23 DIAGNOSIS — E119 Type 2 diabetes mellitus without complications: Secondary | ICD-10-CM | POA: Diagnosis not present

## 2023-11-23 LAB — VITAMIN D 25 HYDROXY (VIT D DEFICIENCY, FRACTURES): Vit D, 25-Hydroxy: 32.9

## 2023-11-23 LAB — BASIC METABOLIC PANEL WITH GFR
BUN: 21 (ref 4–21)
CO2: 22 (ref 13–22)
Chloride: 105 (ref 99–108)
Creatinine: 1 (ref 0.5–1.1)
Glucose: 147
Potassium: 4.6 meq/L (ref 3.5–5.1)
Sodium: 141 (ref 137–147)

## 2023-11-23 LAB — COMPREHENSIVE METABOLIC PANEL WITH GFR
Albumin: 4.1 (ref 3.5–5.0)
Calcium: 9 (ref 8.7–10.7)
Globulin: 1.8
eGFR: 55

## 2023-11-23 LAB — HEPATIC FUNCTION PANEL
ALT: 18 U/L (ref 7–35)
AST: 15 (ref 13–35)
Alkaline Phosphatase: 67 (ref 25–125)

## 2023-11-24 DIAGNOSIS — M1712 Unilateral primary osteoarthritis, left knee: Secondary | ICD-10-CM | POA: Diagnosis not present

## 2023-11-25 ENCOUNTER — Ambulatory Visit (INDEPENDENT_AMBULATORY_CARE_PROVIDER_SITE_OTHER): Payer: Medicare Other | Admitting: Orthopedic Surgery

## 2023-11-25 DIAGNOSIS — M810 Age-related osteoporosis without current pathological fracture: Secondary | ICD-10-CM

## 2023-11-25 MED ORDER — DENOSUMAB 60 MG/ML ~~LOC~~ SOSY: 60.0000 mg | PREFILLED_SYRINGE | SUBCUTANEOUS | Status: AC

## 2023-11-25 NOTE — Progress Notes (Signed)
 Patient is in office today for a nurse visit for  Prolia injection . Patient Injection was given in the  Left arm. Patient tolerated injection well.

## 2023-12-15 DIAGNOSIS — M19032 Primary osteoarthritis, left wrist: Secondary | ICD-10-CM | POA: Diagnosis not present

## 2023-12-30 ENCOUNTER — Other Ambulatory Visit: Payer: Self-pay | Admitting: Adult Health

## 2023-12-30 MED ORDER — LOSARTAN POTASSIUM 100 MG PO TABS: 50.0000 mg | ORAL_TABLET | Freq: Two times a day (BID) | ORAL | Status: DC

## 2024-01-20 DIAGNOSIS — I10 Essential (primary) hypertension: Secondary | ICD-10-CM | POA: Diagnosis not present

## 2024-01-20 DIAGNOSIS — E119 Type 2 diabetes mellitus without complications: Secondary | ICD-10-CM | POA: Diagnosis not present

## 2024-01-20 LAB — BASIC METABOLIC PANEL WITH GFR
BUN: 23 — AB (ref 4–21)
CO2: 21 (ref 13–22)
Chloride: 108 (ref 99–108)
Creatinine: 1 (ref 0.5–1.1)
Glucose: 115
Potassium: 4.8 meq/L (ref 3.5–5.1)
Sodium: 141 (ref 137–147)

## 2024-01-20 LAB — LAB REPORT - SCANNED
A1c: 7.3
EGFR: 59
TSH: 2.5

## 2024-01-20 LAB — CBC AND DIFFERENTIAL
HCT: 36 (ref 36–46)
Hemoglobin: 11.9 — AB (ref 12.0–16.0)
Platelets: 412 K/uL — AB (ref 150–400)
WBC: 6

## 2024-01-20 LAB — COMPREHENSIVE METABOLIC PANEL WITH GFR
Calcium: 8.7 (ref 8.7–10.7)
eGFR: 59

## 2024-01-20 LAB — HEMOGLOBIN A1C: Hemoglobin A1C: 7.3

## 2024-01-20 LAB — TSH: TSH: 2.5 (ref 0.41–5.90)

## 2024-01-20 LAB — CBC: RBC: 4.11 (ref 3.87–5.11)

## 2024-01-25 ENCOUNTER — Non-Acute Institutional Stay: Admitting: Internal Medicine

## 2024-01-25 ENCOUNTER — Encounter: Payer: Self-pay | Admitting: Internal Medicine

## 2024-01-25 VITALS — BP 118/82 | HR 80 | Temp 98.2°F | Ht 60.0 in | Wt 121.4 lb

## 2024-01-25 DIAGNOSIS — I10 Essential (primary) hypertension: Secondary | ICD-10-CM | POA: Diagnosis not present

## 2024-01-25 DIAGNOSIS — E785 Hyperlipidemia, unspecified: Secondary | ICD-10-CM | POA: Diagnosis not present

## 2024-01-25 DIAGNOSIS — E1169 Type 2 diabetes mellitus with other specified complication: Secondary | ICD-10-CM

## 2024-01-25 DIAGNOSIS — R7989 Other specified abnormal findings of blood chemistry: Secondary | ICD-10-CM

## 2024-01-25 DIAGNOSIS — F5101 Primary insomnia: Secondary | ICD-10-CM | POA: Diagnosis not present

## 2024-01-25 DIAGNOSIS — K5901 Slow transit constipation: Secondary | ICD-10-CM | POA: Diagnosis not present

## 2024-01-25 DIAGNOSIS — F322 Major depressive disorder, single episode, severe without psychotic features: Secondary | ICD-10-CM | POA: Diagnosis not present

## 2024-01-25 DIAGNOSIS — K219 Gastro-esophageal reflux disease without esophagitis: Secondary | ICD-10-CM

## 2024-01-25 DIAGNOSIS — E119 Type 2 diabetes mellitus without complications: Secondary | ICD-10-CM

## 2024-01-25 DIAGNOSIS — M810 Age-related osteoporosis without current pathological fracture: Secondary | ICD-10-CM

## 2024-01-25 DIAGNOSIS — G3184 Mild cognitive impairment, so stated: Secondary | ICD-10-CM | POA: Diagnosis not present

## 2024-01-25 NOTE — Progress Notes (Unsigned)
 Location:  [facility] features editor of Service:  Clinic (12)  Provider:   Code Status:  Goals of Care:     09/21/2023    9:06 AM  Advanced Directives  Does Patient Have a Medical Advance Directive? Yes  Type of Estate agent of [name];Living will;Out of facility DNR (pink MOST or yellow form)  Does patient want to make changes to medical advance directive? No - Patient declined  Copy of Healthcare Power of Attorney in Chart? Yes - validated most recent copy scanned in chart (See row information)     Chief Complaint  Patient presents with  . Follow-up    4 month follow up Patient would like discuss her A1c    HPI: Patient is a 78 y.o. female seen today for medical management of chronic diseases.    [place] in [place] Trigger finger and Right hand Pain Got injected From Ortho Kimberly [name] Also saw Kimberly [name] in [place] [facility] was told that she has osteoarthritis.  Using Voltaren  gel which seems to help. Diabetes Continues to be stable Anxiety Sees Kimberly. Tasia and therapist  Dysphagia She takes Nexium  for swallowing issues, which are currently well-managed.   Discussed the use of AI scribe software for clinical note transcription with the patient, who gave verbal consent to proceed.  History of Present Illness   Kimberly [name] is an 78 year old female with diabetes and arthritis who presents for a follow-up visit.  Her diabetes management has improved, with A1c decreasing from 8.8 to 7.3. She follows a strict diet and takes metformin  and Jardiance , having discontinued Farxiga .  She experiences severe arthritis, especially in her knees, and has received five injections over the past three months. Despite treatment, she has ongoing pain in one knee and occasionally takes ibuprofen  for relief.  She has tingling in her toes, which started after back surgery, but no pain in her feet while sleeping.  She lives in a community setting and uses  [facility] for transportation, though she finds it unsatisfactory. She engages in social activities like playing the piano and dining at a friendship table. She is up to date with flu and COVID vaccines and is not driving as advised by her neurologist. She has thin skin and easy bruising, attributed to age and medication use.      Past Medical History:  Diagnosis Date  . Allergic rhinitis   . Allergy   . Anemia    as child   . Anxiety   . Arthritis   . Atrophic vaginitis 2017  . Cancer (HCC)    MOHS nose- skin cancer- basal cell   . Cataract    removed both eyes   . Depression   . Diabetes mellitus without complication (HCC)   . GERD (gastroesophageal reflux disease)   . Heart murmur    as child- outgrown   . Hematuria 05/09/2015   had two nonobstructing renal calculi  . Hepatitis A 1970s  . Hyperlipidemia   . Hypertension   . Osteopenia   . Osteoporosis   . PONV (postoperative nausea and vomiting)   . Shingles 06/04/2015  . SUI (stress urinary incontinence, female) 2017  . Thrombocytopenia     Past Surgical History:  Procedure Laterality Date  . BACK SURGERY  11/2018  . CATARACT EXTRACTION W/ INTRAOCULAR LENS  IMPLANT, BILATERAL Bilateral   . COLONOSCOPY    . KNEE ARTHROSCOPY Left 2004, 1990s  . TONSILLECTOMY AND ADENOIDECTOMY  1952  .  UPPER GASTROINTESTINAL ENDOSCOPY      Allergies  Allergen Reactions  . Keflex [Cephalexin]     rash    Outpatient Encounter Medications as of 01/25/2024  Medication Sig  . acetaminophen  (TYLENOL ) 500 MG tablet Take 500 mg by mouth every 6 (six) hours as needed.  . ALPRAZolam  (XANAX ) 0.5 MG tablet Take 0.5 mg by mouth 2 (two) times daily.  . atorvastatin  (LIPITOR) 20 MG tablet Take 1 tablet (20 mg total) by mouth daily.  . buPROPion  (WELLBUTRIN  XL) 150 MG 24 hr tablet Take 150 mg by mouth daily.  . calcium  carbonate (CALCIUM  600) 600 MG TABS tablet Take 1 tablet (600 mg total) by mouth 2 (two) times daily with a meal.  .  Cholecalciferol  (VITAMIN D3) 25 MCG (1000 UT) CAPS Take 1 capsule (1,000 Units total) by mouth daily.  . esomeprazole  (NEXIUM ) 20 MG capsule Take 1 capsule (20 mg total) by mouth daily at 12 noon.  SABRA glucose blood (ONETOUCH VERIO) test strip Use to test blood sugar daily. Dx: E11.9  . JARDIANCE  10 MG TABS tablet Take 10 mg by mouth daily.  . ketoconazole  (NIZORAL ) 2 % cream Apply 1 Application topically 2 (two) times daily as needed (groin rash).  . LORazepam  (ATIVAN ) 0.5 MG tablet Take 1 tablet (0.5 mg total) by mouth in the morning, at noon, and at bedtime.  . losartan  (COZAAR ) 100 MG tablet Take 50 mg by mouth daily.  . magnesium  oxide (MAG-OX) 400 (240 Mg) MG tablet Take 400 mg by mouth daily.  . metFORMIN  (GLUCOPHAGE ) 1000 MG tablet Take 1 tablet (1,000 mg total) by mouth 2 (two) times daily with a meal.  . nortriptyline  (PAMELOR ) 25 MG capsule Take 50 mg by mouth at bedtime.  . polyethylene glycol (MIRALAX / GLYCOLAX) 17 g packet Take 17 g by mouth daily as needed for moderate constipation.  . [DISCONTINUED] dapagliflozin  propanediol (FARXIGA ) 5 MG TABS tablet Take by mouth daily. (Patient not taking: Reported on 01/24/2024)  . [DISCONTINUED] losartan  (COZAAR ) 100 MG tablet Take 0.5 tablets (50 mg total) by mouth 2 (two) times daily. (Patient not taking: Reported on 01/24/2024)   Facility-Administered Encounter Medications as of 01/25/2024  Medication  . [START ON 04/28/2024] denosumab  (PROLIA ) injection 60 mg    Review of Systems:  Review of Systems  Constitutional:  Negative for activity change and appetite change.  HENT: Negative.    Respiratory:  Negative for cough and shortness of breath.   Cardiovascular:  Negative for leg swelling.  Gastrointestinal:  Negative for constipation.  Genitourinary: Negative.   Musculoskeletal:  Negative for arthralgias, gait problem and myalgias.  Skin: Negative.   Neurological:  Negative for dizziness and weakness.  Psychiatric/Behavioral:   Positive for dysphoric mood. Negative for confusion and sleep disturbance. The patient is nervous/anxious.     Health Maintenance  Topic Date Due  . Diabetic kidney evaluation - Urine ACR  08/10/2012  . Pneumococcal Vaccine: 50+ Years (3 of 3 - PCV20 or PCV21) 12/21/2018  . Medicare Annual Wellness (AWV)  06/17/2021  . OPHTHALMOLOGY EXAM  02/04/2023  . FOOT EXAM  07/28/2023  . COVID-19 Vaccine (10 - Moderna risk 2025-26 season) 07/03/2024  . HEMOGLOBIN A1C  07/20/2024  . Diabetic kidney evaluation - eGFR measurement  01/19/2025  . DTaP/Tdap/Td (3 - Td or Tdap) 11/29/2029  . Influenza Vaccine  Completed  . DEXA SCAN  Completed  . Zoster Vaccines- Shingrix  Completed  . Meningococcal B Vaccine  Aged Out  . Mammogram  Discontinued  . Colonoscopy  Discontinued  . Hepatitis C Screening  Discontinued    Physical Exam: Vitals:   01/25/24 0844  BP: 118/82  Pulse: 80  Temp: 98.2 F (36.8 C)  SpO2: 98%  Weight: 121 lb 6.4 oz (55.1 kg)  Height: 5' (1.524 m)   Body mass index is 23.71 kg/m. Physical Exam Vitals reviewed.  Constitutional:      Appearance: Normal appearance.  HENT:     Head: Normocephalic.     Right Ear: Tympanic membrane normal.     Left Ear: Tympanic membrane normal.     Nose: Nose normal.     Mouth/Throat:     Mouth: Mucous membranes are moist.     Pharynx: Oropharynx is clear.  Eyes:     Pupils: Pupils are equal, round, and reactive to light.  Cardiovascular:     Rate and Rhythm: Normal rate and regular rhythm.     Pulses: Normal pulses.     Heart sounds: Normal heart sounds. No murmur heard. Pulmonary:     Effort: Pulmonary effort is normal.     Breath sounds: Normal breath sounds.  Abdominal:     General: Abdomen is flat. Bowel sounds are normal.     Palpations: Abdomen is soft.  Musculoskeletal:        General: No swelling.     Cervical back: Neck supple.  Skin:    General: Skin is warm.  Neurological:     General: No focal deficit present.      Mental Status: She is alert and oriented to person, place, and time.  Psychiatric:        Mood and Affect: Mood normal.        Thought Content: Thought content normal.     Labs reviewed: Basic Metabolic Panel: Recent Labs    09/16/23 0000 11/23/23 0000 11/23/23 1226 01/20/24 0000 01/20/24 1303  NA 141 141  --  141  --   K 4.5 4.6  --  4.8  --   CL 106 105  --  108  --   CO2 19 22  --  21  --   BUN 25* 21  --  23*  --   CREATININE 1.0 1.0  --  1.0  --   CALCIUM  9.1  --  9.0 8.7  --   TSH  --   --   --  2.50 2.50   Liver Function Tests: Recent Labs    09/16/23 0000 11/23/23 0000 11/23/23 1226  AST 18 15  --   ALT 19 18  --   ALKPHOS 66 67  --   ALBUMIN 4.3  --  4.1   No results for input(s): LIPASE, AMYLASE in the last 8760 hours. No results for input(s): AMMONIA in the last 8760 hours. CBC: Recent Labs    05/18/23 0000 09/16/23 0000 01/20/24 0000  WBC 6.5 6.1 6.0  HGB 12.3 12.1 11.9*  HCT 37 37 36  PLT 426* 392 412*   Lipid Panel: Recent Labs    09/16/23 0000  CHOL 123  HDL 61  LDLCALC 37  TRIG 123   Lab Results  Component Value Date   HGBA1C 7.3 01/20/2024    Procedures since last visit: No results found.  Assessment/Plan 1. Type 2 diabetes mellitus without complication, without long-term current use of insulin (HCC) (Primary) ***  2. Age-related osteoporosis without current pathological fracture ***  3. Depression, major, single episode, severe (HCC) ***  4. Low vitamin D   level ***    Labs/tests ordered:  * No order type specified * Next appt:  05/23/2024

## 2024-01-25 NOTE — Patient Instructions (Signed)
 You are due for PCV 20 Can get from any Pharmacy

## 2024-01-26 DIAGNOSIS — M810 Age-related osteoporosis without current pathological fracture: Secondary | ICD-10-CM | POA: Insufficient documentation

## 2024-01-26 DIAGNOSIS — K5901 Slow transit constipation: Secondary | ICD-10-CM | POA: Insufficient documentation

## 2024-02-07 ENCOUNTER — Encounter: Payer: Self-pay | Admitting: Radiology

## 2024-02-14 LAB — OPHTHALMOLOGY REPORT-SCANNED

## 2024-02-23 ENCOUNTER — Encounter: Payer: Self-pay | Admitting: *Deleted

## 2024-02-23 NOTE — Progress Notes (Signed)
 YALONDA SAMPLE                                          MRN: [id number]   02/23/2024   The [facility] Quality Team Specialist reviewed this patient medical record for the purposes of chart review for care gap closure. The following were reviewed: chart review for care gap closure-kidney health evaluation for diabetes:eGFR  and uACR.    [facility] Quality Team

## 2024-03-09 ENCOUNTER — Other Ambulatory Visit: Payer: Self-pay | Admitting: Adult Health

## 2024-03-09 MED ORDER — DOXEPIN HCL 10 MG PO CAPS: 10.0000 mg | ORAL_CAPSULE | Freq: Every day | ORAL | Status: AC

## 2024-03-21 ENCOUNTER — Encounter: Payer: Self-pay | Admitting: Internal Medicine

## 2024-03-21 ENCOUNTER — Non-Acute Institutional Stay: Admitting: Internal Medicine

## 2024-03-21 VITALS — BP 122/80 | HR 108 | Temp 97.2°F | Ht 60.0 in | Wt 123.6 lb

## 2024-03-21 DIAGNOSIS — Z7984 Long term (current) use of oral hypoglycemic drugs: Secondary | ICD-10-CM | POA: Diagnosis not present

## 2024-03-21 DIAGNOSIS — F322 Major depressive disorder, single episode, severe without psychotic features: Secondary | ICD-10-CM | POA: Diagnosis not present

## 2024-03-21 DIAGNOSIS — R4189 Other symptoms and signs involving cognitive functions and awareness: Secondary | ICD-10-CM

## 2024-03-21 DIAGNOSIS — E119 Type 2 diabetes mellitus without complications: Secondary | ICD-10-CM | POA: Diagnosis not present

## 2024-03-21 MED ORDER — DONEPEZIL HCL 5 MG PO TABS: 5.0000 mg | ORAL_TABLET | Freq: Every day | ORAL | 0 refills | Status: DC

## 2024-03-21 NOTE — Progress Notes (Signed)
 Location:  [facility]   Place of Service:   Clinic  Provider:   Code Status:  Goals of Care:     09/21/2023    9:06 AM  Advanced Directives  Does Patient Have a Medical Advance Directive? Yes  Type of Estate Agent of [place];Living will;Out of facility DNR (pink MOST or yellow form)  Does patient want to make changes to medical advance directive? No - Patient declined  Copy of Healthcare Power of Attorney in Chart? Yes - validated most recent copy scanned in chart (See row information)     Chief Complaint  Patient presents with   Medical Management of Chronic Issues    Would like to discuss medication for memory     HPI: Patient is a 78 y.o. female seen today for an acute visit for Confusion  [place] in [place]  Discussed the use of AI scribe software for clinical note transcription with the patient, who gave verbal consent to proceed.  History of Present Illness   Kimberly [name] is an 78 year old female who presents with memory concerns and cognitive difficulties.  She has increasing difficulty recalling information and telling stories in conversation. About 18 months ago a neurologist recommended memory medication, which she declined at that time. She now feels her memory problems have worsened and is willing to start medication.   She recently injured herself while going up steps. The injury is healing and is being monitored by a clinic nurse.       Past Medical History:  Diagnosis Date   Allergic rhinitis    Allergy    Anemia    as child    Anxiety    Arthritis    Atrophic vaginitis 2017   Cancer (HCC)    MOHS nose- skin cancer- basal cell    Cataract    removed both eyes    Depression    Diabetes mellitus without complication (HCC)    GERD (gastroesophageal reflux disease)    Heart murmur    as child- outgrown    Hematuria 05/09/2015   had two nonobstructing renal calculi   Hepatitis A 1970s   Hyperlipidemia    Hypertension     Osteopenia    Osteoporosis    PONV (postoperative nausea and vomiting)    Shingles 06/04/2015   SUI (stress urinary incontinence, female) 2017   Thrombocytopenia     Past Surgical History:  Procedure Laterality Date   BACK SURGERY  11/2018   CATARACT EXTRACTION W/ INTRAOCULAR LENS  IMPLANT, BILATERAL Bilateral    COLONOSCOPY     KNEE ARTHROSCOPY Left 2004, 1990s   TONSILLECTOMY AND ADENOIDECTOMY  1952   UPPER GASTROINTESTINAL ENDOSCOPY      Allergies[1]  Outpatient Encounter Medications as of 03/21/2024  Medication Sig   acetaminophen  (TYLENOL ) 500 MG tablet Take 500 mg by mouth every 6 (six) hours as needed.   ALPRAZolam  (XANAX ) 0.5 MG tablet Take 0.5 mg by mouth 2 (two) times daily.   atorvastatin  (LIPITOR) 20 MG tablet Take 1 tablet (20 mg total) by mouth daily.   buPROPion  (WELLBUTRIN  XL) 150 MG 24 hr tablet Take 150 mg by mouth daily.   calcium  carbonate (CALCIUM  600) 600 MG TABS tablet Take 1 tablet (600 mg total) by mouth 2 (two) times daily with a meal.   Cholecalciferol  (VITAMIN D3) 25 MCG (1000 UT) CAPS Take 1 capsule (1,000 Units total) by mouth daily.   donepezil  (ARICEPT ) 5 MG tablet Take 1 tablet (  5 mg total) by mouth at bedtime.   doxepin  (SINEQUAN ) 10 MG capsule Take 1 capsule (10 mg total) by mouth at bedtime. Started by Dr Tasia   esomeprazole  (NEXIUM ) 20 MG capsule Take 1 capsule (20 mg total) by mouth daily at 12 noon.   glucose blood (ONETOUCH VERIO) test strip Use to test blood sugar daily. Dx: E11.9   JARDIANCE  10 MG TABS tablet Take 10 mg by mouth daily.   ketoconazole  (NIZORAL ) 2 % cream Apply 1 Application topically 2 (two) times daily as needed (groin rash).   LORazepam  (ATIVAN ) 0.5 MG tablet Take 1 tablet (0.5 mg total) by mouth in the morning, at noon, and at bedtime.   losartan  (COZAAR ) 100 MG tablet Take 50 mg by mouth daily.   magnesium  oxide (MAG-OX) 400 (240 Mg) MG tablet Take 400 mg by mouth daily.   metFORMIN  (GLUCOPHAGE ) 1000 MG tablet  Take 1 tablet (1,000 mg total) by mouth 2 (two) times daily with a meal.   nortriptyline  (PAMELOR ) 25 MG capsule Take 50 mg by mouth at bedtime.   polyethylene glycol (MIRALAX / GLYCOLAX) 17 g packet Take 17 g by mouth daily as needed for moderate constipation.   Facility-Administered Encounter Medications as of 03/21/2024  Medication   [START ON 04/28/2024] denosumab  (PROLIA ) injection 60 mg    Review of Systems:  Review of Systems  Constitutional:  Negative for activity change and appetite change.  HENT: Negative.    Respiratory:  Negative for cough and shortness of breath.   Cardiovascular:  Negative for leg swelling.  Gastrointestinal:  Negative for constipation.  Genitourinary: Negative.   Musculoskeletal:  Negative for arthralgias, gait problem and myalgias.  Skin: Negative.   Neurological:  Negative for dizziness and weakness.  Psychiatric/Behavioral:  Positive for confusion. Negative for dysphoric mood and sleep disturbance. The patient is nervous/anxious.     Health Maintenance  Topic Date Due   Diabetic kidney evaluation - Urine ACR  08/10/2012   Pneumococcal Vaccine: 50+ Years (3 of 3 - PCV20 or PCV21) 12/21/2018   Medicare Annual Wellness (AWV)  06/17/2021   COVID-19 Vaccine (10 - Moderna risk 2025-26 season) 07/03/2024   HEMOGLOBIN A1C  07/20/2024   Diabetic kidney evaluation - eGFR measurement  01/19/2025   FOOT EXAM  01/24/2025   OPHTHALMOLOGY EXAM  02/13/2025   DTaP/Tdap/Td (3 - Td or Tdap) 11/29/2029   Influenza Vaccine  Completed   Bone Density Scan  Completed   Zoster Vaccines- Shingrix  Completed   Meningococcal B Vaccine  Aged Out   Mammogram  Discontinued   Colonoscopy  Discontinued   Hepatitis C Screening  Discontinued    Physical Exam: There were no vitals filed for this visit. There is no height or weight on file to calculate BMI. Physical Exam Vitals reviewed.  Constitutional:      Appearance: Normal appearance.  HENT:     Head:  Normocephalic.  Musculoskeletal:        General: No swelling.  Skin:    General: Skin is warm and dry.  Neurological:     Mental Status: She is alert.  Psychiatric:        Behavior: Behavior normal.     Comments: Anxious     Labs reviewed: Basic Metabolic Panel: Recent Labs    09/16/23 0000 11/23/23 0000 11/23/23 1226 01/20/24 0000 01/20/24 1303  NA 141 141  --  141  --   K 4.5 4.6  --  4.8  --   CL 106  105  --  108  --   CO2 19 22  --  21  --   BUN 25* 21  --  23*  --   CREATININE 1.0 1.0  --  1.0  --   CALCIUM  9.1  --  9.0 8.7  --   TSH  --   --   --  2.50 2.50   Liver Function Tests: Recent Labs    09/16/23 0000 11/23/23 0000 11/23/23 1226  AST 18 15  --   ALT 19 18  --   ALKPHOS 66 67  --   ALBUMIN 4.3  --  4.1   No results for input(s): LIPASE, AMYLASE in the last 8760 hours. No results for input(s): AMMONIA in the last 8760 hours. CBC: Recent Labs    05/18/23 0000 09/16/23 0000 01/20/24 0000  WBC 6.5 6.1 6.0  HGB 12.3 12.1 11.9*  HCT 37 37 36  PLT 426* 392 412*   Lipid Panel: Recent Labs    09/16/23 0000  CHOL 123  HDL 61  LDLCALC 37  TRIG 123   Lab Results  Component Value Date   HGBA1C 7.3 01/20/2024    Procedures since last visit: No results found.  Assessment/Plan 1. 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      [1]  Allergies Allergen Reactions   Keflex  [Cephalexin]     rash

## 2024-04-25 ENCOUNTER — Encounter: Admitting: Internal Medicine

## 2024-04-25 ENCOUNTER — Encounter: Payer: Self-pay | Admitting: Internal Medicine

## 2024-04-25 ENCOUNTER — Non-Acute Institutional Stay: Admitting: Internal Medicine

## 2024-04-25 VITALS — BP 130/88 | HR 93 | Temp 97.2°F | Ht 60.0 in | Wt 123.8 lb

## 2024-04-25 DIAGNOSIS — F322 Major depressive disorder, single episode, severe without psychotic features: Secondary | ICD-10-CM | POA: Diagnosis not present

## 2024-04-25 DIAGNOSIS — R4189 Other symptoms and signs involving cognitive functions and awareness: Secondary | ICD-10-CM | POA: Diagnosis not present

## 2024-04-25 DIAGNOSIS — E119 Type 2 diabetes mellitus without complications: Secondary | ICD-10-CM | POA: Diagnosis not present

## 2024-04-25 DIAGNOSIS — Z7984 Long term (current) use of oral hypoglycemic drugs: Secondary | ICD-10-CM

## 2024-04-25 DIAGNOSIS — M810 Age-related osteoporosis without current pathological fracture: Secondary | ICD-10-CM | POA: Diagnosis not present

## 2024-04-25 MED ORDER — DONEPEZIL HCL 10 MG PO TABS: 10.0000 mg | ORAL_TABLET | Freq: Every day | ORAL | 3 refills | Status: AC

## 2024-04-25 MED ORDER — GLIMEPIRIDE 1 MG PO TABS: 1.0000 mg | ORAL_TABLET | Freq: Every day | ORAL | 3 refills | Status: AC

## 2024-04-25 NOTE — Progress Notes (Signed)
 "  Location: [facility] Service:  Clinic (12)  Provider:   Code Status:  Goals of Care:     09/21/2023    9:06 AM  Advanced Directives  Does Patient Have a Medical Advance Directive? Yes  Type of Estate Agent of [place];Living will;Out of facility DNR (pink MOST or yellow form)  Does patient want to make changes to medical advance directive? No - Patient declined  Copy of Healthcare Power of Attorney in Chart? Yes - validated most recent copy scanned in chart (See row information)     Chief Complaint  Patient presents with   Discuss Medications    HPI: Patient is a 79 y.o. female seen today for an acute visit for  Medication Discussion  Type 2 Diabetes She does not want to take Jardiance  more as it is costing her $200 a month  Cognitive impairment She wants to know if I can increase  her Aricept  to 10 mg  She initially did have diarrhea but says its now resolved  Anxiety Had panic attack few weeks ago when her son was sick with flu But otherwise doing well Follows with Dr Tasia  [place] in [place]  Dr. Tobie in [facility] was told that she has osteoarthritis.  Using Voltaren  gel which seems to help. Diabetes  Anxiety Sees Dr. Tasia and therapist   Dysphagia She takes Nexium  for swallowing issues, which are currently well-managed.  Past Medical History:  Diagnosis Date   Allergic rhinitis    Allergy    Anemia    as child    Anxiety    Arthritis    Atrophic vaginitis 2017   Cancer (HCC)    MOHS nose- skin cancer- basal cell    Cataract    removed both eyes    Depression    Diabetes mellitus without complication (HCC)    GERD (gastroesophageal reflux disease)    Heart murmur    as child- outgrown    Hematuria 05/09/2015   had two nonobstructing renal calculi   Hepatitis A 1970s   Hyperlipidemia    Hypertension    Osteopenia    Osteoporosis    PONV (postoperative nausea and vomiting)     Shingles 06/04/2015   SUI (stress urinary incontinence, female) 2017   Thrombocytopenia     Past Surgical History:  Procedure Laterality Date   BACK SURGERY  11/2018   CATARACT EXTRACTION W/ INTRAOCULAR LENS  IMPLANT, BILATERAL Bilateral    COLONOSCOPY     KNEE ARTHROSCOPY Left 2004, 1990s   TONSILLECTOMY AND ADENOIDECTOMY  1952   UPPER GASTROINTESTINAL ENDOSCOPY      Allergies[1]  Outpatient Encounter Medications as of 04/25/2024  Medication Sig   acetaminophen  (TYLENOL ) 500 MG tablet Take 500 mg by mouth every 6 (six) hours as needed.   ALPRAZolam  (XANAX ) 0.5 MG tablet Take 0.5 mg by mouth 2 (two) times daily.   atorvastatin  (LIPITOR) 20 MG tablet Take 1 tablet (20 mg total) by mouth daily.   buPROPion  (WELLBUTRIN  XL) 150 MG 24 hr tablet Take 150 mg by mouth daily.   calcium  carbonate (CALCIUM  600) 600 MG TABS tablet Take 1 tablet (600 mg total) by mouth 2 (two) times daily with a meal.   Cholecalciferol  (VITAMIN D3) 25 MCG (1000 UT) CAPS Take 1 capsule (1,000 Units total) by mouth daily.   donepezil  (ARICEPT ) 5 MG tablet Take 1 tablet (5 mg total) by mouth at bedtime.   doxepin  (SINEQUAN )  10 MG capsule Take 1 capsule (10 mg total) by mouth at bedtime. Started by Dr Tasia   esomeprazole  (NEXIUM ) 20 MG capsule Take 1 capsule (20 mg total) by mouth daily at 12 noon.   glucose blood (ONETOUCH VERIO) test strip Use to test blood sugar daily. Dx: E11.9   ketoconazole  (NIZORAL ) 2 % cream Apply 1 Application topically 2 (two) times daily as needed (groin rash).   LORazepam  (ATIVAN ) 0.5 MG tablet Take 1 tablet (0.5 mg total) by mouth in the morning, at noon, and at bedtime.   losartan  (COZAAR ) 100 MG tablet Take 50 mg by mouth daily.   magnesium  oxide (MAG-OX) 400 (240 Mg) MG tablet Take 400 mg by mouth daily.   metFORMIN  (GLUCOPHAGE ) 1000 MG tablet Take 1 tablet (1,000 mg total) by mouth 2 (two) times daily with a meal.   nortriptyline  (PAMELOR ) 25 MG capsule Take 50 mg by mouth at  bedtime.   polyethylene glycol (MIRALAX / GLYCOLAX) 17 g packet Take 17 g by mouth daily as needed for moderate constipation.   JARDIANCE  10 MG TABS tablet Take 10 mg by mouth daily. (Patient not taking: Reported on 04/25/2024)   Facility-Administered Encounter Medications as of 04/25/2024  Medication   [START ON 04/28/2024] denosumab  (PROLIA ) injection 60 mg    Review of Systems:  Review of Systems  Constitutional:  Negative for activity change and appetite change.  HENT: Negative.    Respiratory:  Negative for cough and shortness of breath.   Cardiovascular:  Negative for leg swelling.  Gastrointestinal:  Negative for constipation.  Genitourinary: Negative.   Musculoskeletal:  Negative for arthralgias, gait problem and myalgias.  Skin: Negative.   Neurological:  Negative for dizziness and weakness.  Psychiatric/Behavioral:  Positive for dysphoric mood. Negative for confusion and sleep disturbance.     Health Maintenance  Topic Date Due   Diabetic kidney evaluation - Urine ACR  08/10/2012   Pneumococcal Vaccine: 50+ Years (3 of 3 - PCV20 or PCV21) 12/21/2018   Medicare Annual Wellness (AWV)  06/17/2021   COVID-19 Vaccine (10 - Moderna risk 2025-26 season) 07/03/2024   HEMOGLOBIN A1C  07/20/2024   Diabetic kidney evaluation - eGFR measurement  01/19/2025   FOOT EXAM  01/24/2025   OPHTHALMOLOGY EXAM  02/13/2025   DTaP/Tdap/Td (3 - Td or Tdap) 11/29/2029   Influenza Vaccine  Completed   Bone Density Scan  Completed   Zoster Vaccines- Shingrix  Completed   Meningococcal B Vaccine  Aged Out   Mammogram  Discontinued   Colonoscopy  Discontinued   Hepatitis C Screening  Discontinued    Physical Exam: Vitals:   04/25/24 1050  BP: 130/88  Pulse: 93  Temp: (!) 97.2 F (36.2 C)  SpO2: 97%  Weight: 123 lb 12.8 oz (56.2 kg)  Height: 5' (1.524 m)   Body mass index is 24.18 kg/m. Physical Exam Vitals reviewed.  Constitutional:      Appearance: Normal appearance.  HENT:      Head: Normocephalic.     Nose: Nose normal.     Mouth/Throat:     Mouth: Mucous membranes are moist.     Pharynx: Oropharynx is clear.  Eyes:     Pupils: Pupils are equal, round, and reactive to light.  Cardiovascular:     Rate and Rhythm: Normal rate and regular rhythm.     Pulses: Normal pulses.     Heart sounds: Normal heart sounds. No murmur heard. Pulmonary:     Effort: Pulmonary effort is normal.  Breath sounds: Normal breath sounds.  Abdominal:     General: Abdomen is flat. Bowel sounds are normal.     Palpations: Abdomen is soft.  Musculoskeletal:        General: No swelling.     Cervical back: Neck supple.  Skin:    General: Skin is warm.  Neurological:     General: No focal deficit present.     Mental Status: She is alert and oriented to person, place, and time.  Psychiatric:        Mood and Affect: Mood normal.        Thought Content: Thought content normal.     Labs reviewed: Basic Metabolic Panel: Recent Labs    09/16/23 0000 11/23/23 0000 11/23/23 1226 01/20/24 0000 01/20/24 1303  NA 141 141  --  141  --   K 4.5 4.6  --  4.8  --   CL 106 105  --  108  --   CO2 19 22  --  21  --   BUN 25* 21  --  23*  --   CREATININE 1.0 1.0  --  1.0  --   CALCIUM  9.1  --  9.0 8.7  --   TSH  --   --   --  2.50 2.50   Liver Function Tests: Recent Labs    09/16/23 0000 11/23/23 0000 11/23/23 1226  AST 18 15  --   ALT 19 18  --   ALKPHOS 66 67  --   ALBUMIN 4.3  --  4.1   No results for input(s): LIPASE, AMYLASE in the last 8760 hours. No results for input(s): AMMONIA in the last 8760 hours. CBC: Recent Labs    05/18/23 0000 09/16/23 0000 01/20/24 0000  WBC 6.5 6.1 6.0  HGB 12.3 12.1 11.9*  HCT 37 37 36  PLT 426* 392 412*   Lipid Panel: Recent Labs    09/16/23 0000  CHOL 123  HDL 61  LDLCALC 37  TRIG 123   Lab Results  Component Value Date   HGBA1C 7.3 01/20/2024    Procedures since last visit: No results  found.  Assessment/Plan 1. Type 2 diabetes mellitus without complication, without long-term current use of insulin (HCC) (Primary) D/W Pharmacy Any other drug will cost her $200/month She wants to try generic amaryl   2. 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 Depression, major, single episode, severe (HCC) On Pamelor , Wellbutrin  and Xanax  Per Dr Tasia  4. Age-related osteoporosis without current pathological fracture Prolia  per [facility]    Labs/tests ordered:  * No order type specified * Next appt:  05/23/2024     [1]  Allergies Allergen Reactions   Keflex [Cephalexin]     rash   "

## 2024-04-26 ENCOUNTER — Telehealth: Payer: Self-pay | Admitting: *Deleted

## 2024-04-26 NOTE — Telephone Encounter (Signed)
 Received Prolia  [employer] Verification  20% Copay + $40 Admin Fee= $361 Need Prior Auth through [facility], will obtain.

## 2024-04-27 NOTE — Telephone Encounter (Addendum)
 PA APPROVED through [facility] 11/02/2023-11/01/2024 Auth #: [id number] (Auth is scanned into patient's chart)

## 2024-05-23 ENCOUNTER — Encounter: Payer: Self-pay | Admitting: Internal Medicine

## 2024-05-23 ENCOUNTER — Encounter: Admitting: Internal Medicine

## 2024-05-29 ENCOUNTER — Ambulatory Visit: Payer: Self-pay
